# Patient Record
Sex: Female | Born: 1970 | Hispanic: Yes | Marital: Married | Smoking: Never smoker
Health system: Southern US, Community
[De-identification: ages and names within clinical notes are randomized; demographics above are authoritative.]

## PROBLEM LIST (undated history)

## (undated) DIAGNOSIS — G932 Benign intracranial hypertension: Secondary | ICD-10-CM

## (undated) DIAGNOSIS — R7611 Nonspecific reaction to tuberculin skin test without active tuberculosis: Secondary | ICD-10-CM

## (undated) DIAGNOSIS — H538 Other visual disturbances: Secondary | ICD-10-CM

## (undated) DIAGNOSIS — E079 Disorder of thyroid, unspecified: Secondary | ICD-10-CM

## (undated) DIAGNOSIS — B977 Papillomavirus as the cause of diseases classified elsewhere: Secondary | ICD-10-CM

## (undated) DIAGNOSIS — A64 Unspecified sexually transmitted disease: Secondary | ICD-10-CM

## (undated) DIAGNOSIS — C539 Malignant neoplasm of cervix uteri, unspecified: Secondary | ICD-10-CM

## (undated) DIAGNOSIS — K5792 Diverticulitis of intestine, part unspecified, without perforation or abscess without bleeding: Secondary | ICD-10-CM

## (undated) DIAGNOSIS — R896 Abnormal cytological findings in specimens from other organs, systems and tissues: Secondary | ICD-10-CM

## (undated) HISTORY — DX: Abnormal cytological findings in specimens from other organs, systems and tissues: R89.6

## (undated) HISTORY — DX: Diverticulitis of intestine, part unspecified, without perforation or abscess without bleeding: K57.92

## (undated) HISTORY — DX: Unspecified sexually transmitted disease: A64

## (undated) HISTORY — DX: Benign intracranial hypertension: G93.2

## (undated) HISTORY — DX: Other visual disturbances: H53.8

## (undated) HISTORY — DX: Malignant neoplasm of cervix uteri, unspecified: C53.9

## (undated) HISTORY — DX: Disorder of thyroid, unspecified: E07.9

## (undated) HISTORY — DX: Morbid (severe) obesity due to excess calories: E66.01

## (undated) HISTORY — DX: Nonspecific reaction to tuberculin skin test without active tuberculosis: R76.11

## (undated) HISTORY — DX: Papillomavirus as the cause of diseases classified elsewhere: B97.7

---

## 1999-12-16 ENCOUNTER — Emergency Department (HOSPITAL_COMMUNITY): Admission: EM | Admit: 1999-12-16 | Discharge: 1999-12-17 | Payer: Self-pay

## 1999-12-28 ENCOUNTER — Encounter: Admission: RE | Admit: 1999-12-28 | Discharge: 1999-12-28 | Payer: Self-pay | Admitting: Family Medicine

## 1999-12-28 ENCOUNTER — Encounter: Payer: Self-pay | Admitting: Family Medicine

## 2000-04-26 ENCOUNTER — Encounter: Admission: RE | Admit: 2000-04-26 | Discharge: 2000-04-26 | Payer: Self-pay | Admitting: Family Medicine

## 2000-04-26 ENCOUNTER — Encounter: Payer: Self-pay | Admitting: Family Medicine

## 2000-05-23 ENCOUNTER — Encounter: Payer: Self-pay | Admitting: Family Medicine

## 2000-05-23 ENCOUNTER — Ambulatory Visit (HOSPITAL_COMMUNITY): Admission: RE | Admit: 2000-05-23 | Discharge: 2000-05-23 | Payer: Self-pay | Admitting: Family Medicine

## 2000-06-22 HISTORY — PX: CHOLECYSTECTOMY: SHX55

## 2000-06-27 ENCOUNTER — Emergency Department (HOSPITAL_COMMUNITY): Admission: EM | Admit: 2000-06-27 | Discharge: 2000-06-27 | Payer: Self-pay | Admitting: Emergency Medicine

## 2000-06-27 ENCOUNTER — Encounter: Payer: Self-pay | Admitting: Emergency Medicine

## 2000-07-10 ENCOUNTER — Encounter: Payer: Self-pay | Admitting: Family Medicine

## 2000-07-10 ENCOUNTER — Encounter: Admission: RE | Admit: 2000-07-10 | Discharge: 2000-07-10 | Payer: Self-pay | Admitting: Family Medicine

## 2000-07-11 ENCOUNTER — Encounter: Payer: Self-pay | Admitting: General Surgery

## 2000-07-12 ENCOUNTER — Encounter: Payer: Self-pay | Admitting: General Surgery

## 2000-07-12 ENCOUNTER — Ambulatory Visit (HOSPITAL_COMMUNITY): Admission: RE | Admit: 2000-07-12 | Discharge: 2000-07-13 | Payer: Self-pay | Admitting: General Surgery

## 2000-07-12 ENCOUNTER — Encounter (INDEPENDENT_AMBULATORY_CARE_PROVIDER_SITE_OTHER): Payer: Self-pay | Admitting: *Deleted

## 2000-07-26 ENCOUNTER — Encounter: Admission: RE | Admit: 2000-07-26 | Discharge: 2000-07-26 | Payer: Self-pay | Admitting: Sports Medicine

## 2000-08-13 ENCOUNTER — Encounter: Admission: RE | Admit: 2000-08-13 | Discharge: 2000-08-13 | Payer: Self-pay | Admitting: Family Medicine

## 2000-09-30 ENCOUNTER — Ambulatory Visit (HOSPITAL_COMMUNITY): Admission: RE | Admit: 2000-09-30 | Discharge: 2000-09-30 | Payer: Self-pay | Admitting: Gastroenterology

## 2000-10-20 ENCOUNTER — Emergency Department (HOSPITAL_COMMUNITY): Admission: EM | Admit: 2000-10-20 | Discharge: 2000-10-20 | Payer: Self-pay | Admitting: Emergency Medicine

## 2000-10-20 ENCOUNTER — Encounter: Payer: Self-pay | Admitting: Emergency Medicine

## 2000-11-01 ENCOUNTER — Emergency Department (HOSPITAL_COMMUNITY): Admission: EM | Admit: 2000-11-01 | Discharge: 2000-11-02 | Payer: Self-pay | Admitting: Emergency Medicine

## 2000-11-01 ENCOUNTER — Encounter: Payer: Self-pay | Admitting: Emergency Medicine

## 2000-11-21 ENCOUNTER — Other Ambulatory Visit: Admission: RE | Admit: 2000-11-21 | Discharge: 2000-11-21 | Payer: Self-pay | Admitting: *Deleted

## 2001-01-18 ENCOUNTER — Inpatient Hospital Stay (HOSPITAL_COMMUNITY): Admission: AD | Admit: 2001-01-18 | Discharge: 2001-01-18 | Payer: Self-pay | Admitting: Gynecology

## 2001-01-20 ENCOUNTER — Encounter (INDEPENDENT_AMBULATORY_CARE_PROVIDER_SITE_OTHER): Payer: Self-pay

## 2001-01-20 ENCOUNTER — Other Ambulatory Visit: Admission: RE | Admit: 2001-01-20 | Discharge: 2001-01-20 | Payer: Self-pay | Admitting: Gynecology

## 2001-06-18 ENCOUNTER — Emergency Department (HOSPITAL_COMMUNITY): Admission: EM | Admit: 2001-06-18 | Discharge: 2001-06-18 | Payer: Self-pay

## 2001-12-29 ENCOUNTER — Other Ambulatory Visit: Admission: RE | Admit: 2001-12-29 | Discharge: 2001-12-29 | Payer: Self-pay | Admitting: Gynecology

## 2002-04-18 ENCOUNTER — Encounter: Payer: Self-pay | Admitting: Sports Medicine

## 2002-04-18 ENCOUNTER — Encounter: Admission: RE | Admit: 2002-04-18 | Discharge: 2002-04-18 | Payer: Self-pay | Admitting: Sports Medicine

## 2002-06-11 ENCOUNTER — Encounter: Admission: RE | Admit: 2002-06-11 | Discharge: 2002-06-11 | Payer: Self-pay | Admitting: Family Medicine

## 2002-06-11 ENCOUNTER — Encounter: Payer: Self-pay | Admitting: Family Medicine

## 2002-12-29 ENCOUNTER — Other Ambulatory Visit: Admission: RE | Admit: 2002-12-29 | Discharge: 2002-12-29 | Payer: Self-pay | Admitting: Gynecology

## 2003-01-20 ENCOUNTER — Ambulatory Visit (HOSPITAL_COMMUNITY): Admission: RE | Admit: 2003-01-20 | Discharge: 2003-01-20 | Payer: Self-pay | Admitting: Gynecology

## 2003-01-20 ENCOUNTER — Encounter: Payer: Self-pay | Admitting: Gynecology

## 2003-07-24 HISTORY — PX: TUBAL LIGATION: SHX77

## 2003-12-14 ENCOUNTER — Ambulatory Visit (HOSPITAL_BASED_OUTPATIENT_CLINIC_OR_DEPARTMENT_OTHER): Admission: RE | Admit: 2003-12-14 | Discharge: 2003-12-14 | Payer: Self-pay | Admitting: Surgery

## 2003-12-14 ENCOUNTER — Ambulatory Visit (HOSPITAL_COMMUNITY): Admission: RE | Admit: 2003-12-14 | Discharge: 2003-12-14 | Payer: Self-pay | Admitting: Surgery

## 2003-12-14 ENCOUNTER — Encounter (INDEPENDENT_AMBULATORY_CARE_PROVIDER_SITE_OTHER): Payer: Self-pay | Admitting: Specialist

## 2004-01-25 ENCOUNTER — Other Ambulatory Visit: Admission: RE | Admit: 2004-01-25 | Discharge: 2004-01-25 | Payer: Self-pay | Admitting: Gynecology

## 2004-01-27 ENCOUNTER — Ambulatory Visit (HOSPITAL_BASED_OUTPATIENT_CLINIC_OR_DEPARTMENT_OTHER): Admission: RE | Admit: 2004-01-27 | Discharge: 2004-01-27 | Payer: Self-pay | Admitting: Gynecology

## 2004-01-27 ENCOUNTER — Ambulatory Visit (HOSPITAL_COMMUNITY): Admission: RE | Admit: 2004-01-27 | Discharge: 2004-01-27 | Payer: Self-pay | Admitting: Gynecology

## 2004-01-27 ENCOUNTER — Encounter (INDEPENDENT_AMBULATORY_CARE_PROVIDER_SITE_OTHER): Payer: Self-pay | Admitting: *Deleted

## 2004-10-30 ENCOUNTER — Encounter: Admission: RE | Admit: 2004-10-30 | Discharge: 2005-01-28 | Payer: Self-pay | Admitting: Family Medicine

## 2005-02-21 ENCOUNTER — Other Ambulatory Visit: Admission: RE | Admit: 2005-02-21 | Discharge: 2005-02-21 | Payer: Self-pay | Admitting: Gynecology

## 2005-07-04 ENCOUNTER — Encounter: Admission: RE | Admit: 2005-07-04 | Discharge: 2005-07-04 | Payer: Self-pay | Admitting: Family Medicine

## 2005-10-30 ENCOUNTER — Emergency Department (HOSPITAL_COMMUNITY): Admission: EM | Admit: 2005-10-30 | Discharge: 2005-10-30 | Payer: Self-pay | Admitting: Emergency Medicine

## 2005-12-12 ENCOUNTER — Emergency Department (HOSPITAL_COMMUNITY): Admission: EM | Admit: 2005-12-12 | Discharge: 2005-12-13 | Payer: Self-pay | Admitting: Emergency Medicine

## 2006-05-03 ENCOUNTER — Other Ambulatory Visit: Admission: RE | Admit: 2006-05-03 | Discharge: 2006-05-03 | Payer: Self-pay | Admitting: Gynecology

## 2006-05-16 ENCOUNTER — Ambulatory Visit: Payer: Self-pay | Admitting: Family Medicine

## 2006-05-16 LAB — CONVERTED CEMR LAB
ALT: 23 units/L (ref 0–40)
AST: 19 units/L (ref 0–37)
CO2: 27 meq/L (ref 19–32)
Chol/HDL Ratio, serum: 4.7
Cholesterol: 191 mg/dL (ref 0–200)
Creatinine, Ser: 0.6 mg/dL (ref 0.4–1.2)
LDL Cholesterol: 128 mg/dL — ABNORMAL HIGH (ref 0–99)
Total Bilirubin: 0.7 mg/dL (ref 0.3–1.2)
VLDL: 23 mg/dL (ref 0–40)

## 2006-06-05 ENCOUNTER — Ambulatory Visit: Payer: Self-pay | Admitting: Internal Medicine

## 2006-07-23 HISTORY — PX: LAPAROSCOPIC GASTRIC BANDING: SHX1100

## 2007-01-13 ENCOUNTER — Ambulatory Visit: Payer: Self-pay | Admitting: Internal Medicine

## 2007-01-14 ENCOUNTER — Encounter: Payer: Self-pay | Admitting: Internal Medicine

## 2007-02-03 ENCOUNTER — Ambulatory Visit: Payer: Self-pay | Admitting: Family Medicine

## 2007-02-03 ENCOUNTER — Telehealth (INDEPENDENT_AMBULATORY_CARE_PROVIDER_SITE_OTHER): Payer: Self-pay | Admitting: *Deleted

## 2007-02-03 DIAGNOSIS — Z9884 Bariatric surgery status: Secondary | ICD-10-CM | POA: Insufficient documentation

## 2007-02-03 DIAGNOSIS — E559 Vitamin D deficiency, unspecified: Secondary | ICD-10-CM | POA: Insufficient documentation

## 2007-02-03 LAB — CONVERTED CEMR LAB
Basophils Absolute: 0.1 10*3/uL (ref 0.0–0.1)
Basophils Relative: 1.2 % — ABNORMAL HIGH (ref 0.0–1.0)
Eosinophils Absolute: 0.1 10*3/uL (ref 0.0–0.6)
Folate: 12.3 ng/mL
Hemoglobin: 13.5 g/dL (ref 12.0–15.0)
Lymphocytes Relative: 29 % (ref 12.0–46.0)
MCHC: 33.9 g/dL (ref 30.0–36.0)
Magnesium: 2.1 mg/dL (ref 1.5–2.5)
Monocytes Relative: 4.5 % (ref 3.0–11.0)
Neutro Abs: 5.1 10*3/uL (ref 1.4–7.7)
Neutrophils Relative %: 64.3 % (ref 43.0–77.0)
Platelets: 270 10*3/uL (ref 150–400)
TSH: 3.26 microintl units/mL (ref 0.35–5.50)

## 2007-02-07 ENCOUNTER — Telehealth (INDEPENDENT_AMBULATORY_CARE_PROVIDER_SITE_OTHER): Payer: Self-pay | Admitting: *Deleted

## 2007-03-12 ENCOUNTER — Telehealth (INDEPENDENT_AMBULATORY_CARE_PROVIDER_SITE_OTHER): Payer: Self-pay | Admitting: *Deleted

## 2007-03-13 ENCOUNTER — Ambulatory Visit: Payer: Self-pay | Admitting: Family Medicine

## 2007-03-19 ENCOUNTER — Telehealth (INDEPENDENT_AMBULATORY_CARE_PROVIDER_SITE_OTHER): Payer: Self-pay | Admitting: Family Medicine

## 2007-04-07 ENCOUNTER — Other Ambulatory Visit: Admission: RE | Admit: 2007-04-07 | Discharge: 2007-04-07 | Payer: Self-pay | Admitting: Gynecology

## 2007-06-09 ENCOUNTER — Ambulatory Visit: Payer: Self-pay | Admitting: Family Medicine

## 2008-01-26 ENCOUNTER — Ambulatory Visit: Payer: Self-pay | Admitting: Internal Medicine

## 2008-01-26 DIAGNOSIS — S99929A Unspecified injury of unspecified foot, initial encounter: Secondary | ICD-10-CM

## 2008-01-26 DIAGNOSIS — S99919A Unspecified injury of unspecified ankle, initial encounter: Secondary | ICD-10-CM

## 2008-01-26 DIAGNOSIS — S8990XA Unspecified injury of unspecified lower leg, initial encounter: Secondary | ICD-10-CM | POA: Insufficient documentation

## 2008-04-16 ENCOUNTER — Other Ambulatory Visit: Admission: RE | Admit: 2008-04-16 | Discharge: 2008-04-16 | Payer: Self-pay | Admitting: Gynecology

## 2008-04-16 ENCOUNTER — Ambulatory Visit: Payer: Self-pay | Admitting: Gynecology

## 2008-04-16 ENCOUNTER — Encounter: Payer: Self-pay | Admitting: Gynecology

## 2008-05-24 ENCOUNTER — Ambulatory Visit: Payer: Self-pay | Admitting: Gynecology

## 2008-05-27 ENCOUNTER — Encounter: Admission: RE | Admit: 2008-05-27 | Discharge: 2008-05-27 | Payer: Self-pay | Admitting: Gynecology

## 2008-06-08 ENCOUNTER — Encounter: Admission: RE | Admit: 2008-06-08 | Discharge: 2008-06-08 | Payer: Self-pay | Admitting: Gynecology

## 2008-06-09 ENCOUNTER — Ambulatory Visit: Payer: Self-pay | Admitting: Internal Medicine

## 2008-06-09 LAB — CONVERTED CEMR LAB: Vit D, 1,25-Dihydroxy: 10 — ABNORMAL LOW (ref 30–89)

## 2008-06-15 LAB — CONVERTED CEMR LAB
Basophils Relative: 0 % (ref 0.0–3.0)
Chloride: 104 meq/L (ref 96–112)
Cholesterol: 199 mg/dL (ref 0–200)
Creatinine, Ser: 0.6 mg/dL (ref 0.4–1.2)
GFR calc non Af Amer: 120 mL/min
Glucose, Bld: 69 mg/dL — ABNORMAL LOW (ref 70–99)
HCT: 40.7 % (ref 36.0–46.0)
Hemoglobin: 13.8 g/dL (ref 12.0–15.0)
LDL Cholesterol: 123 mg/dL — ABNORMAL HIGH (ref 0–99)
Monocytes Absolute: 0.1 10*3/uL (ref 0.1–1.0)
Monocytes Relative: 1.6 % — ABNORMAL LOW (ref 3.0–12.0)
Neutrophils Relative %: 71 % (ref 43.0–77.0)
Platelets: 244 10*3/uL (ref 150–400)
TSH: 3.45 microintl units/mL (ref 0.35–5.50)
Triglycerides: 138 mg/dL (ref 0–149)
Vitamin B-12: 516 pg/mL (ref 211–911)
WBC: 8.8 10*3/uL (ref 4.5–10.5)

## 2008-07-12 ENCOUNTER — Ambulatory Visit: Payer: Self-pay | Admitting: Family Medicine

## 2008-07-12 DIAGNOSIS — H6690 Otitis media, unspecified, unspecified ear: Secondary | ICD-10-CM | POA: Insufficient documentation

## 2008-10-06 ENCOUNTER — Other Ambulatory Visit: Admission: RE | Admit: 2008-10-06 | Discharge: 2008-10-06 | Payer: Self-pay | Admitting: Gynecology

## 2008-10-06 ENCOUNTER — Encounter: Payer: Self-pay | Admitting: Gynecology

## 2008-10-06 ENCOUNTER — Ambulatory Visit: Payer: Self-pay | Admitting: Gynecology

## 2009-05-27 ENCOUNTER — Ambulatory Visit: Payer: Self-pay | Admitting: Internal Medicine

## 2009-07-23 HISTORY — PX: SALPINGECTOMY: SHX328

## 2009-10-24 ENCOUNTER — Ambulatory Visit: Payer: Self-pay | Admitting: Gynecology

## 2010-05-29 ENCOUNTER — Ambulatory Visit: Payer: Self-pay | Admitting: Women's Health

## 2010-06-05 ENCOUNTER — Ambulatory Visit: Payer: Self-pay | Admitting: Gynecology

## 2010-06-06 ENCOUNTER — Other Ambulatory Visit: Payer: Self-pay | Admitting: Gynecology

## 2010-06-06 ENCOUNTER — Ambulatory Visit (HOSPITAL_COMMUNITY): Admission: RE | Admit: 2010-06-06 | Discharge: 2010-06-06 | Payer: Self-pay | Admitting: Gynecology

## 2010-06-06 ENCOUNTER — Ambulatory Visit: Payer: Self-pay | Admitting: Gynecology

## 2010-06-20 ENCOUNTER — Ambulatory Visit: Payer: Self-pay | Admitting: Gynecology

## 2010-08-01 ENCOUNTER — Ambulatory Visit
Admission: RE | Admit: 2010-08-01 | Discharge: 2010-08-01 | Payer: Self-pay | Source: Home / Self Care | Attending: Gynecology | Admitting: Gynecology

## 2010-08-13 ENCOUNTER — Encounter: Payer: Self-pay | Admitting: Family Medicine

## 2010-08-13 ENCOUNTER — Encounter: Payer: Self-pay | Admitting: Gynecology

## 2010-10-03 LAB — CBC
MCV: 95.9 fL (ref 78.0–100.0)
Platelets: 250 10*3/uL (ref 150–400)
RDW: 13.8 % (ref 11.5–15.5)

## 2010-10-03 LAB — SURGICAL PCR SCREEN
MRSA, PCR: NEGATIVE
Staphylococcus aureus: NEGATIVE

## 2010-10-03 LAB — ABO/RH: ABO/RH(D): O POS

## 2010-12-08 NOTE — Op Note (Signed)
NAME:  Samantha Smith, Samantha Smith                        ACCOUNT NO.:  [id number]   MEDICAL RECORD NO.:  [id number]                   PATIENT TYPE:  AMB   LOCATION:  [facility]                                 FACILITY:  [facility]   PHYSICIAN:  Timothy P. Fontaine, M.D.           DATE OF BIRTH:  August 14, 1970   DATE OF PROCEDURE:  01/27/2004  DATE OF DISCHARGE:                                 OPERATIVE REPORT   PREOPERATIVE DIAGNOSES:  1. Pelvic adhesive disease.  2. Right hydrosalpinx.   POSTOPERATIVE DIAGNOSES:  1. Chronic pelvic inflammatory disease.  2. Bilateral adnexal adhesive disease.  3. Right hydrosalpinx.   PROCEDURES:  1. Laparoscopic lysis of adhesions.  2. Excision, left adnexal inflammatory cyst.  3. Bipolar cautery, proximal right fallopian tube.  4. Lysis of adhesions.   SURGEON:  Timothy P. Fontaine, M.D.   ASSISTANT:  Rande Brunt. Gottsegen, M.D.   ESTIMATED BLOOD LOSS:  Minimal.   COMPLICATIONS:  None.   SPECIMENS:  Inflammatory cysts.   FINDINGS:  Anterior cul-de-sac normal.  Posterior cul-de-sac normal.  Uterus  grossly normal size, shape, contour.  Right fallopian tube distended,  consistent with hydrosalpinx, distal one-half encased in thick adnexal  adhesions to the sidewall.  Ovary contained within this adhesive mass.  No  ovary visualized.  Left fallopian tube normal length and caliber, adherent  to the left pelvic sidewall.  Prohibited distal third inspection.  Manipulation did reveal tuft of normal-appearing fimbriae from the distal  portion.  Ovary obscured by the fallopian tube not visualized.  Two areas of  inflammatory cysts noted, which were excised.  No evidence of endometriosis.  Upper abdominal exam is normal.  Appendix attempted visualization  unsuccessful.  Liver smooth, no abnormalities, no perihepatic adhesions.  Gallbladder not visualized.   PROCEDURE:  The patient was taken to the operating room, underwent general  endotracheal anesthesia, was placed  in the low dorsal lithotomy position and  received an abdominal, perineal, and vaginal preparation with Betadine  solution, the bladder emptied with in-and-out Foley catheterization, EUA  performed, and a Hulka tenaculum was placed on the cervix.  The patient was  draped in the usual fashion.  The vertical infraumbilical incision was made  and several attempts to place the Veress needle were made with high pressure  readings returned.  It was felt that we were extraperitoneal.  The  subcutaneous tissues were dissected with a hemostat to the level of the  fascia and using the 10 mm laparoscopic trocar, a direct entry was made into  the peritoneal cavity, which was verified visually, and subsequently the  peritoneal cavity was insufflated.  Right and left suprapubic 5 mm ports  were then placed under direct visualization without difficulty.  Transillumination of the vessels pre-placement was not possible due to the  subcutaneous adipose tissue.  Examination of the pelvic organs and upper  abdominal exam were carried out with findings noted above.  Initial  inspection of  the left adnexa showed firm adherence to the left pelvic  sidewall.  There were two obvious inflammatory cysts emanating from the  sidewall, and both of these were excised and sent to pathology.  Further  attempts to free the adnexa were difficult due to the thick adhesions as  well as the case was hampered from a technical nature due to the  preperitoneal adipose tissue, epiploica, and omental abundance, and the  patient was not able to be placed in significant Trendelenburg due to  difficulty with ventilation from anesthesia when she was in initial attempts  at Trendelenburg.  Further attempts to free the left adnexa were abandoned.  Although we were unable to visualize the ovary, we were able to visualize  normal-appearing areas and tufts of fimbriate, and it was felt that further  dissection not only would put underlying  structures such as vessels and  ureter at risk but would potentially lead to further adhesion and scarring  on this side.  Inspection of the right adnexa showed an obvious right  hydrosalpinx, the distal portion of which was involved in an inflammatory  mass along the right pelvic sidewall.  The ovary was again not visualized,  and there was epiploica adherent to the inflammatory mass.  Initial attempts  were made to initiate lysis of adhesions to free up the right adnexa to  allow ovarian inspection as well as possible salpingectomy, but it was  quickly evident that the adhesive disease was significant and the risk of  sidewall structure injury to include vessel or ureter would be substantial  if dissection was pursued.  At this point it was decided to proceed with  proximal tubal obstruction with bipolar and abandon attempts at  salpingectomy or salpingo-oophorectomy.  This possibility had been  previously discussed with the patient and her husband, and both were in  agreement that if significant dissection would be required that they would  agree with a proximal tubal cauterization.  The proximal right fallopian  tube was then bipolar cauterized in several passes to a flow of 0.  The  pelvis was then copiously irrigated, adequate hemostasis was visualized, the  suprapubic ports were removed, the gas allowed to escape, all port sites  inspected under low-pressure situation showing adequate hemostasis.  The  infraumbilical port was then backed out under direct visualization, showing  adequate hemostasis and no evidence of hernia formation.  A 0 Vicryl  subcutaneous stitch was placed for closure of the dead space and  subsequently all skin incisions were infiltrated using 0.25% Marcaine and  closed with Dermabond skin adhesive.  The Hulka tenaculum was removed, patient placed in supine position, awakened without difficulty, and taken to  the recovery room in good condition, having tolerated  the procedure well.                                               Timothy P. Audie Box, M.D.    TPF/MEDQ  D:  01/27/2004  T:  01/27/2004  Job:  [id number]

## 2010-12-08 NOTE — H&P (Signed)
NAME:  Samantha Smith, Samantha Smith                        ACCOUNT NO.:  [id number]   MEDICAL RECORD NO.:  [id number]                   PATIENT TYPE:  AMB   LOCATION:  [facility]                                 FACILITY:  [facility]   PHYSICIAN:  Timothy P. Fontaine, M.D.           DATE OF BIRTH:  23-Jan-1971   DATE OF ADMISSION:  DATE OF DISCHARGE:                                HISTORY & PHYSICAL   DATE OF SURGERY:  Patient is scheduled for surgery Thursday, January 27, 2004 at  1:00 P.M. at [facility].   CHIEF COMPLAINT:  Hydrosalpinx.   HISTORY OF PRESENT ILLNESS:  The patient is a 40 year old G2, P0, AB2 female  with history of hydrosalpinx, secondary infertility for laparoscopy.  The  patient has a history of two spontaneous abortions in the past, has a  history of Chlamydia and secondary infertility.  Patient ultimately  underwent an HSG which showed a normal endometrial cavity, left fallopian  tube is patent, occluded right fallopian tube with hydrosalpinx.  The  patient has had two separate consultations both with [facility] as  well as [facility], both of whom  suggest removing her hydrosalpinx or at least occluding it to prevent  retrograde spillage into the endometrium of fluid which may interfere with  pregnancy as well as for pelvic assessment to optimize pregnancy trial.  The  patient is admitted at this time for laparoscopy and salpingectomy.   PAST MEDICAL HISTORY:  Significant for thyroid dysfunction.   PAST SURGICAL HISTORY:  Includes cholecystectomy.   MEDICATIONS:  Synthroid 100 mcg daily.   ALLERGIES:  No known drug allergies.   REVIEW OF SYMPTOMS:  Noncontributory.   FAMILY HISTORY:  Noncontributory.   SOCIAL HISTORY:  Noncontributory.   PHYSICAL EXAMINATION:  VITAL SIGNS:  Afebrile.  Vital signs are stable.  HEENT:  Normal.  LUNGS:  Clear.  CARDIAC:  Regular rate and rhythm without murmurs, rubs or gallops.  ABDOMEN:  Examination is benign.  PELVIC:  External BUS, vagina normal.  Cervix normal.  Uterus grossly normal  in size, nontender.  Adnexa without masses or tenderness.   ASSESSMENT/PLAN:  The patient is a 40 year old G2, P0, AB2 female secondary  infertility, Hysterosonogram suggestive of hydrosalpinx on the right for  laparoscopic evaluation and removal of hydrosalpinx and/or occlusion of the  hydrosalpinx.  The risks, benefits, indications and alternatives for the  surgery were discussed with her and her husband to include the expected  intraoperative and postoperative courses.  Insufflation, trocar placement,  use of sharp, blunt dissection, electrocautery and laser were all reviewed  with them.  The patient understands there are no guarantees as far as  pregnancy following the procedure and that she may not achieve pregnancy  despite what we do during the procedure.  She also understands that if  significant disease is encountered and it is felt unsafe to proceed with  surgery either removing the  tube or occluding the tube for fear of vascular  or ureteral damage, we may abandon the procedure at that point and she may  be left with significant disease but that we will not proceed with an  exploratory laparotomy unless complications necessitate this.  The risks of  the procedure were reviewed the include the risk of infection both internal  requiring prolonged antibiotics as well as incisional requiring opening and  draining of incisions.  The risk of vascular injury, bleeding to hemorrhage  necessitating transfusions and the risk of transfusions including  transfusion reaction, hepatitis, HIV, mad cow disease and other unknown  entities was all discussed, understood and accepted.  The risks of internal  organ damage including bowel, bladder, ureters, vessels and nerves was all  reviewed with her and the potential for major reparative surgeries including  exploratory laparotomy,  ostomy formation and future surgeries to complete  repairs was all discussed, understood and accepted.  The patient's questions  were answered to her satisfaction and she is ready to proceed with surgery.                                               Timothy P. Audie Box, M.D.    TPF/MEDQ  D:  01/26/2004  T:  01/26/2004  Job:  [id number]

## 2010-12-08 NOTE — Op Note (Signed)
[place]  Patient:    Samantha Smith, Samantha Smith                     MRN: [id number] Proc. Date: 07/12/00 Adm. Date:  [id number] Disc. Date: [id number] Attending:  Henrene Dodge                           Operative Report  PREOPERATIVE DIAGNOSIS:   Cholecystitis.  POSTOPERATIVE DIAGNOSIS:  Chronic cholecystitis, await final pathology. Could not see stones.  OPERATION:  Laparoscopic cholecystectomy and cholangiogram.  SURGEON:  Anselm Pancoast. Zachery Dakins, M.D.  ASSISTANT:  Chevis Pretty, M.D.  ANESTHESIA:  General.  INDICATIONS:  Samantha Smith is a 40 year old Mexican-American who presented to our office yesterday after being referred to the emergency room where she has been evaluated with severe upper abdominal pain and after extensive work-up was noted to have stones on a laparoscopic ultrasound.  The patient desired to proceed promptly with surgery even though on physical findings she was not acutely tender in the right upper quadrant or febrile but states that she had these recurrent episodes and wanted to proceed promptly with surgery before she had another episode.  I added her on to the OR schedule today.  Her liver function studies in the ER have been normal.  White count has been slightly elevated at 11,800.  DESCRIPTION OF PROCEDURE:  The patient was taken to the operative suite.  She has PAS stockings.  She had been given 3 g of Unasyn and induction of general anesthesia.  She was quite short, very heavy.  The abdomen was prepped with Betadine scrub and solution and draped in a sterile manner.  A small vertical incision was made below the umbilicus.  Sharp dissection down through the fascia which was then picked up between Kochers and a small opening made.  The underlying peritoneum was identified and a hemostat carefully placed through it.  Traction suture was placed in the upper abdomen and then the Hasson cannula introduced.  The  gallbladder was tense and swollen but not acutely erythematous.  The upper 10 mm trocar was placed under direct vision and two lateral 5 mm trocars were placed by Dr. Carolynne Edouard who was the surgical assistant. The gallbladder was retracted upward and outward.  The exposure was difficult because of her short stature and heavy nature but we could visualize the proximal portion of the gallbladder and this was kind of teased out.  It encompassed the cystic duct which was clipped distally and the cystic artery was likewise visualized, and this was doubly clipped proximally, singly distally.  Next the taut catheter with the tip cut off was placed in the proximal cystic duct and held in place for a clip and then a cholangiogram was obtained.  There was good prompt filling of the common bile duct, good flow into the duodenum and the intrahepatic ________.  No evidence of any common duct stones.  The catheter was removed.  The cystic duct proximal was triply clipped and then divided as was the cystic artery divided distal to the two proximal clips.  The gallbladder was then freed from its bed.  The posterior branch of the artery was identified and this was clipped and on traction of the gallbladder we did spill the bile but it was tented up sort of an intrahepatic portion that we dissected out with cautery.  I placed the gallbladder Endo-catch bag and when the  bile was coming out there was a lot of thick crystals in the center but I could not see any definite stones.  The gallbladder bed was inspected and there was good hemostasis, and then the camera was placed in the upper 10 mm port and the bag containing the gallbladder was drawn through the abdominal wall.  As far as filling any definite stones in the bag, I could not feel any definite and all of the contents were sent to pathology.  The gallbladder fossa was reinspected.  Good hemostasis.  Two lateral 5 mm ports were then withdrawn.  I  inspected carefully down to right lower quadrant and could see the appendix, and the appendix was definitely not acutely inflamed.  There were no other abnormalities noted within the pelvis and the umbilical defect was closed with two figure-of-eights of 0 Vicryl, and the the 5 mm ports were withdrawn and and carbon dioxide removed.  CO2 fluid had been aspirated and then the gallbladder upper 10 mm trocar was withdrawn.  The subcutaneous wounds were closed with 2-0 Vicryl and then Steri-Strips were used to close the skin . The patient tolerated the procedure nicely and was sent to the recovery room, extubated in satisfactory postoperative condition.  I had placed Marcaine in all of the port sites. DD:  07/12/00 TD:  07/14/00 Job: [id number]

## 2010-12-08 NOTE — Op Note (Signed)
NAME:  Samantha Smith, Samantha Smith                        ACCOUNT NO.:  [id number]   MEDICAL RECORD NO.:  [id number]                   PATIENT TYPE:  AMB   LOCATION:  [facility]                                  FACILITY:  [facility]   PHYSICIAN:  Sandria Bales. Ezzard Standing, M.D.               DATE OF BIRTH:  1971-05-25   DATE OF PROCEDURE:  12/14/2003  DATE OF DISCHARGE:                                 OPERATIVE REPORT   PREOPERATIVE DIAGNOSES:  2 cm cyst left axilla.   POSTOPERATIVE DIAGNOSES:  2 cm cyst left axilla.   PROCEDURE:  Excision of left axillary cyst.   SURGEON:  Sandria Bales. Ezzard Standing, M.D.   ANESTHESIA:  8 mL of 1% Xylocaine with epinephrine.   INDICATIONS FOR PROCEDURE:  [name] is a 40 year old Hispanic female  whose had an abscess of her left axilla secondary to an infected sebaceous  cyst. She now comes for excision of this residual cyst which measures about  1 1/2 to 2 cm.   The patient in the supine position with her arm out lateral. The left axilla  was prepped with Betadine solution and infiltrated with 8 mL of 1% Xylocaine  and then an elliptical incision was made excising the cyst from the left  axilla.  The skin was then closed with interrupted 3-0 nylon sutures and  then serially dressed. The patient will be discharged home today, return to  see me in eight days for suture removal, call for any interval problem.                                               Sandria Bales. Ezzard Standing, M.D.    DHN/MEDQ  D:  12/14/2003  T:  12/14/2003  Job:  [id number]

## 2010-12-08 NOTE — Procedures (Signed)
[facility]  Patient:    Samantha Smith, Samantha Smith                     MRN: [id number] Proc. Date: 09/30/00 Adm. Date:  [id number] Attending:  Orland Mustard CC:         Neta Mends. Panosh, M.D. [facility]   Procedure Report  PROCEDURE:  Esophagogastroduodenoscopy.  MEDICATIONS:  Fentanyl 62.5 mcg, Versed 7 mg, cetacaine spray.  INDICATIONS FOR PROCEDURE:  Upper abdominal pain that seems to resolve somewhat with Protonix but still somewhat symptomatic. Ultrasound and CT have been okay. The patient is post cholecystectomy.  DESCRIPTION OF PROCEDURE:  The procedure had been explained to the patient and consent obtained. With the patient in the left lateral decubitus position, the Olympus video endoscope was inserted blindly in the esophagus and advanced under direct visualization. The stomach was entered and pylorus identified and passed. The duodenum including the bulb and second portion were seen well. The scope was withdrawn back into the stomach. The pyloric channel was normal. The antrum and body were seen well and were normal. The fundus and cardia were seen in the retroflexed view and were normal. There was a 2-3 cm hiatal hernia with widely patent gastroesophageal junction. No gross esophagitis or ulceration. The scope was withdrawn. No other lesions were seen in the esophagus. The patient tolerated the procedure well.  ASSESSMENT:  Hiatal hernia with no abnormalities in the esophagus. I suspect she has gastroesophageal reflux disease.  PLAN:  Will continue to treat for reflux and give reflux sheet. Continue Protonix and see back in my office in six weeks. DD:  09/30/00 TD:  10/01/00 Job: [id number]

## 2011-02-05 ENCOUNTER — Telehealth: Payer: Self-pay | Admitting: *Deleted

## 2011-02-05 NOTE — Telephone Encounter (Signed)
Pt spouse called noting that the pt is having dizziness x 1 week (no hx of vertigo) and head pain x2 weeks. He denies chest pain, N&V, fever, etc. I advised him that the first available appt would be for tomorrow or pt could go to ER/UC. Spouse preferred appt for tomorrow.

## 2011-02-05 NOTE — Telephone Encounter (Signed)
Pts spouse is aware, states he would advise his wife. Could not say if she would go or not.

## 2011-02-05 NOTE — Telephone Encounter (Signed)
I think she better go today to be checked, ER is  appropriate, she likely needs a CT of the head

## 2011-02-06 ENCOUNTER — Encounter: Payer: Self-pay | Admitting: Internal Medicine

## 2011-02-06 ENCOUNTER — Ambulatory Visit (INDEPENDENT_AMBULATORY_CARE_PROVIDER_SITE_OTHER): Payer: PRIVATE HEALTH INSURANCE | Admitting: Internal Medicine

## 2011-02-06 VITALS — BP 132/76 | HR 70 | Temp 98.3°F | Wt 248.6 lb

## 2011-02-06 DIAGNOSIS — R519 Headache, unspecified: Secondary | ICD-10-CM | POA: Insufficient documentation

## 2011-02-06 DIAGNOSIS — R51 Headache: Secondary | ICD-10-CM

## 2011-02-06 DIAGNOSIS — R202 Paresthesia of skin: Secondary | ICD-10-CM | POA: Insufficient documentation

## 2011-02-06 DIAGNOSIS — R209 Unspecified disturbances of skin sensation: Secondary | ICD-10-CM

## 2011-02-06 DIAGNOSIS — R55 Syncope and collapse: Secondary | ICD-10-CM

## 2011-02-06 LAB — COMPREHENSIVE METABOLIC PANEL
Albumin: 4 g/dL (ref 3.5–5.2)
Alkaline Phosphatase: 77 U/L (ref 39–117)
CO2: 29 mEq/L (ref 19–32)
Calcium: 8.9 mg/dL (ref 8.4–10.5)
GFR: 130.59 mL/min (ref >60.00)
Glucose, Bld: 73 mg/dL (ref 70–99)
Total Protein: 7 g/dL (ref 6.0–8.3)

## 2011-02-06 LAB — CBC WITH DIFFERENTIAL/PLATELET
Basophils Relative: 0.5 % (ref 0.0–3.0)
Eosinophils Relative: 1.4 % (ref 0.0–5.0)
HCT: 39.5 % (ref 36.0–46.0)
MCHC: 33.9 g/dL (ref 30.0–36.0)
MCV: 95.1 fl (ref 78.0–100.0)
Monocytes Relative: 3.9 % (ref 3.0–12.0)
Platelets: 259 10*3/uL (ref 150.0–400.0)

## 2011-02-06 LAB — VITAMIN B12: Vitamin B-12: 421 pg/mL (ref 211–911)

## 2011-02-06 LAB — TSH: TSH: 1.33 u[IU]/mL (ref 0.35–5.50)

## 2011-02-06 NOTE — Progress Notes (Signed)
Subjective:    Patient ID: Samantha Smith, female    DOB: 1970-09-02, 40 y.o.   MRN: [id number]  HPI Here with her husband, symptoms started 3 weeks ago which moderate to severe sharp pain located at the right side of the head, right sinus area and right neck. Since then, the area is tingly/uncomfortable/irritated. Also still some nights the pain comes back although not as severe as the initial episode. Additionally, 5 times in the last 3 weeks she has felt near fainty: Bilateral visual disturbance (everything seems black), weak, dizzy. No actual LOC.Some  Palpitations, no anxiety but felt afraid d/t sx. Symptoms were more noticeable during her period on July 7. Additionally, she saw a diet doctor May 2012, labs were drawn, she got to 4 injections of hCG as well as thyroid supplementation which she took for about 6 weeks. No longer taking any Synthroid.  Past Medical History  Diagnosis Date  . Positive PPD     s/p abx per patient   Past Surgical History  Procedure Date  . Cholecystectomy 12/01  . Right fallopian tube tied     RIGHT  . Laparoscopic gastric banding 2008    see OV 06/09/08- ?Malfunction  . Ectopic pregnancy surgery     LEFT    Review of Systems Denies neck pain, fevers or any rash in the neck or head. Admits to a lot of stress (emotional) Denies runny nose, sore throat, nasal discharge or cough. Denies chest pain,   shortness of breath at the time of near-syncope. There is no associated slurred speech, motor or face deficits at the time of the near-syncope.     Objective:   Physical Exam  Constitutional: She is oriented to person, place, and time. She appears well-developed. No distress.       Overweight appearing  HENT:  Head: Normocephalic and atraumatic.  Right Ear: External ear normal.  Left Ear: External ear normal.  Mouth/Throat: No oropharyngeal exudate.       Not tender to palpation in the maxillary areas  Eyes: Conjunctivae and EOM are normal.  Pupils are equal, round, and reactive to light.  Neck: No thyromegaly present.       Normal carotid pulses  Cardiovascular: Normal rate, regular rhythm and normal heart sounds.   No murmur heard. Pulmonary/Chest: Effort normal and breath sounds normal. No respiratory distress. She has no wheezes. She has no rales.  Abdominal: Soft. Bowel sounds are normal. She exhibits no distension. There is no tenderness. There is no rebound.  Musculoskeletal: She exhibits no edema.  Neurological: She is alert and oriented to person, place, and time. No cranial nerve deficit.       Speech, gait, motor and DTRs symmetric and normal  Skin: Skin is warm and dry. She is not diaphoretic.  Psychiatric: She has a normal mood and affect. Her behavior is normal. Judgment and thought content normal.          Assessment & Plan:  Patient presents with the following symptoms 1. Right-sided headache, paresthesias right face 2. Near-syncope 3. Increase emotional distress 4. Recent use of  hCG and thyroid medication.  Symptoms at this point are of unclear etiology. There is no sinusitis on clinical grounds, neurological exam is normal, EKG today showed normal sinus with PACs. No acute changes. Differential diagnoses is large but includes include migraines, cluster headaches, trigeminal neuralgia, temporal artery arteritis, stroke, anxiety, side effects from hCG (according to "up to date", HCG may cause  headaches) Plan: Labs  MRI (needs open one) Neurology referral---------------------addendum, to see the HA specialist  In few days (August 1st) ER if symptoms severe or getting worse . Avoid hCG or thyroid supplements. If TSH is out of range, will a few weeks and recheck things she just finished a course of a thyroid supplement

## 2011-02-06 NOTE — Patient Instructions (Signed)
ER if symptoms severe 

## 2011-02-08 ENCOUNTER — Telehealth: Payer: Self-pay | Admitting: *Deleted

## 2011-02-08 NOTE — Telephone Encounter (Signed)
Message copied by Leanne Lovely on Thu Feb 08, 2011  2:01 PM ------      Message from: Willow Ora E      Created: Thu Feb 08, 2011 12:57 PM       Advise patient:      All labs within normal except for low vitamin D.      Please fax results to neurology, she will be seen at the [facility] soon      Also rec ergocalciferol 50,000 u weekly x 3 months, call Rx #12 tablets, no rf

## 2011-02-08 NOTE — Telephone Encounter (Signed)
Message left for patient to return my call.  

## 2011-02-09 ENCOUNTER — Telehealth: Payer: Self-pay | Admitting: Internal Medicine

## 2011-02-09 NOTE — Telephone Encounter (Signed)
Xanax 0.5 mg one or two 30 minutes before the MRI. It will  make her sleepy, needs a  driver. Call #[phone], no refills

## 2011-02-09 NOTE — Telephone Encounter (Signed)
Left message for pt to call first thing Monday am.

## 2011-02-09 NOTE — Telephone Encounter (Signed)
Patient is scheduled in the Open MRI for 02-12-11, however when I informed patient of her appointment, she states she will still need medication for claustrophobia.  Patient also requesting phone call from CMA to tell her what to expect from medication.

## 2011-02-09 NOTE — Telephone Encounter (Signed)
Message left for patient to return my call.  

## 2011-02-12 ENCOUNTER — Other Ambulatory Visit: Payer: PRIVATE HEALTH INSURANCE

## 2011-02-12 NOTE — Telephone Encounter (Signed)
Message left for patient to return my call.  

## 2011-02-13 NOTE — Telephone Encounter (Signed)
Message left for patient to return my call.  

## 2011-02-14 ENCOUNTER — Encounter: Payer: Self-pay | Admitting: *Deleted

## 2011-02-14 MED ORDER — ERGOCALCIFEROL 1.25 MG (50000 UT) PO CAPS: 50000.0000 [IU] | ORAL_CAPSULE | ORAL | Status: AC

## 2011-02-14 NOTE — Telephone Encounter (Signed)
Will mail pt a letter and copy of labs and rx.

## 2011-07-31 ENCOUNTER — Ambulatory Visit (INDEPENDENT_AMBULATORY_CARE_PROVIDER_SITE_OTHER): Payer: PRIVATE HEALTH INSURANCE | Admitting: Internal Medicine

## 2011-07-31 VITALS — BP 100/82 | HR 76 | Temp 97.6°F | Ht 61.75 in | Wt 261.0 lb

## 2011-07-31 DIAGNOSIS — K529 Noninfective gastroenteritis and colitis, unspecified: Secondary | ICD-10-CM

## 2011-07-31 DIAGNOSIS — K5289 Other specified noninfective gastroenteritis and colitis: Secondary | ICD-10-CM

## 2011-07-31 MED ORDER — PROMETHAZINE HCL 12.5 MG PO TABS: 12.5000 mg | ORAL_TABLET | Freq: Four times a day (QID) | ORAL | Status: AC | PRN

## 2011-07-31 MED ORDER — HYOSCYAMINE SULFATE CR 0.375 MG PO CP12: 0.3750 mg | ORAL_CAPSULE | Freq: Two times a day (BID) | ORAL | Status: DC | PRN

## 2011-07-31 NOTE — Patient Instructions (Signed)
Do a bland diet Take Phenergan for nausea, Pepto-Bismol for diarrhea and levsin for cramps. Some of the medicine may cause sedation, be careful. Call or go to the ER if you have severe symptoms, blood in the stools, unable to keep anything down, increased abdominal pain or fever.

## 2011-07-31 NOTE — Progress Notes (Signed)
  Subjective:    Patient ID: Samantha Smith, female    DOB: 02-05-1971, 41 y.o.   MRN: [id number]  HPI Acute visit. Sx started  last night with nausea, vomiting and diarrhea. Denies any hematemesis or blood in the stools, this morning she had vomiting immediate postprandial. She also has mid abdominal stomach cramps. The only thing she's doing different is thatshe started a new diet rich in vegetables and ate some beans   Past Medical History  Diagnosis Date  . Positive PPD     s/p abx per patient   Past Surgical History  Procedure Date  . Cholecystectomy 12/01  . Right fallopian tube tied     RIGHT  . Laparoscopic gastric banding 2008    see OV 06/09/08- ?Malfunction  . Ectopic pregnancy surgery     LEFT     Review of Systems No fever, mild chills. No respiratory symptoms like runny nose sore throat or cough. No heartburn per se but the fluid she vomited is acid. Does not feel distended in the abdomen    Objective:   Physical Exam  Constitutional: She is oriented to person, place, and time. She appears well-developed.       Overweight appearing, no apparent distress.  Eyes:       Not pale or jaundice  Cardiovascular: Normal rate, regular rhythm and normal heart sounds.   No murmur heard. Pulmonary/Chest: Effort normal and breath sounds normal. No respiratory distress. She has no wheezes. She has no rales.  Abdominal:       Not distended, slightly increased bowel sounds in all quadrants, mild midabdominal tenderness without mass or rebound.  Musculoskeletal: She exhibits no edema.  Neurological: She is alert and oriented to person, place, and time.  Psychiatric: She has a normal mood and affect. Her behavior is normal. Thought content normal.      Assessment & Plan:  Acute gastroenteritis: 41 year old lady with a history of bariatric surgery presents w/ nausea, vomiting and diarrhea. Does not have a GI obstruction on physical exam, symptoms are likely  gastroenteritis although other etiologies are possible. We'll try conservative treatment, see instructions, knows to call if symptoms increase, if that is the case she will need x-rays and further workup

## 2011-08-01 ENCOUNTER — Encounter: Payer: Self-pay | Admitting: Internal Medicine

## 2011-08-01 ENCOUNTER — Ambulatory Visit (INDEPENDENT_AMBULATORY_CARE_PROVIDER_SITE_OTHER): Payer: PRIVATE HEALTH INSURANCE

## 2011-08-01 DIAGNOSIS — E669 Obesity, unspecified: Secondary | ICD-10-CM

## 2011-08-01 DIAGNOSIS — R059 Cough, unspecified: Secondary | ICD-10-CM

## 2011-08-01 DIAGNOSIS — R05 Cough: Secondary | ICD-10-CM

## 2011-08-01 DIAGNOSIS — J029 Acute pharyngitis, unspecified: Secondary | ICD-10-CM

## 2011-08-01 DIAGNOSIS — J309 Allergic rhinitis, unspecified: Secondary | ICD-10-CM

## 2011-08-03 ENCOUNTER — Telehealth: Payer: Self-pay | Admitting: *Deleted

## 2011-08-03 NOTE — Telephone Encounter (Signed)
yes

## 2011-08-03 NOTE — Telephone Encounter (Signed)
Pharmacy faxed back advising ok to fill for tablets

## 2011-08-03 NOTE — Telephone Encounter (Signed)
Note from pharmacy indicated that levsinex 0.375 capsules has been discontinued is tablet ok.Please advise

## 2011-08-12 ENCOUNTER — Telehealth: Payer: Self-pay | Admitting: Internal Medicine

## 2011-08-12 NOTE — Telephone Encounter (Signed)
Please check on the patient, was recently seen with gastroenteritis. Improving?

## 2011-08-15 NOTE — Telephone Encounter (Signed)
Patient has improved & is doing well.

## 2012-07-23 HISTORY — PX: ABDOMINAL HYSTERECTOMY: SHX81

## 2012-07-27 ENCOUNTER — Emergency Department (HOSPITAL_COMMUNITY)
Admission: EM | Admit: 2012-07-27 | Discharge: 2012-07-27 | Disposition: A | Discharge status: Home / Self Care | Payer: Self-pay | Attending: Emergency Medicine | Admitting: Emergency Medicine

## 2012-07-27 ENCOUNTER — Encounter (HOSPITAL_COMMUNITY): Payer: Self-pay | Admitting: Emergency Medicine

## 2012-07-27 DIAGNOSIS — R112 Nausea with vomiting, unspecified: Secondary | ICD-10-CM | POA: Insufficient documentation

## 2012-07-27 DIAGNOSIS — R197 Diarrhea, unspecified: Secondary | ICD-10-CM | POA: Insufficient documentation

## 2012-07-27 DIAGNOSIS — L02419 Cutaneous abscess of limb, unspecified: Secondary | ICD-10-CM | POA: Insufficient documentation

## 2012-07-27 DIAGNOSIS — L03115 Cellulitis of right lower limb: Secondary | ICD-10-CM

## 2012-07-27 DIAGNOSIS — L03119 Cellulitis of unspecified part of limb: Secondary | ICD-10-CM | POA: Insufficient documentation

## 2012-07-27 DIAGNOSIS — R42 Dizziness and giddiness: Secondary | ICD-10-CM | POA: Insufficient documentation

## 2012-07-27 MED ORDER — CLINDAMYCIN HCL 300 MG PO CAPS
300.0000 mg | ORAL_CAPSULE | Freq: Once | ORAL | Status: AC
  Administered 2012-07-27: 300 mg via ORAL
  Filled 2012-07-27: qty 1

## 2012-07-27 MED ORDER — CLINDAMYCIN HCL 300 MG PO CAPS: 300.0000 mg | ORAL_CAPSULE | Freq: Four times a day (QID) | ORAL | Status: DC

## 2012-07-27 MED ORDER — HYDROCODONE-ACETAMINOPHEN 5-325 MG PO TABS: 1.0000 | ORAL_TABLET | ORAL | Status: DC | PRN

## 2012-07-27 MED ORDER — HYDROCODONE-ACETAMINOPHEN 5-325 MG PO TABS
1.0000 | ORAL_TABLET | Freq: Once | ORAL | Status: AC
  Administered 2012-07-27: 1 via ORAL
  Filled 2012-07-27: qty 1

## 2012-07-27 NOTE — ED Notes (Signed)
Pt states she is having a tattoo removed with a laser and states it is red and painful  Pt states today she has been feeling dizzy, having nausea, vomiting, and diarrhea today  Pt states the area is infected causing her to be sick

## 2012-07-27 NOTE — ED Provider Notes (Signed)
History     CSN: [id number]  Arrival date & time 07/27/12  4098   First MD Initiated Contact with Patient 07/27/12 2015      Chief Complaint  Patient presents with  . Cellulitis   HPI  History provided by the patient. Patient is a 42 year old female with no significant PMH who presents with complaints of right lower leg swelling, redness and pain. Patient states that she has a tattoo on her ankle that she has been trying to remove with laser treatments. She last had a laser treatment months ago. Recently she was using [facility] appointments and tape over the area which she thinks caused irritation with increased itching. Patient does admit to significant scratching over the area. Over the past 2 weeks she began having some increased pain and irritation with increasing redness of the skin. Now over the past few days she has diffuse redness and warmth of her right lower leg. She also reports a deep throbbing pain to the leg. Patient has also had some increased fatigue, lightheadedness and generally not feeling well. She did have some episodes of nausea vomiting and diarrhea today. She denies any known sick contacts. Denies any recent travel. Denies any chest pain or shortness of breath. She has not used any treatments for her symptoms. Denies any other aggravating or alleviating factors. Denies any other associated symptoms.    Past Medical History  Diagnosis Date  . Positive PPD     s/p abx per patient    Past Surgical History  Procedure Date  . Cholecystectomy 12/01  . Right fallopian tube tied     RIGHT  . Laparoscopic gastric banding 2008    see OV 06/09/08- ?Malfunction  . Ectopic pregnancy surgery     LEFT  . Left fllopian tube removed     Family History  Problem Relation Age of Onset  . Diabetes      grandmother  . Hypertension Father   . Coronary artery disease Neg Hx   . Stroke Neg Hx   . Colon cancer Neg Hx   . Breast cancer Neg Hx     History  Substance Use Topics   . Smoking status: Never Smoker   . Smokeless tobacco: Not on file  . Alcohol Use: No    OB History    Grav Para Term Preterm Abortions TAB SAB Ect Mult Living                  Review of Systems  Constitutional: Positive for chills. Negative for fever.  Respiratory: Negative for shortness of breath.   Cardiovascular: Negative for chest pain.  Gastrointestinal: Positive for nausea, vomiting and diarrhea. Negative for abdominal pain.  Neurological: Positive for light-headedness. Negative for headaches.  All other systems reviewed and are negative.    Allergies  Adhesive  Home Medications   Current Outpatient Rx  Name  Route  Sig  Dispense  Refill  . ACETAMINOPHEN 325 MG PO TABS   Oral   Take 650 mg by mouth every 6 (six) hours as needed. Pain         . HYOSCYAMINE SULFATE ER 0.375 MG PO CP12   Oral   Take 1 capsule (0.375 mg total) by mouth 2 (two) times daily as needed for cramping.   20 capsule   0     BP 97/67  Pulse 101  Temp 98.2 F (36.8 C) (Oral)  Resp 20  SpO2 100%  LMP 07/11/2012  Physical Exam  Nursing  note and vitals reviewed. Constitutional: She is oriented to person, place, and time. She appears well-developed and well-nourished. No distress.  HENT:  Head: Normocephalic.  Cardiovascular: Normal rate and regular rhythm.   No murmur heard. Pulmonary/Chest: Effort normal and breath sounds normal. No respiratory distress. She has no wheezes. She has no rales.  Musculoskeletal: Normal range of motion. She exhibits edema and tenderness.       See skin exam.  Neurological: She is alert and oriented to person, place, and time.  Skin: Skin is warm and dry.       Tattoo to the right lateral ankle area with 2 areas of thickened and keratinized skin with erythema. There is a diffuse erythema to the lateral and anterior lower leg with slight streaking. Increased warmth of the skin over this area. Patient also with mild tenderness. Normal pulses and  sensations and foot.  Psychiatric: She has a normal mood and affect. Her behavior is normal.    ED Course  Procedures       1. Cellulitis of right lower leg       MDM  9:45 PM patient seen and evaluated. Patient currently appears well in no acute distress. Patient afebrile.        Angus Seller, [place] 07/28/12 [id number]

## 2012-07-28 NOTE — ED Provider Notes (Signed)
Medical screening examination/treatment/procedure(s) were performed by non-physician practitioner and as supervising physician I was immediately available for consultation/collaboration.  Rhona Fusilier T Kessie Croston, MD 07/28/12 2320 

## 2012-09-06 ENCOUNTER — Other Ambulatory Visit: Payer: Self-pay

## 2012-09-13 ENCOUNTER — Encounter: Payer: Self-pay | Admitting: Neurology

## 2012-09-15 ENCOUNTER — Other Ambulatory Visit: Payer: Self-pay | Admitting: Neurology

## 2012-09-15 DIAGNOSIS — G932 Benign intracranial hypertension: Secondary | ICD-10-CM

## 2012-09-15 DIAGNOSIS — H538 Other visual disturbances: Secondary | ICD-10-CM

## 2012-09-16 ENCOUNTER — Ambulatory Visit
Admission: RE | Admit: 2012-09-16 | Discharge: 2012-09-16 | Disposition: A | Discharge status: Home / Self Care | Payer: No Typology Code available for payment source | Source: Ambulatory Visit | Attending: Neurology | Admitting: Neurology

## 2012-09-16 VITALS — BP 118/80 | HR 64

## 2012-09-16 DIAGNOSIS — G932 Benign intracranial hypertension: Secondary | ICD-10-CM

## 2012-09-16 DIAGNOSIS — H538 Other visual disturbances: Secondary | ICD-10-CM

## 2012-09-16 LAB — CSF CELL COUNT WITH DIFFERENTIAL
RBC Count, CSF: 1 cu mm — ABNORMAL HIGH
Tube #: 4
WBC, CSF: 1 cu mm (ref 0–5)

## 2012-09-16 LAB — GRAM STAIN

## 2012-09-16 LAB — GLUCOSE, CSF: Glucose, CSF: 58 mg/dL (ref 43–76)

## 2012-09-17 ENCOUNTER — Encounter: Payer: Self-pay | Admitting: Gynecology

## 2012-09-17 LAB — VDRL, CSF

## 2012-09-18 ENCOUNTER — Ambulatory Visit (INDEPENDENT_AMBULATORY_CARE_PROVIDER_SITE_OTHER): Payer: Self-pay | Admitting: Gynecology

## 2012-09-18 ENCOUNTER — Encounter: Payer: Self-pay | Admitting: Gynecology

## 2012-09-18 VITALS — BP 124/84 | Ht 63.0 in | Wt 248.0 lb

## 2012-09-18 DIAGNOSIS — N926 Irregular menstruation, unspecified: Secondary | ICD-10-CM

## 2012-09-18 MED ORDER — MEGESTROL ACETATE 20 MG PO TABS: 20.0000 mg | ORAL_TABLET | Freq: Every day | ORAL | Status: DC

## 2012-09-18 NOTE — Patient Instructions (Signed)
Follow up for ultrasound as scheduled 

## 2012-09-18 NOTE — Progress Notes (Signed)
Patient presents having not been seen in several years. She has a complex history of regular menses through October. She skipped November that had regular menses in December skipped January and just started bleeding February 14 is put on and off since then. She is status post essential BTL with left salpingectomy for ectopic pregnancy and right tubal cautery for hydrosalpinx. Has been having headaches with most recent evaluation by neurology and followup spinal tap recently. Reportedly with pseudotumor cerebri. Had been having regular menses up until November skipped periods she is a history of irregular menses in the past consistent with PCO and has undergone hormonal studies as well as sonohysterogram of which were negative. She is overdue for her annual exam as she knows this and will schedule this as a separate appointment.  Exam with [name] assistant Abdomen obese soft nontender without masses guarding rebound organomegaly. Pelvic external BUS vagina with menses type flow. Cervix normal. Uterus difficult to palpate due to abdominal girth without gross masses or tenderness. Adnexa without gross masses or tenderness.  Assessment and plan: Irregular menses following regular menses. Headaches, blurred vision diagnosis by her history of pseudotumor cerebri. We'll check hCG TSH FSH prolactin and plan Megace 20 mg twice a day times several days then daily x1 week withdrawal. Ultrasound in several weeks to assess pelvic anatomy. Plan for either expectant management or intermittent progesterone withdrawal. Do not feel at this point and mutual sampling needed as she is never gone more than 50 days without menses. Patient will follow up for her ultrasound and blood work results and ultimately for her annual exam the she will schedule. I did ask her to call her neurologist now to be seen as she is still complaining of a headache and her husband agrees to do so.

## 2012-09-19 LAB — TSH: TSH: 3.392 u[IU]/mL (ref 0.350–4.500)

## 2012-09-19 LAB — FOLLICLE STIMULATING HORMONE: FSH: 7.7 m[IU]/mL

## 2012-09-19 LAB — PROLACTIN: Prolactin: 9.3 ng/mL

## 2012-10-03 ENCOUNTER — Ambulatory Visit: Payer: BC Managed Care – PPO | Admitting: Gynecology

## 2012-10-03 ENCOUNTER — Encounter: Payer: Self-pay | Admitting: Gynecology

## 2012-10-03 ENCOUNTER — Ambulatory Visit: Payer: BC Managed Care – PPO

## 2012-10-03 DIAGNOSIS — N926 Irregular menstruation, unspecified: Secondary | ICD-10-CM

## 2012-10-03 DIAGNOSIS — N938 Other specified abnormal uterine and vaginal bleeding: Secondary | ICD-10-CM

## 2012-10-03 DIAGNOSIS — N949 Unspecified condition associated with female genital organs and menstrual cycle: Secondary | ICD-10-CM

## 2012-10-03 NOTE — Patient Instructions (Signed)
Follow up in May for annual exam.  Sooner if irregulare bleeding continues.

## 2012-10-03 NOTE — Progress Notes (Signed)
Patient presents for followup ultrasound. History of menstrual irregularity most recently currently on Megace with no bleeding.  Ultrasound shows uterus overall normal in size. An initial echo 11.5 mm. Right ovary normal. Left ovary with small thin-walled avascular cyst 18 mm. Cul-de-sac without free fluid.  Assessment and plan: History of regular menses until most recently with irregularity. Started on Megace now without bleeding.  Recent lab showed a normal FSH, prolactin, TSH and a negative hCG. Recommend she stop her Megace now have a withdrawal keep a menstrual calendar and followup with me in May for an annual exam and we'll see how she does in the interim. Patient agrees with the plan. She is actively seeing a neurologist for her other issues and recently saw an ophthalmologist. She is getting better from her visual headache standpoint.

## 2012-10-13 LAB — FUNGUS CULTURE W SMEAR: Smear Result: NONE SEEN

## 2012-11-06 ENCOUNTER — Ambulatory Visit (INDEPENDENT_AMBULATORY_CARE_PROVIDER_SITE_OTHER): Payer: BC Managed Care – PPO | Admitting: Neurology

## 2012-11-06 ENCOUNTER — Encounter: Payer: Self-pay | Admitting: Neurology

## 2012-11-06 VITALS — BP 130/80 | Ht 62.0 in | Wt 244.0 lb

## 2012-11-06 DIAGNOSIS — A64 Unspecified sexually transmitted disease: Secondary | ICD-10-CM

## 2012-11-06 DIAGNOSIS — G932 Benign intracranial hypertension: Secondary | ICD-10-CM | POA: Insufficient documentation

## 2012-11-06 DIAGNOSIS — R7611 Nonspecific reaction to tuberculin skin test without active tuberculosis: Secondary | ICD-10-CM

## 2012-11-06 DIAGNOSIS — E079 Disorder of thyroid, unspecified: Secondary | ICD-10-CM

## 2012-11-06 DIAGNOSIS — H538 Other visual disturbances: Secondary | ICD-10-CM

## 2012-11-06 NOTE — Progress Notes (Signed)
HPI: [name] is a 42 years old right-handed Caucasian female, accompanied by her husband, referred by ophthalmologist Dr. Luciana Axe for evaluation of bilateral papillary edema  She has past medical history of obesity, 20 pound weight gain over past 6 months, did have a history of lap band in 2006, initially had losing weight, has steadily gain over the past few years  Since January 2014, she began to have holo-cranial pressure headaches, sometimes can up to 10 out of 10, with associated light movement sensitivity, she also noticed dark flashing spots in her bilateral peripheral visual field, when she closed her eyes, she see dark black electricity lines across her visual field, she described difficulty concentrating, she also complains blurry vision, difficulty refocusing when she bending down  Laboratory evaluation showed normal CMP, CBC, TSH was slightly elevated 4.52, normal free T4 and T3, vitamin D was low 15,  She had lumbar puncture in February 26 20 14, open pressure was 35.5, WBC 0, total protein 29 RBC 0, glucose 58, VDRL nonreactive, MRI of the brain was normal done at [facility],  She has mild positional headache after LP, has improved, she initially has paresthesia while taking topiramate, now has improved as well, she has started exercise, lost 5 pounds over past 1 month, complains of exertional and stress induced occipital area pressure headache, bilateral ear whooshing sounds,  She saw Dr. Luciana Axe in March 2014, "every thing is normal, no swelling" but I do not have the report.  UPDATE April 17th 2014:  She has episodes of neck and arm, neck went to stiff, her jaw went into stiffness, face turned to the right side, lasting for a few seconds, no loss of consciousness, followed by whole-body numbness tingling, she does not know what to do, was scared, tearful,  In addition, she complains of seeing movement, motion around her, numbness tingling, contributed to the side effect of Topamax, she  wants to get off the Topamax, she no longer has headaches,  Review of Systems  Out of a complete 14 system review, the patient complains of only the following symptoms, and all other reviewed systems are negative.  Constitutional: Weight loss , mild difficulty swallowing, numbness,     Physical Exam  Neck: supple no carotid bruits Respiratory: clear to auscultation bilaterally Cardiovascular: regular rate rhythm  Neurologic Exam  Mental Status: morbidly obese, awake, alert, cooperative to history, talking, and casual conversation. Cranial Nerves: CN II-XII pupils were equal round reactive to light.  I was able to appreciate bilateral temporal edge on fundoscopy exam.  Extraocular movements were full.  Visual fields were full on confrontational test.  Facial sensation and strength were normal.  Hearing was intact to finger rubbing bilaterally.  Uvula tongue were midline.  Head turning and shoulder shrugging were normal and symmetric.  Tongue protrusion into the cheeks strength were normal.  Motor: Normal tone, bulk, and strength. Sensory: Normal to light touch, pinprick, proprioception, and vibratory sensation. Coordination: Normal finger-to-nose, heel-to-shin.  There was no dysmetria noticed. Gait and Station: Narrow based and steady, was able to perform tiptoe, heel, and tandem walking without difficulty.  Romberg sign: Negative Reflexes: Deep tendon reflexes: Biceps: 2/2, Brachioradialis: 2/2, Triceps: 2/2, Pateller: 2/2, Achilles: 2/2.  Plantar responses are flexor.   Assessment and Plan: 42 years old female, with obesity, presenting with two-month history of intermittent headaches, blurry vision, flashing lights in her visual field, was found to have bilateral papillary edema, LP OP 35.5, her symptoms improved after LP.  1. Consistent with  pseudotumor cerebri 2.do not see papillary edema on today's examination, tapering off topiramate,  3.to clinic in 6 months, call our clinic for  recurrent issues,

## 2012-11-06 NOTE — Patient Instructions (Addendum)
Topamax 50mg  ii bid, 50mg  one twice a day x one week, then stop

## 2012-11-10 ENCOUNTER — Encounter: Payer: Self-pay | Admitting: Internal Medicine

## 2012-11-10 ENCOUNTER — Ambulatory Visit (INDEPENDENT_AMBULATORY_CARE_PROVIDER_SITE_OTHER): Payer: BC Managed Care – PPO | Admitting: Internal Medicine

## 2012-11-10 VITALS — BP 112/78 | HR 75 | Temp 97.8°F | Wt 245.0 lb

## 2012-11-10 DIAGNOSIS — G932 Benign intracranial hypertension: Secondary | ICD-10-CM

## 2012-11-10 DIAGNOSIS — J209 Acute bronchitis, unspecified: Secondary | ICD-10-CM

## 2012-11-10 DIAGNOSIS — L989 Disorder of the skin and subcutaneous tissue, unspecified: Secondary | ICD-10-CM

## 2012-11-10 NOTE — Progress Notes (Signed)
  Subjective:    Patient ID: Samantha Smith, female    DOB: Dec 20, 1970, 42 y.o.   MRN: [id number]  HPI Here to discuss the following issues:  Approximately 08-2012, She had visual disturbances, went to see Dr. Luciana Axe ophthalmology, Was diagnosed with bilateral papillary edema, was referred to neurology Dr. Debarah Crape , she underwent a workup and was diagnosed with pseudotumor cerebra. Since then  she has lost 20 pounds per our scales (35 pounds per her scales) an is taking Topamax. She has a number of questions. See assessment and plan.  Has a tattoo at the right leg for many years, on 11/2011 an attempt was made to remove the tattoo, since then has on and off inflammation and swelling, has seen at least two dermatologist, was prescribed clobetasol and clindamycin by Dr. Margo Aye  few weeks ago but it only helped temporarily.  Also complains of "bronchitis" x 10 days: chest congestion, cough, sputum production, fever With the onset of symptoms. Overall is much better and is only left with mild cough and chest congestion.  PMH-- reviewed  Past Surgical History  Procedure Laterality Date  . Cholecystectomy  12/01  . Right fallopian tube tied  2005    RIGHT  . Laparoscopic gastric banding  2008    see OV 06/09/08- ?Malfunction  . Ectopic pregnancy surgery      LEFT  . Left fallopian tube removed  2011       Review of Systems ++ Stress due to the diagnosis of pseudotumor and ongoing inflammation of their right leg.    Objective:   Physical Exam  Constitutional: She appears well-developed.  Mild emotional distress  HENT:  Head: Normocephalic and atraumatic.  Right Ear: External ear normal.  Left Ear: External ear normal.  Pulmonary/Chest: Effort normal. No respiratory distress. She has no wheezes. She has no rales. She exhibits no tenderness.  Few rhonchi with cough, other wise clear to auscultation. No respiratory distress  Skin:             Assessment & Plan:  Bronchitis, already  getting better, recommend conservative treatment. See instructions.  Today , I spent more than 25  min with the patient, >50% of the time counseling

## 2012-11-10 NOTE — Assessment & Plan Note (Signed)
Chronic inflammation at a site of an attempted tattoo removal from the right leg in May 2013. Has seen 2 dermatologists already. Plan: refer to plastic surgery

## 2012-11-10 NOTE — Patient Instructions (Addendum)
Rest, fluids , tylenol For cough, take Mucinex DM twice a day as needed  Call if no better in few days Call anytime if the symptoms are severe

## 2012-11-10 NOTE — Assessment & Plan Note (Addendum)
Diagnosed with pseudotumor cerebri 2 months ago, apparently the diagnosis was supported by a LP. The patient has a number of questions, she has already seen Dr. Debarah Crape and another neurologist in [facility]. Likes  to keep the neurologist in [facility] but would like another opinion. The patient is   emotional about this issue. I recommend her to continue her efforts to lose weight as that is probably the most important therapeutic step. I'll refer her to neurosurgery @ [facility]----> Prescott Parma, MD  Printed material about the dx provided

## 2012-12-19 ENCOUNTER — Telehealth: Payer: Self-pay | Admitting: *Deleted

## 2012-12-19 NOTE — Telephone Encounter (Signed)
Pt called to let Dr Audie Box know that she had not had a period in May. I advised that she is way overdue for her annual, she will schedule KW

## 2012-12-21 DIAGNOSIS — B977 Papillomavirus as the cause of diseases classified elsewhere: Secondary | ICD-10-CM

## 2012-12-21 HISTORY — DX: Papillomavirus as the cause of diseases classified elsewhere: B97.7

## 2012-12-31 ENCOUNTER — Ambulatory Visit (INDEPENDENT_AMBULATORY_CARE_PROVIDER_SITE_OTHER): Payer: BC Managed Care – PPO | Admitting: Internal Medicine

## 2012-12-31 ENCOUNTER — Encounter: Payer: Self-pay | Admitting: Internal Medicine

## 2012-12-31 VITALS — BP 112/76 | HR 65 | Temp 98.0°F | Wt 238.0 lb

## 2012-12-31 DIAGNOSIS — R197 Diarrhea, unspecified: Secondary | ICD-10-CM

## 2012-12-31 DIAGNOSIS — G932 Benign intracranial hypertension: Secondary | ICD-10-CM

## 2012-12-31 NOTE — Progress Notes (Signed)
  Subjective:    Patient ID: Samantha Smith, female    DOB: 04-24-1971, 42 y.o.   MRN: [id number]  HPI Acute visit 2 days history of diarrhea on and off, it is watery, nonbloody, approximately 6 episodes during the daytime. Had some fever on and off. Wonders if it is related to the Chronically infected tattoo at the right leg (I don't believe it is)    Past Medical History  Diagnosis Date  . Positive PPD     s/p abx per patient  . Thyroid disease   . STD (sexually transmitted disease)     Chlamydia history  . LGSIL (low grade squamous intraepithelial dysplasia) 2007  . Blurred vision   . Morbid obesity    Past Surgical History  Procedure Laterality Date  . Cholecystectomy  12/01  . Right fallopian tube tied  2005    RIGHT  . Laparoscopic gastric banding  2008    see OV 06/09/08- ?Malfunction  . Ectopic pregnancy surgery      LEFT  . Left fallopian tube removed  2011    Review of Systems No nausea or vomiting, some decreased appetite. No dysuria or gross hematuria. No recent antibiotics. Also reports her last menstrual period was 2 months ago . Has seen some bumps in the arm recently, concerned about it.    Objective:   Physical Exam  General -- alert, well-developed, NAD, VSS HEENT -- not pale  Abdomen--soft,  no distention, no masses, Mild tenderness without rebound at the left side of the abdomen.   Extremities--  3-4 skin colored, 1 mm papular skin lesions at the L forearm Neurologic-- alert & oriented X3 and strength normal in all extremities. Psych-- Cognition and judgment appear intact. Alert and cooperative with normal attention span and concentration.  not anxious appearing and not depressed appearing.       Assessment & Plan:   Diarrhea,  Acute diarrhea for 2 days, no red flag  symptoms. Slightly tender in the left abdomen but no mass or rebound (mild colitis?) Recommend conservative treatment, see  Instructions.  I also recommended, UPT because her  last period was 2 months ago, she declined, reports she had tubes either tied or removed.

## 2012-12-31 NOTE — Patient Instructions (Addendum)
Drink plenty of fluids ,follow a bland diet such as soup and rice. Pepto-Bismol OTC as needed Call if you're not better in 3-4 days. Call anytime if you have severe symptoms, blood in the stools, high fever, increased stomach pain.  Diarrhea Diarrhea is frequent loose and watery bowel movements. It can cause you to feel weak and dehydrated. Dehydration can cause you to become tired and thirsty, have a dry mouth, and have decreased urination that often is dark yellow. Diarrhea is a sign of another problem, most often an infection that will not last long. In most cases, diarrhea typically lasts 2 3 days. However, it can last longer if it is a sign of something more serious. It is important to treat your diarrhea as directed by your caregive to lessen or prevent future episodes of diarrhea. CAUSES  Some common causes include:  Gastrointestinal infections caused by viruses, bacteria, or parasites.  Food poisoning or food allergies.  Certain medicines, such as antibiotics, chemotherapy, and laxatives.  Artificial sweeteners and fructose.  Digestive disorders. HOME CARE INSTRUCTIONS  Ensure adequate fluid intake (hydration): have 1 cup (8 oz) of fluid for each diarrhea episode. Avoid fluids that contain simple sugars or sports drinks, fruit juices, whole milk products, and sodas. Your urine should be clear or pale yellow if you are drinking enough fluids. Hydrate with an oral rehydration solution that you can purchase at pharmacies, retail stores, and online. You can prepare an oral rehydration solution at home by mixing the following ingredients together:    tsp table salt.   tsp baking soda.   tsp salt substitute containing potassium chloride.  1  tablespoons sugar.  1 L (34 oz) of water.  Certain foods and beverages may increase the speed at which food moves through the gastrointestinal (GI) tract. These foods and beverages should be avoided and include:  Caffeinated and alcoholic  beverages.  High-fiber foods, such as raw fruits and vegetables, nuts, seeds, and whole grain breads and cereals.  Foods and beverages sweetened with sugar alcohols, such as xylitol, sorbitol, and mannitol.  Some foods may be well tolerated and may help thicken stool including:  Starchy foods, such as rice, toast, pasta, low-sugar cereal, oatmeal, grits, baked potatoes, crackers, and bagels.  Bananas.  Applesauce.  Add probiotic-rich foods to help increase healthy bacteria in the GI tract, such as yogurt and fermented milk products.  Wash your hands well after each diarrhea episode.  Only take over-the-counter or prescription medicines as directed by your caregiver.  Take a warm bath to relieve any burning or pain from frequent diarrhea episodes. SEEK IMMEDIATE MEDICAL CARE IF:   You are unable to keep fluids down.  You have persistent vomiting.  You have blood in your stool, or your stools are black and tarry.  You do not urinate in 6 8 hours, or there is only a small amount of very dark urine.  You have abdominal pain that increases or localizes.  You have weakness, dizziness, confusion, or lightheadedness.  You have a severe headache.  Your diarrhea gets worse or does not get better.  You have a fever or persistent symptoms for more than 2 3 days.  You have a fever and your symptoms suddenly get worse. MAKE SURE YOU:   Understand these instructions.  Will watch your condition.  Will get help right away if you are not doing well or get worse. Document Released: 06/29/2002 Document Revised: 06/25/2012 Document Reviewed: 03/16/2012 [facility] Patient Information 2014 [place], [place].

## 2012-12-31 NOTE — Assessment & Plan Note (Signed)
Saw a specialist at [facility], he had no further suggestions. The patient reports she was told that would be okay to stop Topamax if she liked to. She did gradually and feels about the same. Plan: Observation for now.

## 2013-01-15 ENCOUNTER — Ambulatory Visit (INDEPENDENT_AMBULATORY_CARE_PROVIDER_SITE_OTHER): Payer: BC Managed Care – PPO | Admitting: Gynecology

## 2013-01-15 ENCOUNTER — Other Ambulatory Visit (HOSPITAL_COMMUNITY)
Admission: RE | Admit: 2013-01-15 | Discharge: 2013-01-15 | Disposition: A | Discharge status: Home / Self Care | Payer: BC Managed Care – PPO | Source: Ambulatory Visit | Attending: Gynecology | Admitting: Gynecology

## 2013-01-15 ENCOUNTER — Encounter: Payer: Self-pay | Admitting: Gynecology

## 2013-01-15 VITALS — BP 112/74 | Ht 62.0 in | Wt 210.0 lb

## 2013-01-15 DIAGNOSIS — Z01419 Encounter for gynecological examination (general) (routine) without abnormal findings: Secondary | ICD-10-CM

## 2013-01-15 DIAGNOSIS — Z131 Encounter for screening for diabetes mellitus: Secondary | ICD-10-CM

## 2013-01-15 DIAGNOSIS — Z13220 Encounter for screening for lipoid disorders: Secondary | ICD-10-CM

## 2013-01-15 DIAGNOSIS — R87810 Cervical high risk human papillomavirus (HPV) DNA test positive: Secondary | ICD-10-CM | POA: Insufficient documentation

## 2013-01-15 DIAGNOSIS — Z1151 Encounter for screening for human papillomavirus (HPV): Secondary | ICD-10-CM | POA: Insufficient documentation

## 2013-01-15 LAB — HEMOGLOBIN A1C
Hgb A1c MFr Bld: 5 % (ref <5.7)
Mean Plasma Glucose: 97 mg/dL (ref <117)

## 2013-01-15 MED ORDER — MEDROXYPROGESTERONE ACETATE 10 MG PO TABS: 10.0000 mg | ORAL_TABLET | Freq: Every day | ORAL | Status: DC

## 2013-01-15 NOTE — Progress Notes (Addendum)
Samantha Smith 1971-02-19 [id number]        42 y.o.  G3P0030 for annual exam.  She was evaluated in March due to menstrual irregularity.  Notes that she had a regular period in April, skipped May and then a light period in June.  Past medical history,surgical history, medications, allergies, family history and social history were all reviewed and documented in the EPIC chart.  ROS:  Performed and pertinent positives and negatives are included in the history, assessment and plan .  Exam: [name] assistant Filed Vitals:   01/15/13 1515  BP: 112/74  Height: 5\' 2"  (1.575 m)  Weight: 210 lb (95.255 kg)   General appearance  Normal Skin grossly normal Head/Neck normal with no cervical or supraclavicular adenopathy thyroid normal Lungs  clear Cardiac RR, without RMG Abdominal  soft, nontender, without masses, organomegaly or hernia Breasts  examined lying and sitting without masses, retractions, discharge or axillary adenopathy. Pelvic  Ext/BUS/vagina  normal   Cervix  normal Pap/HPV  Uterus  anteverted grossly normal size,  nontender. Exam limited by abdominal girth  Adnexa  Without gross masses or tenderness    Anus and perineum  normal   Rectovaginal  normal sphincter tone without palpated masses or tenderness.    Assessment/Plan:  42 y.o. G66P0030 female for annual exam.   1. History of PCOS. Essential BTL with left salpingectomy due to ectopic pregnancy and right tubal cauterization for hydrosalpinx. Recent FSH TSH prolactin normal. History of pseudotumor cerebri which is resolving. Recommended Provera 10 mg daily x10 days now to bring on a regular menses as this month's menses was light and then monitor menstrual cycle. She will use Provera 10 mg x10 days every other month if she is without menses. She has a normal regular flow then she'll monitor that month. Issues of hyperplasia with prolonged amenorrhea discussed. 2. Pap smear 2010. Pap/HPV done today. History of LGSIL 2007.  Colposcopic biopsy consistent with LGSIL. Followup Pap smears 2008, 2009 and 2010 normal. 3. Mammography 2009. Patient knows she's overdue and agrees to schedule. SBE monthly reviewed. 4. Health maintenance. Comprehensive metabolic panel lipid profile TSH hemoglobin A1c urinalysis ordered. CBC recently at other physician's office was normal with hemoglobin 13 hematocrit 39 platelets 259,000. Follow menstrual cycle again have a Provera withdrawal if she goes more than 6-8 weeks without menses. Followup if any significant irregular bleeding.   Dara Lords MD, 4:35 PM 01/15/2013

## 2013-01-15 NOTE — Patient Instructions (Addendum)
Take the Provera for 10 days if you go more than 6-8 weeks without a period. Followup if you have significant prolonged or atypical bleeding.  Call to Schedule your mammogram  Facilities in [place]: 1)  [facility], [address] Phone: [phone] 2)  [facility], [phone] Phone: [phone] 3)  Dr. Yolanda Bonine at [facility] [address] Phone: [phone]     Mammogram A mammogram is an X-ray test to find changes in a woman's breast. You should get a mammogram if:  You are 33 years of age or older  You have risk factors.   Your doctor recommends that you have one.  BEFORE THE TEST  Do not schedule the test the week before your period, especially if your breasts are sore during this time.  On the day of your mammogram:  Wash your breasts and armpits well. After washing, do not put on any deodorant or talcum powder on until after your test.   Eat and drink as you usually do.   Take your medicines as usual.   If you are diabetic and take insulin, make sure you:   Eat before coming for your test.   Take your insulin as usual.   If you cannot keep your appointment, call before the appointment to cancel. Schedule another appointment.  TEST  You will need to undress from the waist up. You will put on a hospital gown.   Your breast will be put on the mammogram machine, and it will press firmly on your breast with a piece of plastic called a compression paddle. This will make your breast flatter so that the machine can X-ray all parts of your breast.   Both breasts will be X-rayed. Each breast will be X-rayed from above and from the side. An X-ray might need to be taken again if the picture is not good enough.   The mammogram will last about 15 to 30 minutes.  AFTER THE TEST Finding out the results of your test Ask when your test results will be ready. Make  sure you get your test results.  Document Released: 10/05/2008 Document Revised: 06/28/2011 Document Reviewed: 10/05/2008 [facility] Patient Information 2012 [place], [place].

## 2013-01-16 LAB — COMPREHENSIVE METABOLIC PANEL
AST: 19 U/L (ref 0–37)
Albumin: 3.9 g/dL (ref 3.5–5.2)
Alkaline Phosphatase: 89 U/L (ref 39–117)
BUN: 9 mg/dL (ref 6–23)
Potassium: 4.3 mEq/L (ref 3.5–5.3)
Total Bilirubin: 0.4 mg/dL (ref 0.3–1.2)

## 2013-01-16 LAB — URINALYSIS W MICROSCOPIC + REFLEX CULTURE
Bacteria, UA: NONE SEEN
Bilirubin Urine: NEGATIVE
Hgb urine dipstick: NEGATIVE
Ketones, ur: NEGATIVE mg/dL
Nitrite: NEGATIVE
Protein, ur: NEGATIVE mg/dL
Specific Gravity, Urine: 1.015 (ref 1.005–1.030)
Urobilinogen, UA: 0.2 mg/dL (ref 0.0–1.0)

## 2013-01-16 LAB — LIPID PANEL
HDL: 50 mg/dL (ref >39)
LDL Cholesterol: 111 mg/dL — ABNORMAL HIGH (ref 0–99)
Total CHOL/HDL Ratio: 3.9 Ratio
Triglycerides: 166 mg/dL — ABNORMAL HIGH (ref <150)
VLDL: 33 mg/dL (ref 0–40)

## 2013-01-16 LAB — TSH: TSH: 4.583 u[IU]/mL — ABNORMAL HIGH (ref 0.350–4.500)

## 2013-01-20 ENCOUNTER — Encounter: Payer: Self-pay | Admitting: Gynecology

## 2013-01-20 ENCOUNTER — Other Ambulatory Visit: Payer: Self-pay | Admitting: Gynecology

## 2013-01-20 DIAGNOSIS — E039 Hypothyroidism, unspecified: Secondary | ICD-10-CM

## 2013-02-03 ENCOUNTER — Ambulatory Visit (INDEPENDENT_AMBULATORY_CARE_PROVIDER_SITE_OTHER): Payer: BC Managed Care – PPO | Admitting: Gynecology

## 2013-02-03 ENCOUNTER — Encounter: Payer: Self-pay | Admitting: Gynecology

## 2013-02-03 DIAGNOSIS — R87810 Cervical high risk human papillomavirus (HPV) DNA test positive: Secondary | ICD-10-CM

## 2013-02-03 DIAGNOSIS — N9089 Other specified noninflammatory disorders of vulva and perineum: Secondary | ICD-10-CM

## 2013-02-03 DIAGNOSIS — IMO0001 Reserved for inherently not codable concepts without codable children: Secondary | ICD-10-CM

## 2013-02-03 DIAGNOSIS — E039 Hypothyroidism, unspecified: Secondary | ICD-10-CM

## 2013-02-03 DIAGNOSIS — R6889 Other general symptoms and signs: Secondary | ICD-10-CM

## 2013-02-03 LAB — TSH: TSH: 4.923 u[IU]/mL — ABNORMAL HIGH (ref 0.350–4.500)

## 2013-02-03 LAB — T4: T4, Total: 9.2 ug/dL (ref 5.0–12.5)

## 2013-02-03 NOTE — Patient Instructions (Signed)
Office will call you with lab results and biopsy results.

## 2013-02-03 NOTE — Addendum Note (Signed)
Addended by: Dayna Barker on: 02/03/2013 12:41 PM   Modules accepted: Orders

## 2013-02-03 NOTE — Progress Notes (Signed)
Patient ID: Samantha Smith, female   DOB: Feb 25, 1971, 42 y.o.   MRN: [id number] Patient presents for colposcopy with history of LGSIL 2007. Colposcopic biopsy consistent with LGSIL. Followup Pap smears 2008, 2009 and 2010 normal. Most recent 12/2012 Pap smear showed ASCUS with positive high-risk HPV.   Patient also notes recurrent right labia minora swelling lasts for several days and then resolves.  Patient also has had a marginally elevated TSH and is to repeat her TSH/T4 today.  Exam with Selena Batten assistant External BUS vagina with small nodule mid right labia minora with mucosal defect. Questionable ulcer. HSV PCR taken. Cervix high in the vault friable to swabbing.   Colposcopy after acetic acid cleanse shows inflammatory changes from 12 through 9 to 6:00. Adequate to ectropion transformation zone visualized. 3 biopsies taken. ECC performed. Monsel solution applied afterwards for hemostasis.  Physical Exam  Genitourinary:     Assessment and plan: 1. Ascus Pap smear with positive high-risk HPV. Inflammatory appearing ectropion. Rep. biopsies x3 taken. ECC performed. Patient will follow up her biopsy results and we'll go from there. 2. Small nodule right labia minora questionable herpetic lesion. HSV screen taken. 3. TSH mildly elevated at 4.58. Repeat TSH today with T4.

## 2013-02-05 ENCOUNTER — Telehealth: Payer: Self-pay | Admitting: *Deleted

## 2013-02-05 NOTE — Telephone Encounter (Signed)
Unable to leave message on cell because voicemail not set up. I tried the work # as well but unable to contact pt via work as well. Will try back later today.

## 2013-02-05 NOTE — Telephone Encounter (Signed)
Message copied by Aura Camps on Thu Feb 05, 2013 12:09 PM ------      Message from: Dara Lords      Created: Thu Feb 05, 2013 11:32 AM       Patient needs appointment to see me ASAP like tomorrow 12:30 or 4:30 prefer her husband to be available. We're going to discuss the biopsy results from her colposcopy ------

## 2013-02-06 ENCOUNTER — Telehealth: Payer: Self-pay

## 2013-02-06 ENCOUNTER — Encounter: Payer: Self-pay | Admitting: Gynecology

## 2013-02-06 ENCOUNTER — Ambulatory Visit (INDEPENDENT_AMBULATORY_CARE_PROVIDER_SITE_OTHER): Payer: BC Managed Care – PPO | Admitting: Gynecology

## 2013-02-06 DIAGNOSIS — C539 Malignant neoplasm of cervix uteri, unspecified: Secondary | ICD-10-CM

## 2013-02-06 DIAGNOSIS — E039 Hypothyroidism, unspecified: Secondary | ICD-10-CM

## 2013-02-06 NOTE — Patient Instructions (Signed)
Followup with gynecologic oncologist Dr. Ginnie Smart, 02/12/2013 at 8:30 AM [facility]

## 2013-02-06 NOTE — Telephone Encounter (Signed)
Patient was informed that Dr. Velvet Bathe has test results that he needs to discuss with her.  Appt scheduled for 12:30 pm today.I did tell patient that Dr. Velvet Bathe suggested she bring her husband with her if possible.

## 2013-02-06 NOTE — Telephone Encounter (Signed)
Pt coming today at 12:30 regarding the below note. appt at [facility] on 02/12/13 @ 8:30 am with Dr.Bae-jump notes faxed pt will be informed with this information today at visit.

## 2013-02-06 NOTE — Progress Notes (Signed)
Patient presents to discuss her colposcopic biopsy results which unfortunately show:  Diagnosis 1. 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 - BENIGN ENDOCERVICAL MUCOSA. 2. 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 Colposcopic biopsy consistent with LGSIL. Followup Pap smears 2008, 2009 and 2010 were adequate normal. Had not been seen until most recently where Pap smear showed ASCUS with positive high-risk HPV. Colposcopy showed an inflammatory cervix with biopsies showing the above.  I reviewed the diagnoses with the patient and her husband. We reviewed in general potential treatment scenarios to include surgery, radiation and chemotherapy. She understands that I am not a gynecologic oncologist. I did discuss the poorly differentiated nature of the carcinoma and the need to proceed with evaluation and treatment aggressively. I have made an appointment for her to be seen at the [facility] July 24 8:30 AM by Dr. Corrin Maille Jump. Information was given to the patient and her husband. She understands that they will be contacting her for more information and directions as far as her appointment.  I emphasized that if they have any questions or any confusion throughout her treatment process to call me and I will help clarify.  Lastly I discussed with them that her repeat TSH did come back mildly elevated which suggests hypothyroid. At this point I do not think we should initiate thyroid replacement and complicate her current situation with dosage adjustments that we can wait until the above is resolved and address it at that time.

## 2013-02-06 NOTE — Telephone Encounter (Signed)
Left message on pt cell to call.

## 2013-02-20 DIAGNOSIS — C539 Malignant neoplasm of cervix uteri, unspecified: Secondary | ICD-10-CM

## 2013-02-20 HISTORY — PX: OTHER SURGICAL HISTORY: SHX169

## 2013-02-20 HISTORY — DX: Malignant neoplasm of cervix uteri, unspecified: C53.9

## 2013-02-26 DIAGNOSIS — C539 Malignant neoplasm of cervix uteri, unspecified: Secondary | ICD-10-CM | POA: Insufficient documentation

## 2013-03-16 ENCOUNTER — Encounter: Payer: Self-pay | Admitting: Gynecology

## 2013-04-27 ENCOUNTER — Ambulatory Visit (INDEPENDENT_AMBULATORY_CARE_PROVIDER_SITE_OTHER): Payer: BC Managed Care – PPO | Admitting: Gynecology

## 2013-04-27 ENCOUNTER — Encounter: Payer: Self-pay | Admitting: Gynecology

## 2013-04-27 DIAGNOSIS — N949 Unspecified condition associated with female genital organs and menstrual cycle: Secondary | ICD-10-CM

## 2013-04-27 DIAGNOSIS — R102 Pelvic and perineal pain: Secondary | ICD-10-CM

## 2013-04-27 NOTE — Patient Instructions (Signed)
Forward me a copy of your thyroid panel done through your other physicians office. I will obtain a copy of the operative report from [place] and followup with you.

## 2013-04-27 NOTE — Progress Notes (Addendum)
Patient presents complaining of pelvic pain on and off. Notes actually predates her radical hysterectomy but seems to also have occurred consistently since then.  Describes it as a pulling sensation. She had a radical robotic hysterectomy with pelvic and periaortic lymph node dissection. Apparently had at least her fallopian tube removed and repositioning of her ovaries in the event radiation was needed. I do not have a copy of the operative report but a discharge summary under media tab. No urinary symptoms or bowel symptoms. Overall is been doing well since surgery. Does feel tired after walking long distances.  Exam with [name] assistant Abdomen obese, soft without tenderness, rebound or gross masses. Laparoscopic incisions well-healed. Pelvic external BUS vagina with cuff intact. Suture line present with stiff clear sutures. Bimanual without gross masses. Diffuse tenderness to palpation.  Assessment and plan: Pelvic pain, long-standing which predated her surgery. Palpable sutures in the upper vagina. Unsure what they used to close her cuff. Will obtain detailed operative note from [facility]. Recommend continued abstinence at this point and to slowly resume normal activities with increasing walking. We'll hold on ultrasound until operative report reviewed and that if her pain continues will consider pelvic ultrasound for ovarian surveillance noting that they were just surveyed intraoperatively in August. Check UA today. Patient does have an appointment to followup with their clinic first week of December and will plan to do so.  She also has a mildly elevated TSH through my office in July. She reports a followup thyroid panel through her neurologist office in August and is going to get me a copy of this report so we can review and see if thyroid replacement necessary. She knows to call after 40 me the report to make sure that I have seen it and that we discuss it.

## 2013-04-28 LAB — URINALYSIS W MICROSCOPIC + REFLEX CULTURE
Bilirubin Urine: NEGATIVE
Crystals: NONE SEEN
Hgb urine dipstick: NEGATIVE
Ketones, ur: NEGATIVE mg/dL
Nitrite: NEGATIVE
Specific Gravity, Urine: 1.02 (ref 1.005–1.030)
Urobilinogen, UA: 0.2 mg/dL (ref 0.0–1.0)

## 2013-04-29 ENCOUNTER — Other Ambulatory Visit: Payer: Self-pay | Admitting: Gynecology

## 2013-04-29 MED ORDER — CIPROFLOXACIN HCL 250 MG PO TABS: 250.0000 mg | ORAL_TABLET | Freq: Two times a day (BID) | ORAL | Status: DC

## 2013-04-30 LAB — URINE CULTURE: Colony Count: >=100000

## 2013-05-04 ENCOUNTER — Encounter: Payer: Self-pay | Admitting: Gynecology

## 2013-05-08 ENCOUNTER — Ambulatory Visit: Payer: BC Managed Care – PPO | Admitting: Neurology

## 2013-05-18 ENCOUNTER — Emergency Department (HOSPITAL_COMMUNITY)
Admission: EM | Admit: 2013-05-18 | Discharge: 2013-05-18 | Disposition: A | Discharge status: Other Acute Inpatient Hospital | Payer: BC Managed Care – PPO | Attending: Emergency Medicine | Admitting: Emergency Medicine

## 2013-05-18 ENCOUNTER — Emergency Department (HOSPITAL_COMMUNITY): Payer: BC Managed Care – PPO

## 2013-05-18 ENCOUNTER — Encounter (HOSPITAL_COMMUNITY): Payer: Self-pay | Admitting: Emergency Medicine

## 2013-05-18 DIAGNOSIS — Z8619 Personal history of other infectious and parasitic diseases: Secondary | ICD-10-CM | POA: Insufficient documentation

## 2013-05-18 DIAGNOSIS — Z9089 Acquired absence of other organs: Secondary | ICD-10-CM | POA: Insufficient documentation

## 2013-05-18 DIAGNOSIS — Z862 Personal history of diseases of the blood and blood-forming organs and certain disorders involving the immune mechanism: Secondary | ICD-10-CM | POA: Insufficient documentation

## 2013-05-18 DIAGNOSIS — Z8639 Personal history of other endocrine, nutritional and metabolic disease: Secondary | ICD-10-CM | POA: Insufficient documentation

## 2013-05-18 DIAGNOSIS — N731 Chronic parametritis and pelvic cellulitis: Secondary | ICD-10-CM | POA: Insufficient documentation

## 2013-05-18 DIAGNOSIS — N739 Female pelvic inflammatory disease, unspecified: Secondary | ICD-10-CM

## 2013-05-18 DIAGNOSIS — R197 Diarrhea, unspecified: Secondary | ICD-10-CM | POA: Insufficient documentation

## 2013-05-18 DIAGNOSIS — Z3202 Encounter for pregnancy test, result negative: Secondary | ICD-10-CM | POA: Insufficient documentation

## 2013-05-18 DIAGNOSIS — Z8669 Personal history of other diseases of the nervous system and sense organs: Secondary | ICD-10-CM | POA: Insufficient documentation

## 2013-05-18 DIAGNOSIS — Z9884 Bariatric surgery status: Secondary | ICD-10-CM | POA: Insufficient documentation

## 2013-05-18 DIAGNOSIS — R509 Fever, unspecified: Secondary | ICD-10-CM | POA: Insufficient documentation

## 2013-05-18 DIAGNOSIS — R112 Nausea with vomiting, unspecified: Secondary | ICD-10-CM | POA: Insufficient documentation

## 2013-05-18 DIAGNOSIS — Z792 Long term (current) use of antibiotics: Secondary | ICD-10-CM | POA: Insufficient documentation

## 2013-05-18 DIAGNOSIS — Z8541 Personal history of malignant neoplasm of cervix uteri: Secondary | ICD-10-CM | POA: Insufficient documentation

## 2013-05-18 LAB — COMPREHENSIVE METABOLIC PANEL
ALT: 41 U/L — ABNORMAL HIGH (ref 0–35)
AST: 23 U/L (ref 0–37)
Albumin: 3 g/dL — ABNORMAL LOW (ref 3.5–5.2)
Alkaline Phosphatase: 186 U/L — ABNORMAL HIGH (ref 39–117)
Calcium: 9.4 mg/dL (ref 8.4–10.5)
Potassium: 3.5 mEq/L (ref 3.5–5.1)
Sodium: 132 mEq/L — ABNORMAL LOW (ref 135–145)
Total Protein: 7.6 g/dL (ref 6.0–8.3)

## 2013-05-18 LAB — URINALYSIS W MICROSCOPIC + REFLEX CULTURE
Bilirubin Urine: NEGATIVE
Glucose, UA: NEGATIVE mg/dL
Hgb urine dipstick: NEGATIVE
Specific Gravity, Urine: 1.005 (ref 1.005–1.030)
Urobilinogen, UA: 0.2 mg/dL (ref 0.0–1.0)
pH: 6.5 (ref 5.0–8.0)

## 2013-05-18 LAB — POCT PREGNANCY, URINE: Preg Test, Ur: NEGATIVE

## 2013-05-18 LAB — CBC WITH DIFFERENTIAL/PLATELET
Basophils Absolute: 0 10*3/uL (ref 0.0–0.1)
Eosinophils Absolute: 0.1 10*3/uL (ref 0.0–0.7)
Eosinophils Relative: 0 % (ref 0–5)
Lymphocytes Relative: 12 % (ref 12–46)
MCH: 31.6 pg (ref 26.0–34.0)
MCV: 94.1 fL (ref 78.0–100.0)
Neutrophils Relative %: 81 % — ABNORMAL HIGH (ref 43–77)
Platelets: 279 10*3/uL (ref 150–400)
RBC: 3.89 MIL/uL (ref 3.87–5.11)
RDW: 13.5 % (ref 11.5–15.5)
WBC: 20.4 10*3/uL — ABNORMAL HIGH (ref 4.0–10.5)

## 2013-05-18 MED ORDER — IOHEXOL 300 MG/ML  SOLN
50.0000 mL | Freq: Once | INTRAMUSCULAR | Status: AC | PRN
  Administered 2013-05-18: 50 mL via ORAL

## 2013-05-18 MED ORDER — IOHEXOL 300 MG/ML  SOLN
100.0000 mL | Freq: Once | INTRAMUSCULAR | Status: AC | PRN
  Administered 2013-05-18: 100 mL via INTRAVENOUS

## 2013-05-18 MED ORDER — PIPERACILLIN-TAZOBACTAM 3.375 G IVPB
3.3750 g | Freq: Once | INTRAVENOUS | Status: AC
  Administered 2013-05-18: 3.375 g via INTRAVENOUS
  Filled 2013-05-18 (×2): qty 50

## 2013-05-18 MED ORDER — DIPHENHYDRAMINE HCL 50 MG/ML IJ SOLN
25.0000 mg | Freq: Once | INTRAMUSCULAR | Status: AC
  Administered 2013-05-18: 25 mg via INTRAVENOUS
  Filled 2013-05-18: qty 1

## 2013-05-18 MED ORDER — SODIUM CHLORIDE 0.9 % IV BOLUS (SEPSIS)
1000.0000 mL | INTRAVENOUS | Status: AC
  Administered 2013-05-18: 1000 mL via INTRAVENOUS

## 2013-05-18 MED ORDER — KETOROLAC TROMETHAMINE 30 MG/ML IJ SOLN
30.0000 mg | Freq: Once | INTRAMUSCULAR | Status: AC
  Administered 2013-05-18: 30 mg via INTRAVENOUS
  Filled 2013-05-18: qty 1

## 2013-05-18 MED ORDER — ONDANSETRON HCL 4 MG/2ML IJ SOLN
4.0000 mg | Freq: Once | INTRAMUSCULAR | Status: AC
  Administered 2013-05-18: 4 mg via INTRAVENOUS
  Filled 2013-05-18: qty 2

## 2013-05-18 MED ORDER — METOCLOPRAMIDE HCL 5 MG/ML IJ SOLN
10.0000 mg | Freq: Once | INTRAMUSCULAR | Status: AC
  Administered 2013-05-18: 10 mg via INTRAVENOUS
  Filled 2013-05-18: qty 2

## 2013-05-18 NOTE — ED Provider Notes (Signed)
CSN: [id number]     Arrival date & time 05/18/13  1150 History   First MD Initiated Contact with Patient 05/18/13 1207     No chief complaint on file.  (Consider location/radiation/quality/duration/timing/severity/associated sxs/prior Treatment) Patient is a 42 y.o. female presenting with abdominal pain. The history is provided by the patient.  Abdominal Pain Pain location:  LLQ Pain quality: cramping   Pain radiates to:  Does not radiate Pain severity:  Mild Onset quality:  Gradual Duration:  6 days Timing:  Intermittent Progression:  Waxing and waning Chronicity:  New Context comment:  Worse with eating Relieved by:  Nothing Worsened by:  Eating Ineffective treatments: abx. Associated symptoms: diarrhea (mild loose stools, no blood seen), fever, nausea and vomiting (initially had vomiting, but not for last few days)   Associated symptoms: no chest pain, no cough, no dysuria, no fatigue, no hematuria and no shortness of breath     Past Medical History  Diagnosis Date  . Positive PPD     s/p abx per patient  . Thyroid disease   . STD (sexually transmitted disease)     Chlamydia history  . Blurred vision   . Morbid obesity   . ASCUS (atypical squamous cells of undetermined significance) on Pap smear 12/2012  . 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 Cervical cancer 02/2013    Stage IB1 poorly differentiated squamous cell   Past Surgical History  Procedure Laterality Date  . Cholecystectomy  12/01  . Right fallopian tube tied  2005    RIGHT  . Laparoscopic gastric banding  2008    see OV 06/09/08- ?Malfunction  . Ectopic pregnancy surgery      LEFT  . Left fallopian tube removed  2011  . Brain tumor excision    . Laparoscopic radical total hysterectomy w/ node biopsy, right salpingectomy  02/2013    [facility]   Family History  Problem Relation Age of Onset  . Hypertension Father   . Coronary artery disease Neg Hx   . Stroke Neg Hx   . Colon cancer Neg Hx   . Breast cancer  Neg Hx   . Heart disease Sister   . Diabetes Maternal Grandmother    History  Substance Use Topics  . Smoking status: Never Smoker   . Smokeless tobacco: Not on file  . Alcohol Use: No   OB History   Grav Para Term Preterm Abortions TAB SAB Ect Mult Living   3    3   1   0     Review of Systems  Constitutional: Positive for fever. Negative for fatigue.  HENT: Negative for congestion and drooling.   Eyes: Negative for pain.  Respiratory: Negative for cough and shortness of breath.   Cardiovascular: Negative for chest pain.  Gastrointestinal: Positive for nausea, vomiting (initially had vomiting, but not for last few days), abdominal pain and diarrhea (mild loose stools, no blood seen).  Genitourinary: Negative for dysuria and hematuria.  Musculoskeletal: Negative for back pain, gait problem and neck pain.  Skin: Negative for color change.  Neurological: Negative for dizziness and headaches.  Hematological: Negative for adenopathy.  Psychiatric/Behavioral: Negative for behavioral problems.  All other systems reviewed and are negative.    Allergies  Tetracyclines & related and Adhesive  Home Medications   Current Outpatient Rx  Name  Route  Sig  Dispense  Refill  . ciprofloxacin (CIPRO) 250 MG tablet   Oral   Take 1 tablet (250 mg total) by mouth 2 (  two) times daily.   14 tablet   0   . Multiple Vitamin (MULTIVITAMIN) tablet   Oral   Take 1 tablet by mouth daily.          BP 106/64  Pulse 99  Temp(Src) 98.5 F (36.9 C) (Oral)  Resp 18  SpO2 100%  LMP 01/02/2013 Physical Exam  Nursing note and vitals reviewed. Constitutional: She is oriented to person, place, and time. She appears well-developed and well-nourished.  HENT:  Head: Normocephalic.  Mouth/Throat: No oropharyngeal exudate.  Eyes: Conjunctivae and EOM are normal. Pupils are equal, round, and reactive to light.  Neck: Normal range of motion. Neck supple.  Cardiovascular: Normal rate, regular  rhythm, normal heart sounds and intact distal pulses.  Exam reveals no gallop and no friction rub.   No murmur heard. Pulmonary/Chest: Effort normal and breath sounds normal. No respiratory distress. She has no wheezes.  Abdominal: Soft. Bowel sounds are normal. There is tenderness (mild ttp of LLQ). There is no rebound and no guarding.  Musculoskeletal: Normal range of motion. She exhibits no edema and no tenderness.  Neurological: She is alert and oriented to person, place, and time.  Skin: Skin is warm and dry.  Psychiatric: She has a normal mood and affect. Her behavior is normal.    ED Course  Procedures (including critical care time) Labs Review Labs Reviewed  CBC WITH DIFFERENTIAL - Abnormal; Notable for the following:    WBC 20.4 (*)    Neutrophils Relative % 81 (*)    Neutro Abs 16.5 (*)    Monocytes Absolute 1.3 (*)    All other components within normal limits  COMPREHENSIVE METABOLIC PANEL - Abnormal; Notable for the following:    Sodium 132 (*)    BUN 5 (*)    Albumin 3.0 (*)    ALT 41 (*)    Alkaline Phosphatase 186 (*)    All other components within normal limits  LIPASE, BLOOD  URINALYSIS W MICROSCOPIC + REFLEX CULTURE  POCT PREGNANCY, URINE   Imaging Review Ct Abdomen Pelvis W Contrast  05/18/2013   CLINICAL DATA:  Left lower quadrant abdominal pain, fever and chills. Elevated white blood cell count.  EXAM: CT ABDOMEN AND PELVIS WITH CONTRAST  TECHNIQUE: Multidetector CT imaging of the abdomen and pelvis was performed using the standard protocol following bolus administration of intravenous contrast.  CONTRAST:  OMNIPAQUE IOHEXOL 300 MG/ML  SOLN  COMPARISON:  12/12/2005  FINDINGS: The lung bases are clear.  The liver is unremarkable. No focal hepatic lesions or intrahepatic biliary dilatation. The gallbladder is surgically absent. No common bile duct dilatation. The pancreas is normal. The spleen is normal. The adrenal glands and kidneys are normal.  There are  surgical changes related to a gastric banding. This appears to be in good position without complicating features. The stomach, duodenum, small bowel and colon are unremarkable. No inflammatory changes or mass lesions. No obstructive findings. There are scattered colonic diverticuli.  The aorta is normal in caliber. The major branch vessels are normal. The portal, splenic and renal veins are patent.  There is a complex abscess in the left pelvis adjacent to the left ovary. It measures approximately 6.8 x 3.3 cm. The uterus is surgically absent. I believe the right ovary has been surgically removed but there is a small residual tear ovarian cyst noted on image number 65.  The bony structures are unremarkable. Disc spaces are maintained. The hip joints are maintained.  IMPRESSION: 6.8 x 3.3  cm left pelvic abscess of uncertain origin. It is possible this is a infected postoperative hematoma. I do not think this is a diverticular abscess or a tubo-ovarian abscess.   Electronically Signed   By: Loralie Champagne M.D.   On: 05/18/2013 15:37    EKG Interpretation   None       MDM   1. Pelvic abscess in female    12:28 PM 42 y.o. female who presents with intermittent left lower quadrant pain for approximately 6 days. The patient has also had waxing waning fever with a temperature this morning of 100.8. The patient states that she saw her physician 5 days ago and was diagnosed with UTI. At that time she was having vomiting and was going into the office to get daily Rocephin shots. She was here today for evaluation for possible diverticulitis due to ongoing pain particularly with eating. She is afebrile and vital signs are unremarkable here. Will get lab work and CT scan.  Pt found to have a left pelvic abscess. Discussed the case w/ Dr. Robina Ade at [facility]. I will give zosyn IV x 1 here and transfer the pt to [facility].     Junius Argyle, MD 05/18/13 [phone]

## 2013-05-18 NOTE — ED Notes (Signed)
Pt states her headache is returning. Requesting more pain meds. MD aware.

## 2013-05-18 NOTE — ED Notes (Signed)
Bed Placement from [facility] called to assign pt a room. She will be going to [facility] Room 9.

## 2013-05-18 NOTE — ED Notes (Signed)
[facility] here to transport pt 

## 2013-05-18 NOTE — ED Notes (Signed)
Report given to Vonna Kotyk, RN from [place].

## 2013-05-18 NOTE — ED Notes (Signed)
Patient transported to CT 

## 2013-05-18 NOTE — ED Notes (Signed)
Pt reports fever, chills, body aches since Thursday; went to PCP and was diagnosed with UTI, was treated with Cipro, flagyl and rocephin, was sent from PCP to r/o diverticulitis.

## 2013-05-18 NOTE — ED Notes (Signed)
Attempted to call [facility] for report. RN unable to take report at this time. They will call back.

## 2013-05-18 NOTE — ED Notes (Signed)
Arrangements being made for pt to be transferred to [facility] Kitchen

## 2013-05-19 ENCOUNTER — Telehealth: Payer: Self-pay | Admitting: *Deleted

## 2013-05-19 NOTE — Telephone Encounter (Signed)
Pt called stating she was admitted to hospital last night due to pain in abdomen, all labs and imaging in system.

## 2013-05-28 ENCOUNTER — Other Ambulatory Visit: Payer: Self-pay

## 2013-06-12 ENCOUNTER — Telehealth: Payer: Self-pay | Admitting: *Deleted

## 2013-06-12 NOTE — Telephone Encounter (Signed)
Pt calling c/o brownish discharge and some left side discomfort that started early this am, but now has stopped. Pt will watch for now and make OV if this should return.

## 2013-06-23 ENCOUNTER — Other Ambulatory Visit: Payer: Self-pay

## 2013-06-23 DIAGNOSIS — Z803 Family history of malignant neoplasm of breast: Secondary | ICD-10-CM

## 2013-06-23 DIAGNOSIS — Z1231 Encounter for screening mammogram for malignant neoplasm of breast: Secondary | ICD-10-CM

## 2013-06-24 ENCOUNTER — Ambulatory Visit
Admission: RE | Admit: 2013-06-24 | Discharge: 2013-06-24 | Disposition: A | Discharge status: Home / Self Care | Payer: BC Managed Care – PPO | Source: Ambulatory Visit

## 2013-06-24 DIAGNOSIS — Z1231 Encounter for screening mammogram for malignant neoplasm of breast: Secondary | ICD-10-CM

## 2013-06-24 DIAGNOSIS — Z803 Family history of malignant neoplasm of breast: Secondary | ICD-10-CM

## 2013-09-29 ENCOUNTER — Other Ambulatory Visit (HOSPITAL_COMMUNITY)
Admission: RE | Admit: 2013-09-29 | Discharge: 2013-09-29 | Disposition: A | Discharge status: Home / Self Care | Payer: BC Managed Care – PPO | Source: Ambulatory Visit | Attending: Gynecology | Admitting: Gynecology

## 2013-09-29 ENCOUNTER — Ambulatory Visit (INDEPENDENT_AMBULATORY_CARE_PROVIDER_SITE_OTHER): Payer: BC Managed Care – PPO | Admitting: Gynecology

## 2013-09-29 ENCOUNTER — Encounter: Payer: Self-pay | Admitting: Gynecology

## 2013-09-29 DIAGNOSIS — Z01419 Encounter for gynecological examination (general) (routine) without abnormal findings: Secondary | ICD-10-CM | POA: Insufficient documentation

## 2013-09-29 DIAGNOSIS — C539 Malignant neoplasm of cervix uteri, unspecified: Secondary | ICD-10-CM

## 2013-09-29 DIAGNOSIS — Z1151 Encounter for screening for human papillomavirus (HPV): Secondary | ICD-10-CM | POA: Insufficient documentation

## 2013-09-29 MED ORDER — FLUCONAZOLE 200 MG PO TABS: 200.0000 mg | ORAL_TABLET | Freq: Every day | ORAL | Status: DC

## 2013-09-29 NOTE — Progress Notes (Signed)
Patient presents for followup. 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 Had a radical hysterectomy at [facility] 02/2013. Readmitted 2 months later with left pelvic abscess which was drained and treated with antibiotics. Doing well now. Alternating seen her [facility] physicians and myself every 3 months. Without complaints today.  Exam with [name] assistant Abdomen obese, soft nontender without masses guarding rebound organomegaly Pelvic external BUS vagina normal. Of cuff done. Bimanual without palpable abnormalities or tenderness. Rectovaginal exam is normal.  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 Exam NED. We'll continue to followup with her [place] in 3 months and their recommendations if any further testing needs to be done. Otherwise will see me in 6 months

## 2013-09-29 NOTE — Addendum Note (Signed)
Addended by: Nelva Nay on: 09/29/2013 11:41 AM   Modules accepted: Orders

## 2013-09-29 NOTE — Patient Instructions (Signed)
Office will contact you about support group information.

## 2013-09-30 ENCOUNTER — Encounter: Payer: Self-pay | Admitting: Gynecology

## 2013-09-30 ENCOUNTER — Telehealth: Payer: Self-pay | Admitting: *Deleted

## 2013-09-30 NOTE — Telephone Encounter (Signed)
Pt informed every 3 rd Monday of every month at [facility]. Gave her # to Ottis Stain [phone] if any further questions.

## 2013-09-30 NOTE — Telephone Encounter (Signed)
Left message on pt voicemail to call regarding this.

## 2013-09-30 NOTE — Telephone Encounter (Signed)
Message copied by Thamas Jaegers on Wed Sep 30, 2013  9:23 AM ------      Message from: Anastasio Auerbach      Created: Tue Sep 29, 2013 11:20 AM       Patient interested in a support group for cancer survivors at [place]. If they have a female group that would be better. Her diagnosis is cervical cancer. ------

## 2013-10-01 ENCOUNTER — Ambulatory Visit: Payer: BC Managed Care – PPO | Admitting: Gynecology

## 2014-05-17 ENCOUNTER — Ambulatory Visit: Payer: BC Managed Care – PPO | Admitting: Gynecology

## 2014-05-24 ENCOUNTER — Encounter: Payer: Self-pay | Admitting: Gynecology

## 2014-05-25 ENCOUNTER — Ambulatory Visit (INDEPENDENT_AMBULATORY_CARE_PROVIDER_SITE_OTHER): Payer: BC Managed Care – PPO | Admitting: Gynecology

## 2014-05-25 ENCOUNTER — Other Ambulatory Visit (HOSPITAL_COMMUNITY)
Admission: RE | Admit: 2014-05-25 | Discharge: 2014-05-25 | Disposition: A | Discharge status: Home / Self Care | Payer: BC Managed Care – PPO | Source: Ambulatory Visit | Attending: Gynecology | Admitting: Gynecology

## 2014-05-25 ENCOUNTER — Encounter: Payer: Self-pay | Admitting: Gynecology

## 2014-05-25 DIAGNOSIS — R87810 Cervical high risk human papillomavirus (HPV) DNA test positive: Secondary | ICD-10-CM | POA: Insufficient documentation

## 2014-05-25 DIAGNOSIS — Z01411 Encounter for gynecological examination (general) (routine) with abnormal findings: Secondary | ICD-10-CM | POA: Diagnosis not present

## 2014-05-25 DIAGNOSIS — Z1151 Encounter for screening for human papillomavirus (HPV): Secondary | ICD-10-CM | POA: Diagnosis present

## 2014-05-25 DIAGNOSIS — C539 Malignant neoplasm of cervix uteri, unspecified: Secondary | ICD-10-CM

## 2014-05-25 DIAGNOSIS — N644 Mastodynia: Secondary | ICD-10-CM

## 2014-05-25 DIAGNOSIS — Z23 Encounter for immunization: Secondary | ICD-10-CM

## 2014-05-25 LAB — TSH: TSH: 4.685 u[IU]/mL — ABNORMAL HIGH (ref 0.350–4.500)

## 2014-05-25 NOTE — Addendum Note (Signed)
Addended by: Nelva Nay on: 05/25/2014 05:00 PM   Modules accepted: Orders

## 2014-05-25 NOTE — Progress Notes (Signed)
Samantha Smith 12/02/1970 [id number]        43 y.o.  G3P0030 Presents for six-month follow up for her stage IB1 cervical cancer.  She is alternating between [place] and myself every 3 months. They told her at her last visit that the can space out to 6 months alternating between them and myself. Patient is having some bilateral breast tenderness that comes and goes but notes it seems worse in her right breast at the 12:00 position. No discharge or other symptoms. No palpable abnormalities on self breast exam. Most recent mammogram 06/2013 which was normal.  Not having hot flashes night sweats or other symptoms.  Past medical history,surgical history, problem list, medications, allergies, family history and social history were all reviewed and documented in the EPIC chart.  Directed ROS with pertinent positives and negatives documented in the history of present illness/assessment and plan.  Exam: [name] assistant General appearance:  Normal HEENT normal without evidence of thyroid abnormalities or cervical/supraclavicular adenopathy Both breasts examined lying and sitting without masses retractions discharge adenopathy. The area she is pointing to is at the junction of the chest wall and superior aspect of her right breast is being the most tender intermittently. Pelvic external BUS vagina normal. Pap/HPV of the vaginal cuff. Bimanual without masses or tenderness. Rectovaginal exam normal  Assessment/Plan:  43 y.o. G3P0030 with: 1. Stage IB1 cervical cancer. Exam NED. Pap/HPV of the vaginal cuff done. Will follow up in 6 months for examination with Dr. Maudie Mercury at [facility]. Bilateral mastalgia right greater than left intermittently consistent with hormonal changes. Exam without abnormalities. Will check baseline labs to include prolactin TSH FSH at her request. Will plan diagnostic mammogram and ultrasound over the right upper breast for completeness. Assuming negative studies and plan  expected management with SBE monthly.  I suspect the discomfort she is feeling his hormonal.     Anastasio Auerbach MD, 4:37 PM 05/25/2014

## 2014-05-25 NOTE — Patient Instructions (Signed)
Office will contact you to arrange mammogram and ultrasound. 

## 2014-05-26 LAB — FOLLICLE STIMULATING HORMONE: FSH: 10.7 m[IU]/mL

## 2014-05-26 LAB — PROLACTIN: PROLACTIN: 18.4 ng/mL

## 2014-05-27 ENCOUNTER — Other Ambulatory Visit: Payer: Self-pay | Admitting: Gynecology

## 2014-05-27 ENCOUNTER — Telehealth: Payer: Self-pay

## 2014-05-27 DIAGNOSIS — R7989 Other specified abnormal findings of blood chemistry: Secondary | ICD-10-CM

## 2014-05-27 DIAGNOSIS — N644 Mastodynia: Secondary | ICD-10-CM

## 2014-05-27 LAB — CYTOLOGY - PAP

## 2014-05-27 MED ORDER — LEVOTHYROXINE SODIUM 50 MCG PO TABS: 50.0000 ug | ORAL_TABLET | Freq: Every day | ORAL | Status: DC

## 2014-05-27 NOTE — Telephone Encounter (Signed)
Per Dr. Dorette Grate staff message "Schedule diagnostic mammography right breast ultrasound reference bilateral mastalgia right greater than left in the upper mid right breast region. No palpable abnormalities on physician exam. " I scheduled patient at [facility] for 06/08/14 at 10:30am. Patient was provided address and phone number in addition to time/date of appt.  Dr. Loetta Rough thought this appointment was acceptable as did the patient.

## 2014-06-01 ENCOUNTER — Encounter: Payer: Self-pay | Admitting: Gynecology

## 2014-06-01 ENCOUNTER — Ambulatory Visit (INDEPENDENT_AMBULATORY_CARE_PROVIDER_SITE_OTHER): Payer: BC Managed Care – PPO | Admitting: Gynecology

## 2014-06-01 DIAGNOSIS — N764 Abscess of vulva: Secondary | ICD-10-CM

## 2014-06-01 NOTE — Progress Notes (Signed)
Samantha Smith 1971/02/04 [id number]        43 y.o.  G3P0030 Presents with several days of vulvar soreness with lump noted. It started draining this morning.  Past medical history,surgical history, problem list, medications, allergies, family history and social history were all reviewed and documented in the EPIC chart.  Directed ROS with pertinent positives and negatives documented in the history of present illness/assessment and plan.  Exam: [name] assistant General appearance:  Normal External BUS vagina with small boil-like area outer mid right labia minora, draining. Vagina normal. Bimanual without masses or tenderness.  Assessment/Plan:  43 y.o. G3P0030 small draining boil right labia minora. Recommend sitz baths with hairdryer drying afterwards.  Follow up at the area persists or recurs. She did start on her Synthroid 50 g since that she feels better. She'll return in one month to have her TSH rechecked. Also reviewed her normal Pap smear from a cytology standpoint. It did show positive high-risk HPV and I have asked to subtype it.     Anastasio Auerbach MD, 8:49 AM 06/01/2014

## 2014-06-01 NOTE — Patient Instructions (Signed)
Use warm soaks to the sore area with hair dryer drying afterwards. Follow up if this area does not resolve. Follow up in one month or so for recheck of your thyroid.

## 2014-06-03 ENCOUNTER — Other Ambulatory Visit: Payer: Self-pay

## 2014-06-03 ENCOUNTER — Other Ambulatory Visit: Payer: Self-pay | Admitting: Gynecology

## 2014-06-03 DIAGNOSIS — N644 Mastodynia: Secondary | ICD-10-CM

## 2014-06-08 ENCOUNTER — Ambulatory Visit
Admission: RE | Admit: 2014-06-08 | Discharge: 2014-06-08 | Disposition: A | Discharge status: Home / Self Care | Payer: BC Managed Care – PPO | Source: Ambulatory Visit | Attending: Gynecology | Admitting: Gynecology

## 2014-06-08 DIAGNOSIS — N644 Mastodynia: Secondary | ICD-10-CM

## 2014-06-12 ENCOUNTER — Encounter (HOSPITAL_COMMUNITY): Payer: Self-pay | Admitting: *Deleted

## 2014-06-12 ENCOUNTER — Emergency Department (HOSPITAL_COMMUNITY): Payer: BC Managed Care – PPO

## 2014-06-12 ENCOUNTER — Inpatient Hospital Stay (HOSPITAL_COMMUNITY)
Admission: EM | Admit: 2014-06-12 | Discharge: 2014-06-18 | DRG: 493 | Disposition: A | Discharge status: Home Health | Payer: BC Managed Care – PPO | Attending: Orthopaedic Surgery | Admitting: Orthopaedic Surgery

## 2014-06-12 DIAGNOSIS — S82891A Other fracture of right lower leg, initial encounter for closed fracture: Secondary | ICD-10-CM | POA: Diagnosis present

## 2014-06-12 DIAGNOSIS — Z8541 Personal history of malignant neoplasm of cervix uteri: Secondary | ICD-10-CM

## 2014-06-12 DIAGNOSIS — S82401A Unspecified fracture of shaft of right fibula, initial encounter for closed fracture: Secondary | ICD-10-CM

## 2014-06-12 DIAGNOSIS — S82892A Other fracture of left lower leg, initial encounter for closed fracture: Secondary | ICD-10-CM

## 2014-06-12 DIAGNOSIS — S92312A Displaced fracture of first metatarsal bone, left foot, initial encounter for closed fracture: Secondary | ICD-10-CM | POA: Diagnosis present

## 2014-06-12 DIAGNOSIS — Z6841 Body Mass Index (BMI) 40.0 and over, adult: Secondary | ICD-10-CM

## 2014-06-12 DIAGNOSIS — S93325A Dislocation of tarsometatarsal joint of left foot, initial encounter: Secondary | ICD-10-CM

## 2014-06-12 DIAGNOSIS — S8262XA Displaced fracture of lateral malleolus of left fibula, initial encounter for closed fracture: Secondary | ICD-10-CM | POA: Diagnosis not present

## 2014-06-12 DIAGNOSIS — I959 Hypotension, unspecified: Secondary | ICD-10-CM

## 2014-06-12 DIAGNOSIS — S82402A Unspecified fracture of shaft of left fibula, initial encounter for closed fracture: Secondary | ICD-10-CM

## 2014-06-12 DIAGNOSIS — E079 Disorder of thyroid, unspecified: Secondary | ICD-10-CM | POA: Diagnosis present

## 2014-06-12 DIAGNOSIS — T1490 Injury, unspecified: Secondary | ICD-10-CM | POA: Diagnosis not present

## 2014-06-12 DIAGNOSIS — S92322A Displaced fracture of second metatarsal bone, left foot, initial encounter for closed fracture: Secondary | ICD-10-CM | POA: Diagnosis present

## 2014-06-12 DIAGNOSIS — Z419 Encounter for procedure for purposes other than remedying health state, unspecified: Secondary | ICD-10-CM

## 2014-06-12 DIAGNOSIS — T1490XA Injury, unspecified, initial encounter: Secondary | ICD-10-CM

## 2014-06-12 DIAGNOSIS — W1830XA Fall on same level, unspecified, initial encounter: Secondary | ICD-10-CM | POA: Diagnosis present

## 2014-06-12 MED ORDER — HYDROMORPHONE HCL 1 MG/ML IJ SOLN
1.0000 mg | Freq: Once | INTRAMUSCULAR | Status: AC
  Administered 2014-06-12: 1 mg via INTRAVENOUS
  Filled 2014-06-12: qty 1

## 2014-06-12 MED ORDER — ONDANSETRON HCL 4 MG/2ML IJ SOLN
4.0000 mg | Freq: Once | INTRAMUSCULAR | Status: AC
  Administered 2014-06-12: 4 mg via INTRAVENOUS
  Filled 2014-06-12: qty 2

## 2014-06-12 MED ORDER — MORPHINE SULFATE 4 MG/ML IJ SOLN
4.0000 mg | Freq: Once | INTRAMUSCULAR | Status: AC
  Administered 2014-06-12: 4 mg via INTRAVENOUS
  Filled 2014-06-12: qty 1

## 2014-06-12 MED ORDER — ONDANSETRON 4 MG PO TBDP: 4.0000 mg | ORAL_TABLET | Freq: Three times a day (TID) | ORAL | Status: DC | PRN

## 2014-06-12 MED ORDER — KETOROLAC TROMETHAMINE 30 MG/ML IJ SOLN
30.0000 mg | Freq: Once | INTRAMUSCULAR | Status: AC
  Administered 2014-06-12: 30 mg via INTRAVENOUS
  Filled 2014-06-12: qty 1

## 2014-06-12 MED ORDER — OXYCODONE-ACETAMINOPHEN 5-325 MG PO TABS: 2.0000 | ORAL_TABLET | ORAL | Status: DC | PRN

## 2014-06-12 NOTE — ED Notes (Signed)
EMS reports pt stepped off curb injury to both ankles, small amt of swelling, pt yelling with pain, Fentynal 250 mcg given by EMS. + pulse, wiggles toes, skin warm and dry, +cap refills

## 2014-06-12 NOTE — ED Notes (Signed)
Rodena Piety tech at bedside.

## 2014-06-12 NOTE — ED Notes (Signed)
AVS explained in detail. Advised not to drink/drive with medication. Advised to follow up with orthopedics ASAP. Given specific splint care. No other questions/concerns.

## 2014-06-12 NOTE — ED Notes (Signed)
Bed: FP69 Expected date:  Expected time:  Means of arrival:  Comments: Misstep bil ankle pain

## 2014-06-12 NOTE — ED Provider Notes (Signed)
CSN: [id number]     Arrival date & time 06/12/14  1647 History   First MD Initiated Contact with Patient 06/12/14 1716     Chief Complaint  Patient presents with  . Ankle Pain    bil     (Consider location/radiation/quality/duration/timing/severity/associated sxs/prior Treatment) HPI Comments: Patient is a 43 year old female who presents to the ED via EMS with bilateral ankle pain that started prior to arrival when she stepped off a curb and twisted both ankles. The pain started suddenly and remained constant since the onset. The pain is throbbing and severe and located in both ankles without radiation. Movement makes the pain worse. Patient is unable to bear weight due to severe pain. She denies any other injury or head trauma. No alleviating factors.   Patient is a 43 y.o. female presenting with ankle pain.  Ankle Pain   Past Medical History  Diagnosis Date  . Positive PPD     s/p abx per patient  . Thyroid disease   . STD (sexually transmitted disease)     Chlamydia history  . Blurred vision   . Morbid obesity   . ASCUS (atypical squamous cells of undetermined significance) on Pap smear 12/2012  . 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 Cervical cancer 02/2013    Stage IB1 poorly differentiated squamous cell   Past Surgical History  Procedure Laterality Date  . Cholecystectomy  12/01  . Right fallopian tube tied  2005    RIGHT  . Laparoscopic gastric banding  2008    see OV 06/09/08- ?Malfunction  . Ectopic pregnancy surgery      LEFT  . Left fallopian tube removed  2011  . Brain tumor excision    . Laparoscopic radical total hysterectomy w/ node biopsy, right salpingectomy  02/2013    [facility]   Family History  Problem Relation Age of Onset  . Hypertension Father   . Coronary artery disease Neg Hx   . Stroke Neg Hx   . Colon cancer Neg Hx   . Breast cancer Neg Hx   . Heart disease Sister   . Diabetes Maternal Grandmother    History  Substance Use Topics  . Smoking  status: Never Smoker   . Smokeless tobacco: Not on file  . Alcohol Use: No   OB History    Gravida Para Term Preterm AB TAB SAB Ectopic Multiple Living   3    3   1   0     Review of Systems  Musculoskeletal: Positive for joint swelling and arthralgias.  All other systems reviewed and are negative.     Allergies  Tetracyclines & related and Adhesive  Home Medications   Prior to Admission medications   Medication Sig Start Date End Date Taking? Authorizing Provider  levothyroxine (SYNTHROID, LEVOTHROID) 50 MCG tablet Take 1 tablet (50 mcg total) by mouth daily. 05/27/14  Yes Anastasio Auerbach, MD  Multiple Vitamin (MULTIVITAMIN) capsule Take 1 capsule by mouth daily.   Yes Historical Provider, MD   BP 102/60 mmHg  Pulse 87  Temp(Src) 97.9 F (36.6 C) (Oral)  Resp 17  SpO2 100%  LMP 01/02/2013 Physical Exam  Constitutional: She is oriented to person, place, and time. She appears well-developed and well-nourished. No distress.  HENT:  Head: Normocephalic and atraumatic.  Eyes: Conjunctivae and EOM are normal.  Neck: Normal range of motion.  Cardiovascular: Normal rate, regular rhythm and intact distal pulses.  Exam reveals no gallop and no friction rub.  No murmur heard. Pulmonary/Chest: Effort normal and breath sounds normal. She has no wheezes. She has no rales. She exhibits no tenderness.  Abdominal: Soft. There is no tenderness.  Musculoskeletal: Normal range of motion.  Right lateral ankle tenderness to palpation with associated edema. No obvious deformity. Slightly limited ROM of right ankle due to pain.   Left lateral ankle tenderness to light palpation with prominent swelling. No obvious deformity. Extreme tenderness to light palpation of entire left ankle and proximal foot. Severely limited ROM due to pain.   Neurological: She is alert and oriented to person, place, and time. Coordination normal.  Speech is goal-oriented. Moves limbs without ataxia.   Skin: Skin  is warm and dry.  Psychiatric: She has a normal mood and affect. Her behavior is normal.  Nursing note and vitals reviewed.   ED Course  Procedures (including critical care time) Labs Review Labs Reviewed - No data to display  Imaging Review Dg Ankle Complete Left  06/12/2014   CLINICAL DATA:  Fall with left ankle injury, pain and swelling.  EXAM: LEFT ANKLE COMPLETE - 3+ VIEW  COMPARISON:  None.  FINDINGS: A nondisplaced oblique fracture of the distal fibula is noted. Overlying soft tissue swelling is noted.  There is no evidence of subluxation or dislocation.  The talus is unremarkable.  Chronic changes along the medial ankle are present.  IMPRESSION: Oblique distal fibular fracture.   Electronically Signed   By: Hassan Rowan M.D.   On: 06/12/2014 18:17   Dg Ankle Complete Right  06/12/2014   CLINICAL DATA:  Fall with right ankle injury and pain.  EXAM: RIGHT ANKLE - COMPLETE 3+ VIEW  COMPARISON:  None.  FINDINGS: An avulsion fracture off of the tip of the fibula is noted with overlying soft tissue swelling.  There is no evidence of subluxation or dislocation.  The talus is unremarkable.  No other bony abnormalities are noted.  IMPRESSION: Fibular tip avulsion fracture.   Electronically Signed   By: Hassan Rowan M.D.   On: 06/12/2014 18:18   Dg Foot Complete Left  06/12/2014   CLINICAL DATA:  she was just walking down a curve and her ankle gave up on her; most pain at left ankle  EXAM: LEFT FOOT - COMPLETE 3+ VIEW  COMPARISON:  Ankle film 06/12/2014  FINDINGS: There is irregularity along the lateral border of the medial cuneiform. There is poor alignment of the second metatarsal and the cuneiforms. The cuboid bone is irregular.  There is a distal fibular fracture noted.  Calcaneus normal.  IMPRESSION: Concern for Lisfranc injury of the midfoot with fracture of the medial cuneiform and subluxation of the tarsal metatarsal joint. Recommend CT of foot.  Findings conveyed toJOHN BEDNAR on 06/12/2014   at18:28.   Electronically Signed   By: Suzy Bouchard M.D.   On: 06/12/2014 94:85    SPLINT APPLICATION Date/Time: 4:62 PM Authorized by: Alvina Chou Consent: Verbal consent obtained. Risks and benefits: risks, benefits and alternatives were discussed Consent given by: patient Splint applied by: orthopedic technician Location details: right ankle Splint type: CAM walker Supplies used: CAM walker Post-procedure: The splinted body part was neurovascularly unchanged following the procedure. Patient tolerance: Patient tolerated the procedure well with no immediate complications.   SPLINT APPLICATION Date/Time: 7:03 PM Authorized by: Alvina Chou Consent: Verbal consent obtained. Risks and benefits: risks, benefits and alternatives were discussed Consent given by: patient Splint applied by: orthopedic technician Location details: left ankle Splint type: bulky jones dressing Post-procedure:  The splinted body part was neurovascularly unchanged following the procedure. Patient tolerance: Patient tolerated the procedure well with no immediate complications.      EKG Interpretation None      MDM   Final diagnoses:  Injury  Lisfranc's dislocation, left, initial encounter  Fibula fracture, right, closed, initial encounter  Fibula fracture, left, closed, initial encounter    6:58 PM Xrays show fibular tip avulsion fracture on the right and oblique distal fibular fracture on the left with concern for lisfranc injury of the midfoot. Patient will have CT of left foot for further evaluation. No neurovascular compromise.   9:55 PM I spoke with Dr. Erlinda Hong who looked at the patient's xrays who recommends CAM walker on the right and bulky jones dressing on the left and non weight bearing. Patient will be discharged with Percocet for pain. Patient instructed to follow up in the office in 2-3 days.    .12:20 AM Patient unable to walk with splint and crutches. Patient will be  admitted for pain control and Dr. Erlinda Hong will see her in the morning.   Alvina Chou, PA-C 06/12/14 [place], PA-C 06/13/14 [facility], MD 06/17/14 [phone]

## 2014-06-12 NOTE — ED Notes (Signed)
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 Notified PA. PA to call orthopedic surgeon and re-evaluate.

## 2014-06-13 ENCOUNTER — Encounter (HOSPITAL_COMMUNITY): Payer: Self-pay | Admitting: Internal Medicine

## 2014-06-13 DIAGNOSIS — I959 Hypotension, unspecified: Secondary | ICD-10-CM

## 2014-06-13 DIAGNOSIS — W1830XA Fall on same level, unspecified, initial encounter: Secondary | ICD-10-CM | POA: Diagnosis present

## 2014-06-13 DIAGNOSIS — S92322A Displaced fracture of second metatarsal bone, left foot, initial encounter for closed fracture: Secondary | ICD-10-CM | POA: Diagnosis present

## 2014-06-13 DIAGNOSIS — S92312A Displaced fracture of first metatarsal bone, left foot, initial encounter for closed fracture: Secondary | ICD-10-CM | POA: Diagnosis present

## 2014-06-13 DIAGNOSIS — Z6841 Body Mass Index (BMI) 40.0 and over, adult: Secondary | ICD-10-CM | POA: Diagnosis not present

## 2014-06-13 DIAGNOSIS — S82892A Other fracture of left lower leg, initial encounter for closed fracture: Secondary | ICD-10-CM

## 2014-06-13 DIAGNOSIS — S82891A Other fracture of right lower leg, initial encounter for closed fracture: Secondary | ICD-10-CM | POA: Diagnosis present

## 2014-06-13 DIAGNOSIS — S8262XA Displaced fracture of lateral malleolus of left fibula, initial encounter for closed fracture: Secondary | ICD-10-CM | POA: Diagnosis present

## 2014-06-13 DIAGNOSIS — Z8541 Personal history of malignant neoplasm of cervix uteri: Secondary | ICD-10-CM | POA: Diagnosis not present

## 2014-06-13 DIAGNOSIS — T1490 Injury, unspecified: Secondary | ICD-10-CM | POA: Diagnosis present

## 2014-06-13 LAB — CBC
HCT: 42.6 % (ref 36.0–46.0)
HEMATOCRIT: 37.4 % (ref 36.0–46.0)
HEMOGLOBIN: 12 g/dL (ref 12.0–15.0)
HEMOGLOBIN: 13.9 g/dL (ref 12.0–15.0)
MCH: 31.4 pg (ref 26.0–34.0)
MCH: 31.5 pg (ref 26.0–34.0)
MCHC: 32.1 g/dL (ref 30.0–36.0)
MCHC: 32.6 g/dL (ref 30.0–36.0)
MCV: 96.6 fL (ref 78.0–100.0)
MCV: 97.9 fL (ref 78.0–100.0)
Platelets: 238 10*3/uL (ref 150–400)
Platelets: 277 10*3/uL (ref 150–400)
RBC: 3.82 MIL/uL — ABNORMAL LOW (ref 3.87–5.11)
RBC: 4.41 MIL/uL (ref 3.87–5.11)
RDW: 13.1 % (ref 11.5–15.5)
RDW: 13.2 % (ref 11.5–15.5)
WBC: 7.8 10*3/uL (ref 4.0–10.5)
WBC: 9.8 10*3/uL (ref 4.0–10.5)

## 2014-06-13 LAB — BASIC METABOLIC PANEL
Anion gap: 13 (ref 5–15)
BUN: 11 mg/dL (ref 6–23)
CHLORIDE: 101 meq/L (ref 96–112)
CO2: 22 meq/L (ref 19–32)
CREATININE: 0.68 mg/dL (ref 0.50–1.10)
Calcium: 8.8 mg/dL (ref 8.4–10.5)
GFR calc Af Amer: >90 mL/min (ref >90)
GFR calc non Af Amer: >90 mL/min (ref >90)
Glucose, Bld: 103 mg/dL — ABNORMAL HIGH (ref 70–99)
Potassium: 4.1 mEq/L (ref 3.7–5.3)
Sodium: 136 mEq/L — ABNORMAL LOW (ref 137–147)

## 2014-06-13 LAB — TSH: TSH: 5.68 u[IU]/mL — ABNORMAL HIGH (ref 0.350–4.500)

## 2014-06-13 MED ORDER — HEPARIN SODIUM (PORCINE) 5000 UNIT/ML IJ SOLN
5000.0000 [IU] | Freq: Three times a day (TID) | INTRAMUSCULAR | Status: AC
  Administered 2014-06-13 – 2014-06-15 (×9): 5000 [IU] via SUBCUTANEOUS
  Filled 2014-06-13 (×11): qty 1

## 2014-06-13 MED ORDER — DOCUSATE SODIUM 100 MG PO CAPS
100.0000 mg | ORAL_CAPSULE | Freq: Two times a day (BID) | ORAL | Status: DC
  Administered 2014-06-13 – 2014-06-18 (×9): 100 mg via ORAL
  Filled 2014-06-13 (×9): qty 1

## 2014-06-13 MED ORDER — ACETAMINOPHEN 325 MG PO TABS: 650.0000 mg | ORAL_TABLET | Freq: Four times a day (QID) | ORAL | Status: DC | PRN

## 2014-06-13 MED ORDER — SODIUM CHLORIDE 0.9 % IV SOLN: 250.0000 mL | INTRAVENOUS | Status: DC | PRN

## 2014-06-13 MED ORDER — NALOXONE HCL 0.4 MG/ML IJ SOLN
0.4000 mg | Freq: Once | INTRAMUSCULAR | Status: AC
  Administered 2014-06-13: 0.4 mg via INTRAVENOUS
  Filled 2014-06-13: qty 1

## 2014-06-13 MED ORDER — SODIUM CHLORIDE 0.9 % IJ SOLN
3.0000 mL | Freq: Two times a day (BID) | INTRAMUSCULAR | Status: DC
  Administered 2014-06-13 – 2014-06-15 (×7): 3 mL via INTRAVENOUS

## 2014-06-13 MED ORDER — ONDANSETRON HCL 4 MG/2ML IJ SOLN: 4.0000 mg | Freq: Four times a day (QID) | INTRAMUSCULAR | Status: DC | PRN

## 2014-06-13 MED ORDER — SODIUM CHLORIDE 0.9 % IV BOLUS (SEPSIS)
1000.0000 mL | Freq: Once | INTRAVENOUS | Status: AC
  Administered 2014-06-13: 1000 mL via INTRAVENOUS

## 2014-06-13 MED ORDER — ONDANSETRON HCL 4 MG PO TABS: 4.0000 mg | ORAL_TABLET | Freq: Four times a day (QID) | ORAL | Status: DC | PRN

## 2014-06-13 MED ORDER — MORPHINE SULFATE 2 MG/ML IJ SOLN
2.0000 mg | INTRAMUSCULAR | Status: DC | PRN
  Administered 2014-06-13 (×4): 2 mg via INTRAVENOUS
  Filled 2014-06-13 (×4): qty 1

## 2014-06-13 MED ORDER — HYDROCODONE-ACETAMINOPHEN 5-325 MG PO TABS
1.0000 | ORAL_TABLET | ORAL | Status: DC | PRN
  Administered 2014-06-13: 1 via ORAL
  Administered 2014-06-13: 2 via ORAL
  Administered 2014-06-13 (×3): 1 via ORAL
  Administered 2014-06-13 – 2014-06-14 (×4): 2 via ORAL
  Administered 2014-06-14: 1 via ORAL
  Administered 2014-06-15 – 2014-06-16 (×3): 2 via ORAL
  Filled 2014-06-13: qty 2
  Filled 2014-06-13: qty 1
  Filled 2014-06-13 (×3): qty 2
  Filled 2014-06-13: qty 1
  Filled 2014-06-13 (×2): qty 2
  Filled 2014-06-13: qty 1
  Filled 2014-06-13 (×3): qty 2
  Filled 2014-06-13: qty 1

## 2014-06-13 MED ORDER — MORPHINE SULFATE 2 MG/ML IJ SOLN
2.0000 mg | INTRAMUSCULAR | Status: DC | PRN
  Administered 2014-06-13 – 2014-06-16 (×10): 2 mg via INTRAVENOUS
  Filled 2014-06-13 (×11): qty 1

## 2014-06-13 MED ORDER — SODIUM CHLORIDE 0.9 % IJ SOLN: 3.0000 mL | INTRAMUSCULAR | Status: DC | PRN

## 2014-06-13 MED ORDER — HYDROMORPHONE HCL 1 MG/ML IJ SOLN
1.0000 mg | INTRAMUSCULAR | Status: DC | PRN
Start: 2014-06-13 — End: 2014-06-13

## 2014-06-13 MED ORDER — ACETAMINOPHEN 650 MG RE SUPP
650.0000 mg | Freq: Four times a day (QID) | RECTAL | Status: DC | PRN
Start: 2014-06-13 — End: 2014-06-18

## 2014-06-13 MED ORDER — ONDANSETRON HCL 4 MG/2ML IJ SOLN: 4.0000 mg | Freq: Three times a day (TID) | INTRAMUSCULAR | Status: DC | PRN

## 2014-06-13 MED ORDER — LEVOTHYROXINE SODIUM 50 MCG PO TABS
50.0000 ug | ORAL_TABLET | Freq: Every day | ORAL | Status: DC
Start: 2014-06-13 — End: 2014-06-18
  Administered 2014-06-13 – 2014-06-18 (×5): 50 ug via ORAL
  Filled 2014-06-13 (×7): qty 1

## 2014-06-13 NOTE — Progress Notes (Signed)
Patient seen and examined this morning, appreciate Dr. Phoebe Sharps input. It appears that she may need surgery on the left, probably on Wed per Dr. Erlinda Hong, will ask PT to see and focus on pain control now.   Kalany Diekmann M. Cruzita Lederer, MD [facility] [phone]

## 2014-06-13 NOTE — Progress Notes (Signed)
CT scan reviewed.  Recommend surgical treatment of left foot and ankle. Nonop for right ankle. NWB LLE WBAT RLE in camwalker Surgery will be scheduled for wed.  Patient may go home.  Ortho will contact patient about surgery. This was discussed with patient over phone.  Azucena Cecil, MD [place] 12:42 PM

## 2014-06-13 NOTE — Plan of Care (Signed)
Problem: Phase I Progression Outcomes Goal: Pain controlled with appropriate interventions Outcome: Completed/Met Date Met:  06/13/14     

## 2014-06-13 NOTE — H&P (Signed)
PCP:  Kathlene November, MD    Chief Complaint:  fall  HPI: Samantha Smith is a 43 y.o. 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   Presented with  Patient was walking outside and mis steped resulting in a fall she was having trouble ambulating and was seen at [facility] she was noted to have right Fibular tip avulsion fracture. And Left Comminuted intra-articular fracture of the distal fibula as described. Patient was unable to tolerate crutches due to pain. Patient is unable to ambulate.at home.  Hospitalist was called for admission for pain control. Note in emergency patient received 4 mg of morphine 3 as well as 1 mg of Dilaudid.  Patient was noted to be somewhat lethargic and decreased blood pressure down to 80s. This has improved and patient was woken up. Her blood pressure has improved to 120s once patient was given small dose of Narcan. Patient states her blood pressure usually runs a dose low to 90s. Currently the time of admission vital signs stable. ER M.D. he has spoken to Dr. Erlinda Hong was aware we'll see patient in the morning  Review of Systems:    Pertinent positives include:  Bilateral ankle pain  Constitutional:  No weight loss, night sweats, Fevers, chills, fatigue, weight loss  HEENT:  No headaches, Difficulty swallowing,Tooth/dental problems,Sore throat,  No sneezing, itching, ear ache, nasal congestion, post nasal drip,  Cardio-vascular:  No chest pain, Orthopnea, PND, anasarca, dizziness, palpitations.no Bilateral lower extremity swelling  GI:  No heartburn, indigestion, abdominal pain, nausea, vomiting, diarrhea, change in bowel habits, loss of appetite, melena, blood in stool, hematemesis Resp:  no shortness of breath at rest. No dyspnea on  exertion, No excess mucus, no productive cough, No non-productive cough, No coughing up of blood.No change in color of mucus.No wheezing. Skin:  no rash or lesions. No jaundice GU:  no dysuria, change in color of urine, no urgency or frequency. No straining to urinate.  No flank pain.  Musculoskeletal:  No joint pain or no joint swelling. No decreased range of motion. No back pain.  Psych:  No change in mood or affect. No depression or anxiety. No memory loss.  Neuro: no localizing neurological complaints, no tingling, no weakness, no double vision, no gait abnormality, no slurred speech, no confusion  Otherwise ROS are negative except for above, 10 systems were reviewed  Past Medical History: Past Medical History  Diagnosis Date  . Positive PPD     s/p abx per patient  . Thyroid disease   . STD (sexually transmitted disease)     Chlamydia history  . Blurred vision   . Morbid obesity   . ASCUS (atypical squamous cells of undetermined significance) on Pap smear 12/2012  . 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 Cervical cancer 02/2013    Stage IB1 poorly differentiated squamous cell   Past Surgical History  Procedure Laterality Date  . Cholecystectomy  12/01  . Right fallopian tube tied  2005    RIGHT  . Laparoscopic gastric banding  2008    see OV 06/09/08- ?Malfunction  . Ectopic pregnancy surgery      LEFT  . Left fallopian tube removed  2011  . Brain tumor excision    . Laparoscopic radical total hysterectomy w/ node biopsy, right salpingectomy  02/2013    [facility]     Medications: Prior to Admission medications   Medication Sig Start Date End Date Taking? Authorizing Provider  levothyroxine (SYNTHROID, LEVOTHROID) 50 MCG tablet Take 1 tablet (50 mcg total) by mouth daily. 05/27/14  Yes Anastasio Auerbach, MD  Multiple Vitamin (MULTIVITAMIN) capsule Take 1 capsule by mouth daily.   Yes Historical Provider, MD  ondansetron (ZOFRAN ODT) 4 MG disintegrating tablet Take 1 tablet (4  mg total) by mouth every 8 (eight) hours as needed for nausea or vomiting. 06/12/14   Alvina Chou, PA-C  oxyCODONE-acetaminophen (PERCOCET/ROXICET) 5-325 MG per tablet Take 2 tablets by mouth every 4 (four) hours as needed for moderate pain or severe pain. 06/12/14   Alvina Chou, PA-C    Allergies:   Allergies  Allergen Reactions  . Tetracyclines & Related Other (See Comments)    Pseudo tumor  . Adhesive [Tape] Hives    Social History:  Ambulatory   independently   Lives at home   With family     reports that she has never smoked. She does not have any smokeless tobacco history on file. She reports that she does not drink alcohol or use illicit drugs.    Family History: family history includes Diabetes in her maternal grandmother; Heart disease in her sister; Hypertension in her father. There is no history of Coronary artery disease, Stroke, Colon cancer, or Breast cancer.    Physical Exam: Patient Vitals for the past 24 hrs:  BP Temp Temp src Pulse Resp SpO2  06/12/14 2211 113/57 mmHg 98.4 F (36.9 C) Oral 69 18 96 %  06/12/14 1940 104/56 mmHg 98.9 F (37.2 C) Oral 88 24 95 %  06/12/14 1726 102/60 mmHg - - 87 17 100 %  06/12/14 1657 (!) 98/53 mmHg 97.9 F (36.6 C) Oral 67 17 100 %    1. General:  in No Acute distress 2. Psychological: Alert and   Oriented 3. Head/ENT:   Moist   Mucous Membranes                          Head Non traumatic, neck supple                          Normal Dentition 4. SKIN: normal   Skin turgor,  Skin clean Dry and intact no rash 5. Heart: Regular rate and rhythm no Murmur, Rub or gallop 6. Lungs: Clear to auscultation bilaterally, no wheezes or crackles   7. Abdomen: Soft, non-tender, Non distended obese 8. Lower extremities: no clubbing, cyanosis, left lower extremity in splint right lower extremity and fracture boot  9. Neurologically Grossly intact, moving all 4 extremities equally 10. MSK: Normal range of motion  body  mass index is unknown because there is no weight on file.   Labs on Admission:   No results found for this or any previous visit (from the past 24 hour(s)).  UA not obtained  Lab Results  Component Value Date   HGBA1C 5.0 01/15/2013    CrCl cannot be calculated (Unknown ideal weight.).  BNP (last 3 results) No results for input(s): PROBNP in the last 8760 hours.  There were no vitals filed for this visit.   Cultures: No results found for: [place], [place], CULT, REPTSTATUS   Radiological Exams on Admission: Dg Ankle Complete Left  06/12/2014   CLINICAL DATA:  Fall with left ankle injury, pain and swelling.  EXAM: LEFT ANKLE COMPLETE - 3+ VIEW  COMPARISON:  None.  FINDINGS: A nondisplaced oblique fracture of the distal fibula is noted. Overlying soft tissue swelling is noted.  There is no evidence of subluxation or dislocation.  The talus is unremarkable.  Chronic changes along the medial ankle are present.  IMPRESSION: Oblique distal fibular fracture.   Electronically Signed   By: Hassan Rowan M.D.   On: 06/12/2014 18:17   Dg Ankle Complete Right  06/12/2014   CLINICAL DATA:  Fall with right ankle injury and pain.  EXAM: RIGHT ANKLE - COMPLETE 3+ VIEW  COMPARISON:  None.  FINDINGS: An avulsion fracture off of the tip of the fibula is noted with overlying soft tissue swelling.  There is no evidence of subluxation or dislocation.  The talus is unremarkable.  No other bony abnormalities are noted.  IMPRESSION: Fibular tip avulsion fracture.   Electronically Signed   By: Hassan Rowan M.D.   On: 06/12/2014 18:18   Ct Foot Left Wo Contrast  06/12/2014   CLINICAL DATA:  Left foot and ankle pain following twisting injury. Evaluate for Lisfranc injury. Initial encounter.  EXAM: CT OF THE LEFT FOOT WITHOUT CONTRAST  TECHNIQUE: Multidetector CT imaging of the left foot was performed according to the standard protocol. Multiplanar CT image reconstructions were also generated.  COMPARISON:   Radiographs same date.  FINDINGS: There is a comminuted and mildly displaced intra-articular fracture of the distal fibula extending proximal to the ankle mortise. This fracture extends into the distal tibia fibular joint which is not significantly widened. The distal tibia is intact. The talar dome, subtalar joint and calcaneus are intact.  There is a Lisfranc joint injury with lateral subluxation of the first and second metatarsal bases. There are mildly comminuted intra-articular fractures involving the bases of the first and second metatarsals, all of the cuneiform bones and the cuboid. The navicular bone is intact.  Beyond the bases, the metatarsals are intact. The digits appear intact.  There is lateral soft tissue swelling at the ankle extending into the dorsum of the midfoot. The ankle tendons appear intact.  IMPRESSION: 1. Comminuted intra-articular fracture of the distal fibula as described. There is no widening of the ankle mortise, distal tibial or talar dome injury. 2. Lisfranc fracture subluxation injury as described with fractures of the first and second metatarsal bases, the cuneiform bones and the cuboid.   Electronically Signed   By: Camie Patience M.D.   On: 06/12/2014 19:49   Dg Foot Complete Left  06/12/2014   CLINICAL DATA:  she was just walking down a curve and her ankle gave up on her; most pain at left ankle  EXAM: LEFT FOOT - COMPLETE 3+ VIEW  COMPARISON:  Ankle film 06/12/2014  FINDINGS: There is irregularity along the lateral border of the medial cuneiform. There is poor alignment of the second metatarsal and the cuneiforms. The cuboid bone is irregular.  There is a distal fibular fracture noted.  Calcaneus normal.  IMPRESSION: Concern for Lisfranc injury of the midfoot with fracture of the medial cuneiform and subluxation of the tarsal metatarsal joint. Recommend CT of foot.  Findings conveyed toJOHN BEDNAR on 06/12/2014  at18:28.   Electronically Signed   By: Suzy Bouchard M.D.    On: 06/12/2014 18:29    Chart has been reviewed  Assessment/Plan  43 year old female history of hypothyroidism here with bilateral ankle fracture inability to ambulate requiring pain management  Present on Admission:  . Thyroid disease -  continue Synthroid check TSH  . Bilateral ankle fractures - orthopedics is aware we'll seen the morning. Continue pain management. Continue with ankle splint   transient hypotension as a result of opioids given for pain control. We'll monitor overnight for observation, will give lower doses of opioids. Point is no evidence for infection  Prophylaxis:  Heparin subcutaneous  CODE STATUS:  FULL CODE    Other plan as per orders.  I have spent a total of 55 min on this admission  Karsen Nakanishi 06/13/2014, 12:03 AM  [facility]  Pager [phone]   after 2 AM please page floor coverage PA If 7AM-7PM, please contact the day team taking care of the patient  [email or web address]  Password [email or web address]

## 2014-06-13 NOTE — Evaluation (Signed)
Physical Therapy Evaluation Patient Details Name: Samantha Smith MRN: [id number] DOB: 05/18/1971 Today's Date: 06/13/2014   History of Present Illness  43 yo female adm after mechanical fall resulting in bil ankle injuries;  xray R ankle + fibula avulsion fx, cam boot, no sugery recommended ; L ankle lisfranc fx -- surgery pending  (possible surgery Wednesday)  PMHx: lap band in 2008, brain tumor excision  Clinical Impression  Pt will benefit from PT to address deficits below; Pt for possible ankle surgery on Wednesday; Pt will need HHPT, has 24 hr assist and a plethora of DME available if needed. Husband is very supportive; Pt is very painful a time of eval but motivated and cooperative;    Follow Up Recommendations Home health PT;Supervision for mobility/OOB    Equipment Recommendations  None recommended by PT    Recommendations for Other Services       Precautions / Restrictions Precautions Precautions: Fall Required Braces or Orthoses: Other Brace/Splint Other Brace/Splint: [place] on RLE Restrictions Weight Bearing Restrictions: Yes RLE Weight Bearing: Weight bearing as tolerated LLE Weight Bearing: Non weight bearing      Mobility  Bed Mobility Overal bed mobility: Needs Assistance Bed Mobility: Supine to Sit     Supine to sit: Min assist     General bed mobility comments: min with LLE mostly for pain control, incr time  Transfers Overall transfer level: Needs assistance Equipment used: Rolling walker (2 wheeled) Transfers: Sit to/from [facility] Sit to Stand: Mod assist;+2 safety/equipment Stand pivot transfers: Mod assist;Min assist;+2 safety/equipment       General transfer comment: assist with anterior superior wt shift, cues for hand placement, RW position and safety; pt dizzy upon inital standing but resovled after a few seconds;  Ambulation/Gait                Stairs            Wheelchair Mobility    Modified  [place] (Stroke Patients Only)       Balance Overall balance assessment: Needs assistance;History of Falls Sitting-balance support: No upper extremity supported Sitting balance-Leahy Scale: Good       Standing balance-Leahy Scale: Poor Standing balance comment: requires UE support to maintain balance                             Pertinent Vitals/Pain Pain Assessment: 0-10 Pain Score: 7  Pain Location: L greater than R Pain Intervention(s): Limited activity within patient's tolerance;Monitored during session;RN gave pain meds during session    [place] expects to be discharged to:: Private residence Living Arrangements: Spouse/significant other Available Help at Discharge: Family;Available 24 hours/day Type of Home: House Home Access: Stairs to enter Entrance Stairs-Rails: Psychiatric nurse of Steps: 3 Home Layout: Two level Home Equipment: Walker - 2 wheels;Crutches;Bedside commode;Wheelchair - manual Additional Comments: has access to knee scooter that was her mother's    Prior Function Level of Independence: Independent               Hand Dominance        Extremity/Trunk Assessment   Upper Extremity Assessment: RUE deficits/detail RUE Deficits / Details: bil UEs grossly 3+ to 4/5         Lower Extremity Assessment: RLE deficits/detail;LLE deficits/detail RLE Deficits / Details: at least 3/5 bil LEs       Communication   Communication: No difficulties  Cognition Arousal/Alertness: Awake/alert Behavior During Therapy: [facility]  for tasks assessed/performed Overall Cognitive Status: Within Functional Limits for tasks assessed                      General Comments General comments (skin integrity, edema, etc.): family encouraging pt to use knee scooter; discussed pros and cons with pt and do not feel she is ready for it from a pain control standpoint at this time, will continue to assess    Exercises         Assessment/Plan    PT Assessment Patient needs continued PT services  PT Diagnosis Difficulty walking;Acute pain   PT Problem List Decreased strength;Decreased activity tolerance;Decreased balance;Decreased mobility;Decreased knowledge of precautions;Decreased knowledge of use of DME  PT Treatment Interventions DME instruction;Gait training;Functional mobility training;Stair training;Therapeutic activities;Therapeutic exercise   PT Goals (Current goals can be found in the Care Plan section) Acute Rehab PT Goals Patient Stated Goal: return to I  PT Goal Formulation: With patient Time For Goal Achievement: 06/20/14 Potential to Achieve Goals: Good    Frequency 7X/week   Barriers to discharge        Co-evaluation               End of Session Equipment Utilized During Treatment: Gait belt Activity Tolerance: Patient limited by fatigue;Patient limited by pain Patient left: in chair;with call bell/phone within reach;with family/visitor present Nurse Communication: Mobility status    Functional Assessment Tool Used: clinical judgement Functional Limitation: Mobility: Walking and moving around Mobility: Walking and Moving Around Current Status [phone]): At least 20 percent but less than 40 percent impaired, limited or restricted Mobility: Walking and Moving Around Goal Status [phone]): At least 1 percent but less than 20 percent impaired, limited or restricted    Time: 1332-1410 PT Time Calculation (min) (ACUTE ONLY): 38 min   Charges:     PT Treatments $Gait Training: 8-22 mins $Therapeutic Activity: 23-37 mins   PT G Codes:   Functional Assessment Tool Used: clinical judgement Functional Limitation: Mobility: Walking and moving around    [facility] 06/13/2014, 2:25 PM

## 2014-06-13 NOTE — Consult Note (Signed)
ORTHOPAEDIC CONSULTATION  REQUESTING PHYSICIAN: Caren Griffins, MD  Chief Complaint: bilateral foot and ankle pain  HPI: Samantha Smith is a 43 y.o. female who complains of bilateral ankle and foot injuries s/p mechanical fall earlier yesterday.  Was evaluated at [facility].  Denies LOC, neck pain, abd pain.  Ortho consulted for injuries.  Past Medical History  Diagnosis Date  . Positive PPD     s/p abx per patient  . Thyroid disease   . STD (sexually transmitted disease)     Chlamydia history  . Blurred vision   . Morbid obesity   . ASCUS (atypical squamous cells of undetermined significance) on Pap smear 12/2012  . 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 Cervical cancer 02/2013    Stage IB1 poorly differentiated squamous cell   Past Surgical History  Procedure Laterality Date  . Cholecystectomy  12/01  . Right fallopian tube tied  2005    RIGHT  . Laparoscopic gastric banding  2008    see OV 06/09/08- ?Malfunction  . Ectopic pregnancy surgery      LEFT  . Left fallopian tube removed  2011  . Brain tumor excision    . Laparoscopic radical total hysterectomy w/ node biopsy, right salpingectomy  02/2013    [facility]   History   Social History  . Marital Status: Married    Spouse Name: N/A    Number of Children: N/A  . Years of Education: N/A   Occupational History  . Accountant and translator     Social History Main Topics  . Smoking status: Never Smoker   . Smokeless tobacco: None  . Alcohol Use: No  . Drug Use: No  . Sexual Activity: Yes    Birth Control/ Protection: Surgical     Comment: Left fall. tube removed-Right tube tied   Other Topics Concern  . None   Social History Narrative   Family History  Problem Relation Age of Onset  . Hypertension Father   . Coronary artery disease Neg Hx   . Stroke Neg Hx   . Colon cancer Neg Hx   . Breast cancer Neg Hx   . Heart disease Sister   . Diabetes Maternal Grandmother    Allergies  Allergen Reactions  .  Tetracyclines & Related Other (See Comments)    Pseudo tumor  . Adhesive [Tape] Hives   Prior to Admission medications   Medication Sig Start Date End Date Taking? Authorizing Provider  levothyroxine (SYNTHROID, LEVOTHROID) 50 MCG tablet Take 1 tablet (50 mcg total) by mouth daily. 05/27/14  Yes Anastasio Auerbach, MD  Multiple Vitamin (MULTIVITAMIN) capsule Take 1 capsule by mouth daily.   Yes Historical Provider, MD  ondansetron (ZOFRAN ODT) 4 MG disintegrating tablet Take 1 tablet (4 mg total) by mouth every 8 (eight) hours as needed for nausea or vomiting. 06/12/14   Alvina Chou, PA-C  oxyCODONE-acetaminophen (PERCOCET/ROXICET) 5-325 MG per tablet Take 2 tablets by mouth every 4 (four) hours as needed for moderate pain or severe pain. 06/12/14   Alvina Chou, PA-C   Dg Ankle Complete Left  06/12/2014   CLINICAL DATA:  Fall with left ankle injury, pain and swelling.  EXAM: LEFT ANKLE COMPLETE - 3+ VIEW  COMPARISON:  None.  FINDINGS: A nondisplaced oblique fracture of the distal fibula is noted. Overlying soft tissue swelling is noted.  There is no evidence of subluxation or dislocation.  The talus is unremarkable.  Chronic changes along the medial ankle are  present.  IMPRESSION: Oblique distal fibular fracture.   Electronically Signed   By: Hassan Rowan M.D.   On: 06/12/2014 18:17   Dg Ankle Complete Right  06/12/2014   CLINICAL DATA:  Fall with right ankle injury and pain.  EXAM: RIGHT ANKLE - COMPLETE 3+ VIEW  COMPARISON:  None.  FINDINGS: An avulsion fracture off of the tip of the fibula is noted with overlying soft tissue swelling.  There is no evidence of subluxation or dislocation.  The talus is unremarkable.  No other bony abnormalities are noted.  IMPRESSION: Fibular tip avulsion fracture.   Electronically Signed   By: Hassan Rowan M.D.   On: 06/12/2014 18:18   Ct Foot Left Wo Contrast  06/12/2014   CLINICAL DATA:  Left foot and ankle pain following twisting injury. Evaluate for  Lisfranc injury. Initial encounter.  EXAM: CT OF THE LEFT FOOT WITHOUT CONTRAST  TECHNIQUE: Multidetector CT imaging of the left foot was performed according to the standard protocol. Multiplanar CT image reconstructions were also generated.  COMPARISON:  Radiographs same date.  FINDINGS: There is a comminuted and mildly displaced intra-articular fracture of the distal fibula extending proximal to the ankle mortise. This fracture extends into the distal tibia fibular joint which is not significantly widened. The distal tibia is intact. The talar dome, subtalar joint and calcaneus are intact.  There is a Lisfranc joint injury with lateral subluxation of the first and second metatarsal bases. There are mildly comminuted intra-articular fractures involving the bases of the first and second metatarsals, all of the cuneiform bones and the cuboid. The navicular bone is intact.  Beyond the bases, the metatarsals are intact. The digits appear intact.  There is lateral soft tissue swelling at the ankle extending into the dorsum of the midfoot. The ankle tendons appear intact.  IMPRESSION: 1. Comminuted intra-articular fracture of the distal fibula as described. There is no widening of the ankle mortise, distal tibial or talar dome injury. 2. Lisfranc fracture subluxation injury as described with fractures of the first and second metatarsal bases, the cuneiform bones and the cuboid.   Electronically Signed   By: Camie Patience M.D.   On: 06/12/2014 19:49   Dg Foot Complete Left  06/12/2014   CLINICAL DATA:  she was just walking down a curve and her ankle gave up on her; most pain at left ankle  EXAM: LEFT FOOT - COMPLETE 3+ VIEW  COMPARISON:  Ankle film 06/12/2014  FINDINGS: There is irregularity along the lateral border of the medial cuneiform. There is poor alignment of the second metatarsal and the cuneiforms. The cuboid bone is irregular.  There is a distal fibular fracture noted.  Calcaneus normal.  IMPRESSION: Concern  for Lisfranc injury of the midfoot with fracture of the medial cuneiform and subluxation of the tarsal metatarsal joint. Recommend CT of foot.  Findings conveyed toJOHN BEDNAR on 06/12/2014  at18:28.   Electronically Signed   By: Suzy Bouchard M.D.   On: 06/12/2014 18:29    Positive ROS: All other systems have been reviewed and were otherwise negative with the exception of those mentioned in the HPI and as above.  Physical Exam: General: Alert, no acute distress Cardiovascular: No pedal edema Respiratory: No cyanosis, no use of accessory musculature GI: No organomegaly, abdomen is soft and non-tender Skin: No lesions in the area of chief complaint Neurologic: Sensation intact distally Psychiatric: Patient is competent for consent with normal mood and affect Lymphatic: No axillary or cervical lymphadenopathy  MUSCULOSKELETAL:  - skin intact - toes wwp - TTP over bilateral lateral malleolus and left midfoot  Assessment: 1. Right distal fibula avulsion fx 2. Left lateral malleolus fracture 3. Left bony lisfranc fx  Plan: - NWB LLE, bulky jones dressing ordered - WBAT RLE in cam walker - elevate BLE - will likely need surgical treatment of left lisfranc and ankle fx - up with PT - pain control - will plan for surgery Wednesday - will discuss findings with patient  Thank you for the consult and the opportunity to see [name]. Eduard Roux, MD [place] 10:49 AM

## 2014-06-13 NOTE — Progress Notes (Signed)
UR completed 

## 2014-06-14 DIAGNOSIS — S82891A Other fracture of right lower leg, initial encounter for closed fracture: Secondary | ICD-10-CM

## 2014-06-14 DIAGNOSIS — S82892A Other fracture of left lower leg, initial encounter for closed fracture: Secondary | ICD-10-CM

## 2014-06-14 DIAGNOSIS — E079 Disorder of thyroid, unspecified: Secondary | ICD-10-CM

## 2014-06-14 MED ORDER — OXYCODONE HCL 5 MG PO TABS
5.0000 mg | ORAL_TABLET | ORAL | Status: DC | PRN
  Administered 2014-06-14 – 2014-06-16 (×7): 10 mg via ORAL
  Filled 2014-06-14 (×9): qty 2

## 2014-06-14 MED ORDER — KETOROLAC TROMETHAMINE 30 MG/ML IJ SOLN
30.0000 mg | Freq: Four times a day (QID) | INTRAMUSCULAR | Status: DC | PRN
  Administered 2014-06-14 – 2014-06-15 (×3): 30 mg via INTRAVENOUS
  Filled 2014-06-14 (×4): qty 1

## 2014-06-14 MED ORDER — WHITE PETROLATUM GEL
Status: AC
  Administered 2014-06-14: 1
  Filled 2014-06-14: qty 5

## 2014-06-14 NOTE — Progress Notes (Signed)
Pt arrived to room 5N06 at this time from [facility]. Pt transferred over to the bed and vitals were obtained. Pts pain is at a tolerable level at this but i will continue to monitor Pt as well as her pain level.

## 2014-06-14 NOTE — Progress Notes (Signed)
   PROGRESS NOTE  SCARLETH BRAME KZS:[id number] DOB: 08-17-1970 DOA: 06/12/2014 PCP: Kathlene November, MD  HPI: 43 yo F with a fall and right Fibular tip avulsion fracture. And Left Comminuted intra-articular fracture of the distal fibula.  Subjective/ 24 H Interval events Unable to control her pain with oral agents and requiring IV  Assessment/Plan: Active Problems:   Thyroid disease   Bilateral ankle fractures   Lisfranc's sprain   Transient hypotension  Right fibular tip avulsion fracture and left comminuted intra-articular fracture of the distal fibula - Dr. Erlinda Hong plans operative repair on Wed on left, right is WBAT in camwalker - unable to control pain, patient will remain hospitalized, plan is for surgery at [facility] hospital, transfer today, patient will be transferred on orthopedic surgery service, appreciate assistance. Her medical problems are currently stable.   Elevated TSH - borderline, no need to adjust her synthroid now, to be repeated in 4-6 weeks as an outpatient.    Diet: regular Fluids: none  DVT Prophylaxis: heparin  Code Status: Full Family Communication: d/w husband  Disposition Plan: transfer to [facility]  Consultants:  Orthopedic surgery   Procedures:  None    Antibiotics None   Objective  Filed Vitals:   06/13/14 1436 06/13/14 2158 06/14/14 0548 06/14/14 0944  BP: 106/68 92/59 112/68 107/64  Pulse: 66 69 78 67  Temp: 97.6 F (36.4 C) 98.5 F (36.9 C) 98.7 F (37.1 C) 98.6 F (37 C)  TempSrc: Oral Oral Oral Oral  Resp: 16 18 18 18   Height:      Weight:      SpO2: 100% 99% 98% 96%    Intake/Output Summary (Last 24 hours) at 06/14/14 1012 Last data filed at 06/14/14 0900  Gross per 24 hour  Intake   1560 ml  Output      0 ml  Net   1560 ml   Filed Weights   06/13/14 0110  Weight: 121.4 kg (267 lb 10.2 oz)    Exam:  General:  NAD  Cardiovascular: RRR  Respiratory: CTA biL  Data Reviewed: Basic Metabolic Panel:  Recent Labs Lab  06/13/14 0016  NA 136*  K 4.1  CL 101  CO2 22  GLUCOSE 103*  BUN 11  CREATININE 0.68  CALCIUM 8.8   CBC:  Recent Labs Lab 06/13/14 0016 06/13/14 0602  WBC 9.8 7.8  HGB 13.9 12.0  HCT 42.6 37.4  MCV 96.6 97.9  PLT 277 238   Scheduled Meds: . docusate sodium  100 mg Oral BID  . heparin  5,000 Units Subcutaneous 3 times per day  . levothyroxine  50 mcg Oral QAC breakfast  . sodium chloride  3 mL Intravenous Q12H   Continuous Infusions:   Time spent: 15 minutes  Marzetta Board, MD [facility] Pager [phone]. If 7 PM - 7 AM, please contact night-coverage at [email or web address], password [facility] 06/14/2014, 10:12 AM  LOS: 2 days

## 2014-06-14 NOTE — H&P (Signed)
See ortho consult note

## 2014-06-14 NOTE — Plan of Care (Signed)
Problem: Phase III Progression Outcomes Goal: Dressing/splint clean, dry, intact Outcome: Completed/Met Date Met:  06/14/14

## 2014-06-14 NOTE — Plan of Care (Signed)
Problem: Phase II Progression Outcomes Goal: Post op CMS Neurovascular WDL Outcome: Completed/Met Date Met:  06/14/14 Goal: Tolerating diet Outcome: Completed/Met Date Met:  06/14/14

## 2014-06-14 NOTE — Progress Notes (Signed)
Pt transferring to 5N at [facility] for anticipated sx by Dr. Erlinda Hong on Wednesday. Patient medicated for pain before transport and report called to receiving RN, [place].

## 2014-06-15 MED ORDER — CHLORHEXIDINE GLUCONATE 4 % EX LIQD
60.0000 mL | Freq: Once | CUTANEOUS | Status: AC
  Administered 2014-06-16: 4 via TOPICAL
  Filled 2014-06-15: qty 60

## 2014-06-15 MED ORDER — SODIUM CHLORIDE 0.9 % IV SOLN
INTRAVENOUS | Status: DC
  Administered 2014-06-16: 01:00:00 via INTRAVENOUS

## 2014-06-15 MED ORDER — BISACODYL 5 MG PO TBEC
10.0000 mg | DELAYED_RELEASE_TABLET | Freq: Every day | ORAL | Status: DC | PRN
  Administered 2014-06-15: 10 mg via ORAL
  Filled 2014-06-15 (×2): qty 2

## 2014-06-15 MED ORDER — DEXTROSE 5 % IV SOLN
3.0000 g | INTRAVENOUS | Status: AC
  Administered 2014-06-16: 3 g via INTRAVENOUS
  Filled 2014-06-15: qty 3000

## 2014-06-15 NOTE — Plan of Care (Signed)
Problem: Phase III Progression Outcomes Goal: Pain controlled on oral analgesia Outcome: Progressing     

## 2014-06-15 NOTE — H&P (Signed)

## 2014-06-15 NOTE — Plan of Care (Signed)
Problem: Phase III Progression Outcomes Goal: Pain controlled on oral analgesia Outcome: Completed/Met Date Met:  06/15/14

## 2014-06-15 NOTE — Progress Notes (Signed)
Patient stable Plan for ORIF left foot and ankle tomorrow NPO after midnight  N. Eduard Roux, MD [place] [phone] 8:01 AM

## 2014-06-15 NOTE — Progress Notes (Signed)
Physical Therapy Treatment Patient Details Name: Samantha Smith MRN: [id number] DOB: Mar 14, 1971 Today's Date: 06/15/2014    History of Present Illness 43 yo female adm after mechanical fall resulting in bil ankle injuries;  xray R ankle + fibula avulsion fx, cam boot, no sugery recommended ; L ankle lisfranc fx -- surgery pending  (possible surgery Wednesday)  PMHx: lap band in 2008, brain tumor excision    PT Comments    Pt is progressing well with her mobility.  She only requires one person min assist with RW for safe transfers and is able to consistently maintain NWB on her left foot during transitions.  She was very tearful throughout the session and I am not sure it was due to pain, anxiety, or other emotional components.  She declined when I offered to call the RN for pain medicine stating that it did not hurt when she didn't move.  I encouraged her to take some pain medication as she was going to have to move more and more as she recovered and we did not want mobility to be unbearable.         Follow Up Recommendations  Home health PT;Supervision for mobility/OOB     Equipment Recommendations  None recommended by PT    Recommendations for Other Services   NA     Precautions / Restrictions Precautions Precautions: Fall Precaution Comments: due to NWB status of left foot.  Required Braces or Orthoses: Other Brace/Splint Other Brace/Splint: [place] on RLE Restrictions RLE Weight Bearing: Weight bearing as tolerated (in CAM boot) LLE Weight Bearing: Non weight bearing    Mobility   Transfers Overall transfer level: Needs assistance Equipment used: Rolling walker (2 wheeled) Transfers: Sit to/from [facility] Sit to Stand: Min assist Stand pivot transfers: Min assist       General transfer comment: Min assist to preform two stand pivots (one from [facility] to bed, and one from bed to recliner chair).  Pt verbalized being fearful of falling. Therapist  assisting with stability at trunk for balance during transitions and stabilized RW.  Verbal cues for safe hand placement.  Pt able to maintain NWB on left foot entire time and was able to pivot/small hops to turn from pivoting surfaces.          Balance Overall balance assessment: Needs assistance Sitting-balance support: Feet supported;No upper extremity supported Sitting balance-Leahy Scale: Good     Standing balance support: Bilateral upper extremity supported Standing balance-Leahy Scale: Poor Standing balance comment: needs RW and external assit for balance in standing.                     Cognition Arousal/Alertness: Awake/alert Behavior During Therapy: Anxious Overall Cognitive Status: Within Functional Limits for tasks assessed                      Exercises Total Joint Exercises Ankle Circles/Pumps: Other (comment) (toe wiggles bil x 10 reps (AAROM left)) Towel Squeeze: AROM;Both;10 reps;Seated Heel Slides: AROM;Both;10 reps;Seated Hip ABduction/ADduction: AROM;Both;10 reps;Supine Straight Leg Raises: AROM;Both;10 reps;Supine        Pertinent Vitals/Pain Pain Assessment: Faces Faces Pain Scale: Hurts whole lot (with mobility, none at rest per pt) Pain Location: left foot/ankle Pain Descriptors / Indicators: Aching;Burning Pain Intervention(s): Limited activity within patient's tolerance;Monitored during session;Repositioned;Other (comment) (pt was not sure she wanted pain meds since at rest it felt o)           PT Goals (current  goals can now be found in the care plan section) Acute Rehab PT Goals Patient Stated Goal: return to I  Progress towards PT goals: Progressing toward goals    Frequency  Min 5X/week    PT Plan Current plan remains appropriate;Frequency needs to be updated       End of Session Equipment Utilized During Treatment: Gait belt Activity Tolerance: Patient limited by pain Patient left: in chair;with call bell/phone  within reach     Time: 2103-1281 PT Time Calculation (min) (ACUTE ONLY): 16 min  Charges:  $Therapeutic Activity: 8-22 mins                     Raiza Kiesel B. [place], [place], DPT [phone]   06/15/2014, 3:01 PM

## 2014-06-15 NOTE — H&P (Signed)

## 2014-06-16 ENCOUNTER — Inpatient Hospital Stay (HOSPITAL_COMMUNITY): Payer: BC Managed Care – PPO

## 2014-06-16 ENCOUNTER — Inpatient Hospital Stay (HOSPITAL_COMMUNITY): Payer: BC Managed Care – PPO | Admitting: Certified Registered Nurse Anesthetist

## 2014-06-16 ENCOUNTER — Encounter (HOSPITAL_COMMUNITY): Payer: Self-pay | Admitting: Certified Registered Nurse Anesthetist

## 2014-06-16 ENCOUNTER — Encounter (HOSPITAL_COMMUNITY)
Admission: EM | Disposition: A | Discharge status: Home Health | Payer: Self-pay | Source: Home / Self Care | Attending: Orthopaedic Surgery

## 2014-06-16 HISTORY — PX: ORIF ANKLE FRACTURE: SHX5408

## 2014-06-16 LAB — SURGICAL PCR SCREEN
MRSA, PCR: NEGATIVE
Staphylococcus aureus: NEGATIVE

## 2014-06-16 LAB — CREATININE, SERUM: CREATININE: 0.57 mg/dL (ref 0.50–1.10)

## 2014-06-16 LAB — CBC
HCT: 38 % (ref 36.0–46.0)
Hemoglobin: 12.3 g/dL (ref 12.0–15.0)
MCH: 30.9 pg (ref 26.0–34.0)
MCHC: 32.4 g/dL (ref 30.0–36.0)
MCV: 95.5 fL (ref 78.0–100.0)
PLATELETS: 278 10*3/uL (ref 150–400)
RBC: 3.98 MIL/uL (ref 3.87–5.11)
RDW: 13 % (ref 11.5–15.5)
WBC: 9.3 10*3/uL (ref 4.0–10.5)

## 2014-06-16 SURGERY — OPEN REDUCTION INTERNAL FIXATION (ORIF) ANKLE FRACTURE
Anesthesia: General | Site: Ankle | Laterality: Left

## 2014-06-16 MED ORDER — PROPOFOL 10 MG/ML IV BOLUS
INTRAVENOUS | Status: DC | PRN
  Administered 2014-06-16: 100 mg via INTRAVENOUS
  Administered 2014-06-16: 200 mg via INTRAVENOUS

## 2014-06-16 MED ORDER — ACETAMINOPHEN 10 MG/ML IV SOLN
INTRAVENOUS | Status: DC | PRN
  Administered 2014-06-16: 1000 mg via INTRAVENOUS

## 2014-06-16 MED ORDER — ENOXAPARIN SODIUM 40 MG/0.4ML ~~LOC~~ SOLN
40.0000 mg | SUBCUTANEOUS | Status: DC
  Administered 2014-06-17 – 2014-06-18 (×2): 40 mg via SUBCUTANEOUS
  Filled 2014-06-16 (×3): qty 0.4

## 2014-06-16 MED ORDER — SUCCINYLCHOLINE CHLORIDE 20 MG/ML IJ SOLN
INTRAMUSCULAR | Status: AC
  Filled 2014-06-16: qty 1

## 2014-06-16 MED ORDER — NEOSTIGMINE METHYLSULFATE 10 MG/10ML IV SOLN
INTRAVENOUS | Status: DC | PRN
  Administered 2014-06-16: 4 mg via INTRAVENOUS

## 2014-06-16 MED ORDER — SENNA 8.6 MG PO TABS
1.0000 | ORAL_TABLET | Freq: Two times a day (BID) | ORAL | Status: DC
  Administered 2014-06-16 – 2014-06-18 (×5): 8.6 mg via ORAL
  Filled 2014-06-16 (×6): qty 1

## 2014-06-16 MED ORDER — PROPOFOL 10 MG/ML IV BOLUS
INTRAVENOUS | Status: AC
  Filled 2014-06-16: qty 20

## 2014-06-16 MED ORDER — ONDANSETRON HCL 4 MG/2ML IJ SOLN: 4.0000 mg | Freq: Four times a day (QID) | INTRAMUSCULAR | Status: DC | PRN

## 2014-06-16 MED ORDER — GLYCOPYRROLATE 0.2 MG/ML IJ SOLN
INTRAMUSCULAR | Status: DC | PRN
  Administered 2014-06-16: 0.6 mg via INTRAVENOUS

## 2014-06-16 MED ORDER — METOCLOPRAMIDE HCL 10 MG PO TABS: 5.0000 mg | ORAL_TABLET | Freq: Three times a day (TID) | ORAL | Status: DC | PRN

## 2014-06-16 MED ORDER — PHENYLEPHRINE HCL 10 MG/ML IJ SOLN
INTRAMUSCULAR | Status: DC | PRN
  Administered 2014-06-16: 80 ug via INTRAVENOUS

## 2014-06-16 MED ORDER — METHOCARBAMOL 1000 MG/10ML IJ SOLN
500.0000 mg | Freq: Four times a day (QID) | INTRAVENOUS | Status: DC | PRN
  Filled 2014-06-16 (×2): qty 5

## 2014-06-16 MED ORDER — ROCURONIUM BROMIDE 100 MG/10ML IV SOLN
INTRAVENOUS | Status: DC | PRN
  Administered 2014-06-16: 25 mg via INTRAVENOUS

## 2014-06-16 MED ORDER — MORPHINE SULFATE 2 MG/ML IJ SOLN
1.0000 mg | INTRAMUSCULAR | Status: DC | PRN
  Administered 2014-06-16 – 2014-06-17 (×4): 1 mg via INTRAVENOUS
  Filled 2014-06-16 (×4): qty 1

## 2014-06-16 MED ORDER — BUPIVACAINE HCL (PF) 0.25 % IJ SOLN
INTRAMUSCULAR | Status: AC
  Filled 2014-06-16: qty 30

## 2014-06-16 MED ORDER — SUCCINYLCHOLINE CHLORIDE 20 MG/ML IJ SOLN
INTRAMUSCULAR | Status: DC | PRN
  Administered 2014-06-16: 100 mg via INTRAVENOUS

## 2014-06-16 MED ORDER — HYDROCODONE-ACETAMINOPHEN 5-325 MG PO TABS
1.0000 | ORAL_TABLET | ORAL | Status: DC | PRN
  Administered 2014-06-16 – 2014-06-17 (×5): 2 via ORAL
  Administered 2014-06-18: 1 via ORAL
  Administered 2014-06-18 (×2): 2 via ORAL
  Administered 2014-06-18: 1 via ORAL
  Administered 2014-06-18: 2 via ORAL
  Filled 2014-06-16 (×4): qty 2
  Filled 2014-06-16: qty 1
  Filled 2014-06-16 (×6): qty 2

## 2014-06-16 MED ORDER — STERILE WATER FOR INJECTION IJ SOLN
INTRAMUSCULAR | Status: AC
  Filled 2014-06-16: qty 10

## 2014-06-16 MED ORDER — HYDROMORPHONE HCL 1 MG/ML IJ SOLN
0.2500 mg | INTRAMUSCULAR | Status: DC | PRN
  Administered 2014-06-16 (×3): 0.5 mg via INTRAVENOUS

## 2014-06-16 MED ORDER — ONDANSETRON HCL 4 MG/2ML IJ SOLN
INTRAMUSCULAR | Status: AC
  Filled 2014-06-16: qty 2

## 2014-06-16 MED ORDER — METOCLOPRAMIDE HCL 5 MG/ML IJ SOLN
5.0000 mg | Freq: Three times a day (TID) | INTRAMUSCULAR | Status: DC | PRN
Start: 2014-06-16 — End: 2014-06-18

## 2014-06-16 MED ORDER — HYDROMORPHONE HCL 1 MG/ML IJ SOLN
INTRAMUSCULAR | Status: AC
  Filled 2014-06-16: qty 1

## 2014-06-16 MED ORDER — EPHEDRINE SULFATE 50 MG/ML IJ SOLN
INTRAMUSCULAR | Status: AC
  Filled 2014-06-16: qty 1

## 2014-06-16 MED ORDER — DIPHENHYDRAMINE HCL 12.5 MG/5ML PO ELIX: 25.0000 mg | ORAL_SOLUTION | ORAL | Status: DC | PRN

## 2014-06-16 MED ORDER — METHOCARBAMOL 500 MG PO TABS
500.0000 mg | ORAL_TABLET | Freq: Four times a day (QID) | ORAL | Status: DC | PRN
  Administered 2014-06-16 – 2014-06-18 (×7): 500 mg via ORAL
  Filled 2014-06-16 (×7): qty 1

## 2014-06-16 MED ORDER — LIDOCAINE HCL (CARDIAC) 20 MG/ML IV SOLN
INTRAVENOUS | Status: AC
  Filled 2014-06-16: qty 5

## 2014-06-16 MED ORDER — OXYCODONE HCL 5 MG PO TABS: 5.0000 mg | ORAL_TABLET | ORAL | Status: DC | PRN

## 2014-06-16 MED ORDER — FENTANYL CITRATE 0.05 MG/ML IJ SOLN
INTRAMUSCULAR | Status: AC
  Filled 2014-06-16: qty 5

## 2014-06-16 MED ORDER — ONDANSETRON HCL 4 MG PO TABS: 4.0000 mg | ORAL_TABLET | Freq: Four times a day (QID) | ORAL | Status: DC | PRN

## 2014-06-16 MED ORDER — MAGNESIUM CITRATE PO SOLN: 1.0000 | Freq: Once | ORAL | Status: AC | PRN

## 2014-06-16 MED ORDER — OXYCODONE HCL 5 MG PO TABS
5.0000 mg | ORAL_TABLET | ORAL | Status: DC | PRN
  Filled 2014-06-16 (×2): qty 2
  Filled 2014-06-16: qty 3

## 2014-06-16 MED ORDER — SODIUM CHLORIDE 0.9 % IV SOLN
INTRAVENOUS | Status: DC
  Administered 2014-06-16: 21:00:00 via INTRAVENOUS

## 2014-06-16 MED ORDER — ONDANSETRON HCL 4 MG/2ML IJ SOLN
INTRAMUSCULAR | Status: DC | PRN
  Administered 2014-06-16: 4 mg via INTRAVENOUS

## 2014-06-16 MED ORDER — LACTATED RINGERS IV SOLN
INTRAVENOUS | Status: DC | PRN
  Administered 2014-06-16 (×2): via INTRAVENOUS

## 2014-06-16 MED ORDER — SORBITOL 70 % SOLN: 30.0000 mL | Freq: Every day | Status: DC | PRN

## 2014-06-16 MED ORDER — ENOXAPARIN SODIUM 40 MG/0.4ML ~~LOC~~ SOLN: 40.0000 mg | Freq: Every day | SUBCUTANEOUS | Status: DC

## 2014-06-16 MED ORDER — ONDANSETRON HCL 4 MG/2ML IJ SOLN: 4.0000 mg | Freq: Once | INTRAMUSCULAR | Status: DC | PRN

## 2014-06-16 MED ORDER — 0.9 % SODIUM CHLORIDE (POUR BTL) OPTIME
TOPICAL | Status: DC | PRN
  Administered 2014-06-16: 1000 mL

## 2014-06-16 MED ORDER — KETOROLAC TROMETHAMINE 30 MG/ML IJ SOLN
30.0000 mg | Freq: Four times a day (QID) | INTRAMUSCULAR | Status: AC | PRN
  Administered 2014-06-16 – 2014-06-17 (×3): 30 mg via INTRAVENOUS
  Filled 2014-06-16 (×5): qty 1

## 2014-06-16 MED ORDER — FENTANYL CITRATE 0.05 MG/ML IJ SOLN
INTRAMUSCULAR | Status: DC | PRN
  Administered 2014-06-16: 150 ug via INTRAVENOUS
  Administered 2014-06-16: 75 ug via INTRAVENOUS
  Administered 2014-06-16: 25 ug via INTRAVENOUS

## 2014-06-16 MED ORDER — GLYCOPYRROLATE 0.2 MG/ML IJ SOLN
INTRAMUSCULAR | Status: AC
  Filled 2014-06-16: qty 3

## 2014-06-16 MED ORDER — POLYETHYLENE GLYCOL 3350 17 G PO PACK: 17.0000 g | PACK | Freq: Every day | ORAL | Status: DC | PRN

## 2014-06-16 MED ORDER — ROCURONIUM BROMIDE 50 MG/5ML IV SOLN
INTRAVENOUS | Status: AC
  Filled 2014-06-16: qty 1

## 2014-06-16 MED ORDER — DEXAMETHASONE SODIUM PHOSPHATE 4 MG/ML IJ SOLN
INTRAMUSCULAR | Status: DC | PRN
  Administered 2014-06-16: 8 mg via INTRAVENOUS

## 2014-06-16 MED ORDER — DEXAMETHASONE SODIUM PHOSPHATE 4 MG/ML IJ SOLN
INTRAMUSCULAR | Status: AC
  Filled 2014-06-16: qty 2

## 2014-06-16 MED ORDER — CEFAZOLIN SODIUM-DEXTROSE 2-3 GM-% IV SOLR
2.0000 g | Freq: Four times a day (QID) | INTRAVENOUS | Status: AC
  Administered 2014-06-16 – 2014-06-17 (×3): 2 g via INTRAVENOUS
  Filled 2014-06-16 (×3): qty 50

## 2014-06-16 MED ORDER — MIDAZOLAM HCL 2 MG/2ML IJ SOLN
INTRAMUSCULAR | Status: AC
  Filled 2014-06-16: qty 2

## 2014-06-16 MED ORDER — ACETAMINOPHEN 10 MG/ML IV SOLN
INTRAVENOUS | Status: AC
  Filled 2014-06-16: qty 100

## 2014-06-16 MED ORDER — LIDOCAINE HCL (CARDIAC) 20 MG/ML IV SOLN
INTRAVENOUS | Status: DC | PRN
  Administered 2014-06-16: 80 mg via INTRAVENOUS

## 2014-06-16 SURGICAL SUPPLY — 68 items
BANDAGE ELASTIC 4 VELCRO ST LF (GAUZE/BANDAGES/DRESSINGS) IMPLANT
BANDAGE ELASTIC 6 VELCRO ST LF (GAUZE/BANDAGES/DRESSINGS) ×2 IMPLANT
BIT DRILL 3.5 QC 155 (BIT) ×1 IMPLANT
BIT DRILL QC 2.7 6.3IN  SHORT (BIT) ×1
BIT DRILL QC 2.7 6.3IN SHORT (BIT) IMPLANT
BNDG COHESIVE 4X5 TAN STRL (GAUZE/BANDAGES/DRESSINGS) ×2 IMPLANT
BNDG COHESIVE 6X5 TAN STRL LF (GAUZE/BANDAGES/DRESSINGS) ×2 IMPLANT
COVER SURGICAL LIGHT HANDLE (MISCELLANEOUS) ×2 IMPLANT
CUFF TOURNIQUET SINGLE 34IN LL (TOURNIQUET CUFF) ×1 IMPLANT
CUFF TOURNIQUET SINGLE 44IN (TOURNIQUET CUFF) IMPLANT
DRAPE C-ARM 42X72 X-RAY (DRAPES) ×2 IMPLANT
DRAPE C-ARMOR (DRAPES) ×2 IMPLANT
DRAPE IMP U-DRAPE 54X76 (DRAPES) ×2 IMPLANT
DRAPE INCISE IOBAN 66X45 STRL (DRAPES) ×2 IMPLANT
DRAPE U-SHAPE 47X51 STRL (DRAPES) ×4 IMPLANT
DURAPREP 26ML APPLICATOR (WOUND CARE) ×3 IMPLANT
ELECT CAUTERY BLADE 6.4 (BLADE) ×2 IMPLANT
ELECT REM PT RETURN 9FT ADLT (ELECTROSURGICAL) ×2
ELECTRODE REM PT RTRN 9FT ADLT (ELECTROSURGICAL) ×1 IMPLANT
FACESHIELD WRAPAROUND (MASK) ×2 IMPLANT
FACESHIELD WRAPAROUND OR TEAM (MASK) ×1 IMPLANT
GAUZE SPONGE 4X4 12PLY STRL (GAUZE/BANDAGES/DRESSINGS) ×2 IMPLANT
GAUZE XEROFORM 5X9 LF (GAUZE/BANDAGES/DRESSINGS) ×2 IMPLANT
GLOVE BIOGEL PI IND STRL 7.0 (GLOVE) IMPLANT
GLOVE BIOGEL PI INDICATOR 7.0 (GLOVE) ×1
GLOVE ECLIPSE 6.5 STRL STRAW (GLOVE) ×1 IMPLANT
GLOVE SURG SYN 7.5  E (GLOVE) ×2
GLOVE SURG SYN 7.5 E (GLOVE) ×2 IMPLANT
GLOVE SURG SYN 7.5 PF PI (GLOVE) ×2 IMPLANT
GOWN STRL REIN XL XLG (GOWN DISPOSABLE) ×6 IMPLANT
K-WIRE 1.6 (WIRE) ×2
K-WIRE FX150X1.6XTROC PNT (WIRE) ×1
KIT BASIN OR (CUSTOM PROCEDURE TRAY) ×2 IMPLANT
KIT ROOM TURNOVER OR (KITS) ×2 IMPLANT
KWIRE FX150X1.6XTROC PNT (WIRE) IMPLANT
NDL HYPO 25GX1X1/2 BEV (NEEDLE) IMPLANT
NEEDLE HYPO 25GX1X1/2 BEV (NEEDLE) IMPLANT
NS IRRIG 1000ML POUR BTL (IV SOLUTION) ×2 IMPLANT
PACK ORTHO EXTREMITY (CUSTOM PROCEDURE TRAY) ×2 IMPLANT
PAD ARMBOARD 7.5X6 YLW CONV (MISCELLANEOUS) ×4 IMPLANT
PAD CAST 3X4 CTTN HI CHSV (CAST SUPPLIES) ×2 IMPLANT
PADDING CAST COTTON 3X4 STRL (CAST SUPPLIES) ×4
PADDING CAST COTTON 6X4 STRL (CAST SUPPLIES) ×2 IMPLANT
PLATE FIBULA 5 HOLE (Plate) ×1 IMPLANT
SCREW 3.5X32MM (Screw) ×1 IMPLANT
SCREW 5.0X10 (Screw) ×1 IMPLANT
SCREW CANC 5.0X14 (Screw) ×1 IMPLANT
SCREW CORTICAL 3.5X34MM (Screw) IMPLANT
SCREW LOCK 10X3.5XST NS (Screw) IMPLANT
SCREW LOCK 3.5X10 (Screw) ×2 IMPLANT
SCREW LOCK 3.5X14 (Screw) ×2 IMPLANT
SCREW NON LOCK 3.5X12 (Screw) IMPLANT
SCREW NONLOCK 3.5X10 (Screw) ×3 IMPLANT
SCREW NONLOCK 3.5X26 (Screw) ×1 IMPLANT
SPLINT FIBERGLASS 4X30 (CAST SUPPLIES) ×1 IMPLANT
SPONGE LAP 18X18 X RAY DECT (DISPOSABLE) ×4 IMPLANT
SUCTION FRAZIER TIP 10 FR DISP (SUCTIONS) ×2 IMPLANT
SUT ETHILON 2 0 FS 18 (SUTURE) IMPLANT
SUT ETHILON 3 0 PS 1 (SUTURE) IMPLANT
SUT VIC AB 0 CT1 27 (SUTURE)
SUT VIC AB 0 CT1 27XBRD ANBCTR (SUTURE) IMPLANT
SUT VIC AB 2-0 CT1 27 (SUTURE)
SUT VIC AB 2-0 CT1 TAPERPNT 27 (SUTURE) IMPLANT
SYR CONTROL 10ML LL (SYRINGE) IMPLANT
TOWEL OR 17X24 6PK STRL BLUE (TOWEL DISPOSABLE) ×2 IMPLANT
TOWEL OR 17X26 10 PK STRL BLUE (TOWEL DISPOSABLE) ×4 IMPLANT
TUBE CONNECTING 12X1/4 (SUCTIONS) ×2 IMPLANT
WATER STERILE IRR 1000ML POUR (IV SOLUTION) ×1 IMPLANT

## 2014-06-16 NOTE — Progress Notes (Signed)
   Subjective:  Patient reports pain as moderate.  Objective:   VITALS:   Filed Vitals:   06/16/14 1054 06/16/14 1100 06/16/14 1123 06/16/14 1142  BP: 105/56   110/62  Pulse: 59 62  59  Temp:   98 F (36.7 C) 98 F (36.7 C)  TempSrc:    Oral  Resp: 9 20  16   Height:      Weight:      SpO2: 97% 99%  98%    Neurologically intact Neurovascular intact Sensation intact distally Intact pulses distally Dorsiflexion/Plantar flexion intact Incision: dressing C/D/I and no drainage No cellulitis present Compartment soft   Lab Results  Component Value Date   WBC 7.8 06/13/2014   HGB 12.0 06/13/2014   HCT 37.4 06/13/2014   MCV 97.9 06/13/2014   PLT 238 06/13/2014     Assessment/Plan:  Day of Surgery   - Up with PT/OT - DVT ppx - SCDs, ambulation, lovenox - NWB left lower extremity - WBAT RLE in fx boot - Pain control - Discharge planning - patient may dc after PT and pain controlled - anticipate Thur or Friday - Rx in chart  Marianna Payment 06/16/2014, 4:10 PM [phone]

## 2014-06-16 NOTE — Transfer of Care (Signed)
Immediate Anesthesia Transfer of Care Note  Patient: Samantha Smith  Procedure(s) Performed: Procedure(s): OPEN REDUCTION INTERNAL FIXATION (ORIF) LEFT ANKLE, LISFRANC FRACTURE (Left)  Patient Location: PACU  Anesthesia Type:General  Level of Consciousness: awake and alert   Airway & Oxygen Therapy: Patient Spontanous Breathing and Patient connected to nasal cannula oxygen  Post-op Assessment: Report given to PACU RN, Post -op Vital signs reviewed and stable and Patient moving all extremities X 4  Post vital signs: Reviewed and stable  Complications: No apparent anesthesia complications

## 2014-06-16 NOTE — Discharge Instructions (Signed)
1. Take lovenox to prevent blood clots 2. Take pain meds as needed 3. Take stool softeners as needed 4. Keep splint clean and dry

## 2014-06-16 NOTE — Anesthesia Preprocedure Evaluation (Addendum)
Anesthesia Evaluation  Patient identified by MRN, date of birth, ID band Patient awake    Reviewed: Allergy & Precautions, H&P , NPO status , Patient's Chart, lab work & pertinent test results  Airway Mallampati: II  TM Distance: >3 FB Neck ROM: Full    Dental  (+) Dental Advisory Given, Teeth Intact   Pulmonary          Cardiovascular     Neuro/Psych    GI/Hepatic   Endo/Other  Hypothyroidism Morbid obesity  Renal/GU      Musculoskeletal   Abdominal   Peds  Hematology   Anesthesia Other Findings   Reproductive/Obstetrics                            Anesthesia Physical Anesthesia Plan  ASA: II  Anesthesia Plan: General   Post-op Pain Management:    Induction: Intravenous  Airway Management Planned: Oral ETT  Additional Equipment:   Intra-op Plan:   Post-operative Plan: Extubation in OR  Informed Consent: I have reviewed the patients History and Physical, chart, labs and discussed the procedure including the risks, benefits and alternatives for the proposed anesthesia with the patient or authorized representative who has indicated his/her understanding and acceptance.   Dental advisory given  Plan Discussed with: Anesthesiologist, CRNA and Surgeon  Anesthesia Plan Comments:         Anesthesia Quick Evaluation

## 2014-06-16 NOTE — Anesthesia Procedure Notes (Signed)
Procedure Name: Intubation Date/Time: 06/16/2014 8:32 AM Performed by: Garrison Columbus T Pre-anesthesia Checklist: Patient identified, Emergency Drugs available, Suction available and Patient being monitored Patient Re-evaluated:Patient Re-evaluated prior to inductionOxygen Delivery Method: Circle system utilized Preoxygenation: Pre-oxygenation with 100% oxygen Intubation Type: IV induction Ventilation: Mask ventilation without difficulty and Oral airway inserted - appropriate to patient size Laryngoscope Size: Sabra Heck and 2 Grade View: Grade I Tube type: Oral Tube size: 7.5 mm Number of attempts: 1 Airway Equipment and Method: Stylet,  LTA kit utilized and Oral airway Placement Confirmation: ETT inserted through vocal cords under direct vision,  positive ETCO2 and breath sounds checked- equal and bilateral Secured at: 21 cm Tube secured with: Tape Dental Injury: Teeth and Oropharynx as per pre-operative assessment

## 2014-06-16 NOTE — Anesthesia Postprocedure Evaluation (Signed)
Anesthesia Post Note  Patient: Samantha Smith  Procedure(s) Performed: Procedure(s) (LRB): OPEN REDUCTION INTERNAL FIXATION (ORIF) LEFT ANKLE, LISFRANC FRACTURE (Left)  Anesthesia type: general  Patient location: PACU  Post pain: Pain level controlled  Post assessment: Patient's Cardiovascular Status Stable  Last Vitals:  Filed Vitals:   06/16/14 1123  BP:   Pulse:   Temp: 36.7 C  Resp:     Post vital signs: Reviewed and stable  Level of consciousness: sedated  Complications: No apparent anesthesia complications

## 2014-06-16 NOTE — Progress Notes (Signed)
Orthopedic Tech Progress Note Patient Details:  Samantha Smith 1970/08/03 [id number]  Ortho Devices Type of Ortho Device: CAM walker, Jari Pigg splint, Crutches Ortho Device/Splint Location: trapeze bar patient helper Ortho Device/Splint Interventions: Application   Hildred Priest 06/16/2014, 3:36 PM

## 2014-06-16 NOTE — Care Management Note (Signed)
CARE MANAGEMENT NOTE 06/16/2014  Patient:  Samantha Smith, Samantha Smith   Account Number:  [id number]  Date Initiated:  06/13/2014  Documentation initiated by:  [facility]  Subjective/Objective Assessment:   Right distal fibula avulsion fx, Left lateral malleolus fracture, Left bony lisfranc fx  06/16/14 patient had ORIF left ankle Lisfranc fracture.     Action/Plan:   PT/OT eval pending   Anticipated DC Date:     Anticipated DC Plan:  [place]  CM consult      Choice offered to / List presented to:             Status of service:  In process, will continue to follow

## 2014-06-16 NOTE — Op Note (Signed)
Date of Surgery: 06/16/2014  INDICATIONS: [name] is a 43 y.o.-year-old female who sustained a left ankle fracture and left lisfranc fracture; she was indicated for open reduction and internal fixation due to the displaced nature of the articular fracture and came to the operating room today for this procedure. The patient did consent to the procedure after discussion of the risks and benefits.  PREOPERATIVE DIAGNOSIS:  1. left lateral malleolus ankle fracture 2. Left lisfranc fracture   POSTOPERATIVE DIAGNOSIS: Same.  PROCEDURE:  1. Open treatment of left ankle fracture with internal fixation. 2. Open treatment of left lisfranc fracture, 2nd metatarsal to medial cuneiform 3. Open treatment of left lisfranc fracture, 1st metatarsal to medial cuneiform  SURGEON: N. Eduard Roux, M.D.  ASSIST: none.  ANESTHESIA:  general  IV FLUIDS AND URINE: See anesthesia.  ESTIMATED BLOOD LOSS: minimal mL.  IMPLANTS: [employer] VLP  COMPLICATIONS: None.  DESCRIPTION OF PROCEDURE: The patient was brought to the operating room and placed supine on the operating table.  The patient had been signed prior to the procedure and this was documented. The patient had the anesthesia placed by the anesthesiologist.  A nonsterile tourniquet was placed on the upper thigh.  The prep verification and incision time-outs were performed to confirm that this was the correct patient, site, side and location. The patient had an SCD on the opposite lower extremity. The patient did receive antibiotics prior to the incision and was re-dosed during the procedure as needed at indicated intervals.  The patient had the lower extremity prepped and draped in the standard surgical fashion.  The extremity was exsanguinated using an esmarch bandage and the tourniquet was inflated to 300 mm Hg.  We first began with the ankle fixation. The bony landmarks were palpated and a lateral incision over the distal fibula was used.  Full-thickness flaps were created. The superficial peroneal nerve was not encountered. The lateral aspect of the fibula was exposed by subperiosteal elevation. The fracture was exposed. The fracture was reduced. A precontoured distal fibular plate was placed on the lateral aspect of the fibula. A series of locking and nonlocking screws were placed distal and proximal to the fracture in a bridge fashion. Fluoroscopy was used to confirm adequate placement. Once this was done we then turned our attention to the Lisfranc portion of the procedure. Using fluoroscopy the incision over the first and second intermetatarsal space was determined. Blunt dissection was taken down to the level of the EHL tendon. The EHL tendon was identified and retracted medially. The neurovascular structure was also identified and retracted and protected laterally. Full-thickness flaps were created. The periosteum was elevated off of the medial cuneiform, first metatarsal and second metatarsal.  There was gross motion of the Lisfranc joint between the first metatarsal and medial cuneiform and second metatarsal and the medial cuneiform.  A pointed tenaculum was used to reduce the medial cuneiform to the base of the second metatarsal. With the joint reduced a provisional K wire were placed down the first metatarsal and into the medial cuneiform. We then placed a 3.5 mm cortical screw from the medial cuneiform into the base of second metatarsal. Its position was verified under fluoroscopy.  We then released the clamp and the joint stayed reduced. We then removed the K wire and placed a retrograde 3.5 mm screw from the base of the first metatarsal to the medial cuneiform. Fluoroscopy was also taken to verify adequate placement. Final x-rays were taken. The wounds were thoroughly  irrigated. The tourniquet was inflated and hemostasis was obtained. They were closed in layer fashion using 2-0 Vicryl for the deep skin layer and 3-0 nylon for the skin.  Sterile dressings were applied the lower extremity was immobilized in a short-leg splint. Was extubated and transferred to the PACU in stable condition.  POSTOPERATIVE PLAN: [name] will remain nonweightbearing on this leg for approximately 6 weeks; [name] will return for suture removal in 2 weeks.  He will be immobilized in a short leg splint and then transitioned to a short leg cast at his first follow up appointment.  [name] will receive DVT prophylaxis based on other medications, activity level, and risk ratio of bleeding to thrombosis.  Azucena Cecil, MD [place] 10:14 AM

## 2014-06-17 NOTE — Progress Notes (Signed)
Patient ID: Samantha Smith, female   DOB: July 07, 1971, 43 y.o.   MRN: [id number] Subjective: 1 Day Post-Op Procedure(s) (LRB): OPEN REDUCTION INTERNAL FIXATION (ORIF) LEFT ANKLE, LISFRANC FRACTURE (Left)    Objective:   VITALS:  Temp:  [98.1 F (36.7 C)-98.5 F (36.9 C)] 98.4 F (36.9 C) (11/26 1500) Pulse Rate:  [62-71] 62 (11/26 1500) BP: (94-99)/(50-78) 99/78 mmHg (11/26 1500) SpO2:  [95 %-98 %] 98 % (11/26 1500)   LABS  Recent Labs  06/16/14 1518  HGB 12.3  WBC 9.3  PLT 278    Recent Labs  06/16/14 1518  CREATININE 0.57   No results for input(s): LABPT, INR in the last 72 hours.   Assessment/Plan: 1 Day Post-Op Procedure(s) (LRB): OPEN REDUCTION INTERNAL FIXATION (ORIF) LEFT ANKLE, LISFRANC FRACTURE (Left)  Patient unable to tolerate oxycodone. Hospital bed is not available today, [facility] not able to obtain delivery. Will hopefully have bed delivered tomorrow. Plan to keep her tonight.  Karl Knarr E 06/17/2014, 7:00 PM

## 2014-06-17 NOTE — Plan of Care (Signed)
Problem: Phase I Progression Outcomes Goal: Pain controlled with appropriate interventions Outcome: Completed/Met Date Met:  06/17/14 Goal: Incision/dressings dry and intact Outcome: Completed/Met Date Met:  06/17/14 Goal: Tubes/drains patent Outcome: Not Applicable Date Met:  33/43/56 Goal: Voiding-avoid urinary catheter unless indicated Outcome: Completed/Met Date Met:  06/17/14 Goal: Vital signs/hemodynamically stable Outcome: Completed/Met Date Met:  06/17/14  Problem: Phase II Progression Outcomes Goal: Progressing with IS, TCDB Outcome: Completed/Met Date Met:  06/17/14

## 2014-06-17 NOTE — Progress Notes (Signed)
CARE MANAGEMENT NOTE 06/17/2014  Patient:  Samantha Smith, Samantha Smith   Account Number:  [id number]  Date Initiated:  06/13/2014  Documentation initiated by:  [facility]  Subjective/Objective Assessment:   Right distal fibula avulsion fx, Left lateral malleolus fracture, Left bony lisfranc fx  06/16/14 patient had ORIF left ankle Lisfranc fracture.     Action/Plan:   PT/OT eval pending   Anticipated DC Date:  06/17/2014   Anticipated DC Plan:  [place]  CM consult      Choice offered to / List presented to:     DME arranged  HOSPITAL BED      DME agency  [place].        Status of service:  Completed, signed off Medicare Important Message given?   (If response is "NO", the following Medicare IM given date fields will be blank) Date Medicare IM given:   Medicare IM given by:   Date Additional Medicare IM given:   Additional Medicare IM given by:    Discharge Disposition:  HOME/SELF CARE  Per UR Regulation:    If discussed at Long Length of Stay Meetings, dates discussed:    Comments:  06/17/2014 1400 NCM spoke to husband. Explained [facility] will contact with delivery time for hospital bed. Jonnie Finner RN CCM Case Mgmt phone [phone]

## 2014-06-17 NOTE — Evaluation (Signed)
Occupational Therapy Evaluation and Discharge Patient Details Name: Samantha Smith MRN: [id number] DOB: 1971-05-13 Today's Date: 06/17/2014    History of Present Illness 43 yo female adm after mechanical fall resulting in bil ankle injuries;  xray R ankle + fibula avulsion fx, cam boot, no sugery recommended ; L ankle lisfranc fx -- surgery 06/16/2014  PMHx: lap band in 2008, brain tumor excision   Clinical Impression   This 43 yo female admitted and underwent above presents to acute OT with all education completed with pt and family. Pt will need a hospital bed at home due to no bedroom on first level of home and pt cannot negotiate a flight of steps with NWB'ing LLE. I gave family handout and explained to them about bumping pt up in the W/C the three steps she has to get into the house.    Follow Up Recommendations  No OT follow up    Equipment Recommendations  Hospital bed       Precautions / Restrictions Precautions Precautions: Fall Required Braces or Orthoses: Other Brace/Splint Other Brace/Splint: [place] on RLE Restrictions Weight Bearing Restrictions: Yes RLE Weight Bearing: Weight bearing as tolerated LLE Weight Bearing: Non weight bearing      Mobility Bed Mobility Overal bed mobility: Modified Independent Bed Mobility: Supine to Sit;Sit to Supine     Supine to sit: Modified independent (Device/Increase time);HOB elevated (and use of rail) Sit to supine:  (used a towel under her thigh to help raise her LLE up onto the bed and bed rail)      Transfers Overall transfer level: Needs assistance Equipment used: Rolling walker (2 wheeled) Transfers: Sit to/from [facility] Sit to Stand: Min guard Stand pivot transfers: Min guard            Balance Overall balance assessment: Needs assistance Sitting-balance support: Feet supported;No upper extremity supported Sitting balance-Leahy Scale: Good     Standing balance support: Bilateral upper  extremity supported Standing balance-Leahy Scale: Fair                              ADL Overall ADL's : Needs assistance/impaired                         Toilet Transfer: Min guard;Stand-pivot;RW;BSC   Toileting- Water quality scientist and Hygiene: Min guard;Sit to/from stand         General ADL Comments: We talked about maybe going to someone else's house to shower where bathroom was on first floor or she came up with using a kids swimming pool in kitchen where she could sit on 3n1 in pool and her family could use the hand held faucet from the sink. She was made aware of keeping the dressing clean and dry on the LLE--made them aware of where they could get a cast cover. We talked about using athletic pants that zipped,button, or snapped at the bottom that would make it easier for her to get pants on and off over cast and cam boot.                Pertinent Vitals/Pain Pain Assessment: Faces Faces Pain Scale: Hurts little more Pain Location: LLE when she put it in a dependent position Pain Descriptors / Indicators: Throbbing Pain Intervention(s): Limited activity within patient's tolerance;Monitored during session;Repositioned     Hand Dominance Right   Extremity/Trunk Assessment Upper Extremity Assessment Upper Extremity Assessment: Overall [facility]  for tasks assessed           Communication Communication Communication: No difficulties   Cognition Arousal/Alertness: Awake/alert Behavior During Therapy: WFL for tasks assessed/performed Overall Cognitive Status: Within Functional Limits for tasks assessed                                Home Living Family/patient expects to be discharged to:: Private residence Living Arrangements: Spouse/significant other Available Help at Discharge: Family;Available 24 hours/day Type of Home: House Home Access: Stairs to enter [employer] of Steps: 3 Entrance Stairs-Rails: Right;Left Home  Layout: Two level;Bed/bath upstairs Alternate Level Stairs-Number of Steps: 10--landing--10--landing--3   Bathroom Shower/Tub: Walk-in shower;Tub/shower unit (both on 3rd level)   Bathroom Toilet: Standard     Home Equipment: Walker - 2 wheels;Crutches;Bedside commode;Wheelchair - manual   Additional Comments: has access to knee scooter that was her mother's      Prior Functioning/Environment Level of Independence: Independent             OT Diagnosis: Generalized weakness         OT Goals(Current goals can be found in the care plan section) Acute Rehab OT Goals Patient Stated Goal: I did not ask  OT Frequency:                End of Session Equipment Utilized During Treatment: Rolling walker Nurse Communication:  (Pt is OK to go home with family there)  Activity Tolerance: Patient tolerated treatment well Patient left: in bed;with call bell/phone within reach   Time: 1117-1145 OT Time Calculation (min): 28 min Charges:  OT General Charges $OT Visit: 1 Procedure OT Evaluation $Initial OT Evaluation Tier I: 1 Procedure OT Treatments $Self Care/Home Management : 23-37 mins  Almon Register [phone] 06/17/2014, 2:13 PM

## 2014-06-17 NOTE — Discharge Summary (Addendum)
Physician Discharge Summary  Patient ID: Samantha Smith MRN: [id number] DOB/AGE: 28-Feb-1971 43 y.o.  Admit date: 06/12/2014 Discharge date:   Admission Diagnoses:  Principal Problem:   Lisfranc's sprain Active Problems:   Thyroid disease   Bilateral ankle fractures   Transient hypotension   Discharge Diagnoses:  Same  Past Medical History  Diagnosis Date  . Positive PPD     s/p abx per patient  . Thyroid disease   . STD (sexually transmitted disease)     Chlamydia history  . Blurred vision   . Morbid obesity   . ASCUS (atypical squamous cells of undetermined significance) on Pap smear 12/2012  . 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 Cervical cancer 02/2013    Stage IB1 poorly differentiated squamous cell    Surgeries: Procedure(s): OPEN REDUCTION INTERNAL FIXATION (ORIF) LEFT ANKLE, LISFRANC FRACTURE on 06/12/2014 - 06/16/2014   Consultants:    Discharged Condition: Improved  Hospital Course: Samantha Smith is an 43 y.o. female who was admitted 06/12/2014 with a chief complaint of  Chief Complaint  Patient presents with  . Ankle Pain    bil  , and found to have a diagnosis of Lisfranc's sprain.  They were brought to the operating room on 06/12/2014 - 06/16/2014 and underwent the above named procedures.    They were given perioperative antibiotics:  Anti-infectives    Start     Dose/Rate Route Frequency Ordered Stop   06/16/14 1600  ceFAZolin (ANCEF) IVPB 2 g/50 mL premix     2 g100 mL/hr over 30 Minutes Intravenous Every 6 hours 06/16/14 1220 06/17/14 0340   06/16/14 0600  ceFAZolin (ANCEF) 3 g in dextrose 5 % 50 mL IVPB     3 g160 mL/hr over 30 Minutes Intravenous On call to O.R. 06/15/14 1333 06/16/14 0835    .  They were given sequential compression devices, early ambulation, and chemoprophylaxis for DVT prophylaxis. Patient underwent ORIF of the left ankle fracture and left foot Lis Franc injury. Post operatively allowed to full  Weight bear on the right  lower extremity. She tolerated po meds and nourishment. Medicated with hydrodocone For pain as she has significant side effects with oxycodone. Discharged home post PT/OT on 06/17/2014/ They benefited maximally from their hospital stay and there were no complications.Hospital bed requested and ordered based on need to provide elevation of both legs, to assist with transfers, due to patient obesity and home With 2 stories no bed on lower level to prevent attempts at stair climbing rental for 3 months.    Recent vital signs:  Filed Vitals:   06/17/14 0552  BP: 98/78  Pulse:   Temp:   Resp:     Recent laboratory studies:  Results for orders placed or performed during the hospital encounter of 06/12/14  Surgical pcr screen  Result Value Ref Range   MRSA, PCR NEGATIVE NEGATIVE   Staphylococcus aureus NEGATIVE NEGATIVE  CBC  Result Value Ref Range   WBC 9.8 4.0 - 10.5 K/uL   RBC 4.41 3.87 - 5.11 MIL/uL   Hemoglobin 13.9 12.0 - 15.0 g/dL   HCT 42.6 36.0 - 46.0 %   MCV 96.6 78.0 - 100.0 fL   MCH 31.5 26.0 - 34.0 pg   MCHC 32.6 30.0 - 36.0 g/dL   RDW 13.1 11.5 - 15.5 %   Platelets 277 150 - 400 K/uL  Basic metabolic panel  Result Value Ref Range   Sodium 136 (L) 137 - 147 mEq/L  Potassium 4.1 3.7 - 5.3 mEq/L   Chloride 101 96 - 112 mEq/L   CO2 22 19 - 32 mEq/L   Glucose, Bld 103 (H) 70 - 99 mg/dL   BUN 11 6 - 23 mg/dL   Creatinine, Ser 0.68 0.50 - 1.10 mg/dL   Calcium 8.8 8.4 - 10.5 mg/dL   GFR calc non Af Amer >90 >90 mL/min   GFR calc Af Amer >90 >90 mL/min   Anion gap 13 5 - 15  TSH  Result Value Ref Range   TSH 5.680 (H) 0.350 - 4.500 uIU/mL  CBC  Result Value Ref Range   WBC 7.8 4.0 - 10.5 K/uL   RBC 3.82 (L) 3.87 - 5.11 MIL/uL   Hemoglobin 12.0 12.0 - 15.0 g/dL   HCT 37.4 36.0 - 46.0 %   MCV 97.9 78.0 - 100.0 fL   MCH 31.4 26.0 - 34.0 pg   MCHC 32.1 30.0 - 36.0 g/dL   RDW 13.2 11.5 - 15.5 %   Platelets 238 150 - 400 K/uL  CBC  Result Value Ref Range   WBC  9.3 4.0 - 10.5 K/uL   RBC 3.98 3.87 - 5.11 MIL/uL   Hemoglobin 12.3 12.0 - 15.0 g/dL   HCT 38.0 36.0 - 46.0 %   MCV 95.5 78.0 - 100.0 fL   MCH 30.9 26.0 - 34.0 pg   MCHC 32.4 30.0 - 36.0 g/dL   RDW 13.0 11.5 - 15.5 %   Platelets 278 150 - 400 K/uL  Creatinine, serum  Result Value Ref Range   Creatinine, Ser 0.57 0.50 - 1.10 mg/dL   GFR calc non Af Amer >90 >90 mL/min   GFR calc Af Amer >90 >90 mL/min    Discharge Medications:     Medication List    TAKE these medications        enoxaparin 40 MG/0.4ML injection  Commonly known as:  LOVENOX  Inject 0.4 mLs (40 mg total) into the skin daily.     levothyroxine 50 MCG tablet  Commonly known as:  SYNTHROID, LEVOTHROID  Take 1 tablet (50 mcg total) by mouth daily.     multivitamin capsule  Take 1 capsule by mouth daily.     ondansetron 4 MG disintegrating tablet  Commonly known as:  ZOFRAN ODT  Take 1 tablet (4 mg total) by mouth every 8 (eight) hours as needed for nausea or vomiting.     oxyCODONE 5 MG immediate release tablet  Commonly known as:  Oxy IR/ROXICODONE  Take 1-3 tablets (5-15 mg total) by mouth every 4 (four) hours as needed.     oxyCODONE-acetaminophen 5-325 MG per tablet  Commonly known as:  PERCOCET/ROXICET  Take 2 tablets by mouth every 4 (four) hours as needed for moderate pain or severe pain.        Diagnostic Studies: Dg Ankle Complete Left  18-Jun-2014   CLINICAL DATA:  Open reduction internal fixation left fibular fracture repair  EXAM: DG C-ARM 61-120 MIN; LEFT ANKLE COMPLETE - 3+ VIEW  TECHNIQUE: Three intraoperative views of the left ankle submitted.  CONTRAST:  None  FLUOROSCOPY TIME:  31 seconds  COMPARISON:  06/12/2014  FINDINGS: Three views submitted for interpretation. The patient is status post distal left fibular fracture repair. There is a metallic fixation plate and metallic fixation screws are noted in distal left fibula. There is anatomic alignment. Ankle mortise is preserved.   IMPRESSION: Metallic fixation plate and screws in distal left fibula anatomic alignment.  Electronically Signed   By: Lahoma Crocker M.D.   On: 06/16/2014 10:19   Dg Ankle Complete Left  06/12/2014   CLINICAL DATA:  Fall with left ankle injury, pain and swelling.  EXAM: LEFT ANKLE COMPLETE - 3+ VIEW  COMPARISON:  None.  FINDINGS: A nondisplaced oblique fracture of the distal fibula is noted. Overlying soft tissue swelling is noted.  There is no evidence of subluxation or dislocation.  The talus is unremarkable.  Chronic changes along the medial ankle are present.  IMPRESSION: Oblique distal fibular fracture.   Electronically Signed   By: Hassan Rowan M.D.   On: 06/12/2014 18:17   Dg Ankle Complete Right  06/12/2014   CLINICAL DATA:  Fall with right ankle injury and pain.  EXAM: RIGHT ANKLE - COMPLETE 3+ VIEW  COMPARISON:  None.  FINDINGS: An avulsion fracture off of the tip of the fibula is noted with overlying soft tissue swelling.  There is no evidence of subluxation or dislocation.  The talus is unremarkable.  No other bony abnormalities are noted.  IMPRESSION: Fibular tip avulsion fracture.   Electronically Signed   By: Hassan Rowan M.D.   On: 06/12/2014 18:18   Ct Foot Left Wo Contrast  06/12/2014   CLINICAL DATA:  Left foot and ankle pain following twisting injury. Evaluate for Lisfranc injury. Initial encounter.  EXAM: CT OF THE LEFT FOOT WITHOUT CONTRAST  TECHNIQUE: Multidetector CT imaging of the left foot was performed according to the standard protocol. Multiplanar CT image reconstructions were also generated.  COMPARISON:  Radiographs same date.  FINDINGS: There is a comminuted and mildly displaced intra-articular fracture of the distal fibula extending proximal to the ankle mortise. This fracture extends into the distal tibia fibular joint which is not significantly widened. The distal tibia is intact. The talar dome, subtalar joint and calcaneus are intact.  There is a Lisfranc joint injury with  lateral subluxation of the first and second metatarsal bases. There are mildly comminuted intra-articular fractures involving the bases of the first and second metatarsals, all of the cuneiform bones and the cuboid. The navicular bone is intact.  Beyond the bases, the metatarsals are intact. The digits appear intact.  There is lateral soft tissue swelling at the ankle extending into the dorsum of the midfoot. The ankle tendons appear intact.  IMPRESSION: 1. Comminuted intra-articular fracture of the distal fibula as described. There is no widening of the ankle mortise, distal tibial or talar dome injury. 2. Lisfranc fracture subluxation injury as described with fractures of the first and second metatarsal bases, the cuneiform bones and the cuboid.   Electronically Signed   By: Camie Patience M.D.   On: 06/12/2014 19:49   Dg Foot Complete Left  06/16/2014   CLINICAL DATA:  Post intraoperative repair left foot Lisfranc fracture  EXAM: LEFT FOOT - COMPLETE 3+ VIEW  COMPARISON:  06/12/2014  FINDINGS: Three views of the left foot submitted. The patient is status post intraoperative repair of Lisfranc fracture. Metallic fixation screws are noted first and second tarsal metatarsal joint. There is metallic fixation plate and screws in distal fibula. Anatomic alignment.  IMPRESSION: Metallic fixation screws are noted first and second metatarsal phalangeal joints. Metallic fixation plate and screws in distal fibula. There is anatomic alignment.   Electronically Signed   By: Lahoma Crocker M.D.   On: 06/16/2014 10:13   Dg Foot Complete Left  06/12/2014   CLINICAL DATA:  she was just walking down a curve and her  ankle gave up on her; most pain at left ankle  EXAM: LEFT FOOT - COMPLETE 3+ VIEW  COMPARISON:  Ankle film 06/12/2014  FINDINGS: There is irregularity along the lateral border of the medial cuneiform. There is poor alignment of the second metatarsal and the cuneiforms. The cuboid bone is irregular.  There is a distal  fibular fracture noted.  Calcaneus normal.  IMPRESSION: Concern for Lisfranc injury of the midfoot with fracture of the medial cuneiform and subluxation of the tarsal metatarsal joint. Recommend CT of foot.  Findings conveyed toJOHN BEDNAR on 06/12/2014  at18:28.   Electronically Signed   By: Suzy Bouchard M.D.   On: 06/12/2014 18:29   Mm Digital Diagnostic Bilat  06/08/2014   CLINICAL DATA:  43 year old female with diffuse bilateral breast pain which she attributes to her thyroid issues.  EXAM: DIGITAL DIAGNOSTIC  BILATERAL MAMMOGRAM WITH CAD  COMPARISON:  Previous exams.  ACR Breast Density Category c: The breast tissue is heterogeneously dense, which may obscure small masses.  FINDINGS: No suspicious masses or calcifications are seen in either breast. There is no mammographic evidence of malignancy in either breast.  Mammographic images were processed with CAD.  IMPRESSION: No mammographic evidence of malignancy in either breast.  RECOMMENDATION: 1. Recommend further management of diffuse nonfocal bilateral breast pain be based on clinical grounds.  2.  Screening mammogram in one year.(Code:SM-B-01Y)  I have discussed the findings and recommendations with the patient. Results were also provided in writing at the conclusion of the visit. If applicable, a reminder letter will be sent to the patient regarding the next appointment.  BI-RADS CATEGORY  1: Negative.   Electronically Signed   By: Everlean Alstrom M.D.   On: 06/08/2014 11:13   Dg C-arm 1-60 Min  06/16/2014   CLINICAL DATA:  Open reduction internal fixation left fibular fracture repair  EXAM: DG C-ARM 61-120 MIN; LEFT ANKLE COMPLETE - 3+ VIEW  TECHNIQUE: Three intraoperative views of the left ankle submitted.  CONTRAST:  None  FLUOROSCOPY TIME:  31 seconds  COMPARISON:  06/12/2014  FINDINGS: Three views submitted for interpretation. The patient is status post distal left fibular fracture repair. There is a metallic fixation plate and metallic  fixation screws are noted in distal left fibula. There is anatomic alignment. Ankle mortise is preserved.  IMPRESSION: Metallic fixation plate and screws in distal left fibula anatomic alignment.   Electronically Signed   By: Lahoma Crocker M.D.   On: 06/16/2014 10:19    Disposition: [facility]      Discharge Instructions    Call MD / Call 911    Complete by:  As directed   If you experience chest pain or shortness of breath, CALL 911 and be transported to the hospital emergency room.  If you develope a fever above 101 F, pus (white drainage) or increased drainage or redness at the wound, or calf pain, call your surgeon's office.     Constipation Prevention    Complete by:  As directed   Drink plenty of fluids.  Prune juice may be helpful.  You may use a stool softener, such as Colace (over the counter) 100 mg twice a day.  Use MiraLax (over the counter) for constipation as needed.     Diet - low sodium heart healthy    Complete by:  As directed      Discharge instructions    Complete by:  As directed   Keep short leg splint and dressing dry.  May use water impervious bag or cast bag and tub chair to shower Tape the top of bag to skin to avoid moisture soaking the dressing on the leg. Call if there is odor or saturation of dressing or worsening pain not controlled with medications. Call if fever greater than 101.5. Use crutches or walker no weight bearing on the ankle fracture leg. Please follow up with an appointment with Dr.Xu in 2 weeks from the time of surgery. Elevate as often as possible during the first week after surgery gradually increasing the time the leg is dependent or down there after. If swelling recurrs then elevate again. Wheel chair for longer distances. Take anticoagulant daily as prescribed.     Driving restrictions    Complete by:  As directed   No driving.     Increase activity slowly as tolerated    Complete by:  As directed      Lifting restrictions     Complete by:  As directed   No lifting for 8 weeks           Follow-up Information    Follow up with Marianna Payment, MD. Schedule an appointment as soon as possible for a visit on 06/14/2014.   Specialty:  Orthopedic Surgery   Contact information:   [place] [address] [phone]       Follow up with Marianna Payment, MD In 2 weeks.   Specialty:  Orthopedic Surgery   Why:  For suture removal, For wound re-check   Contact information:   [address] [place] [address] [phone]        Signed: Jessy Oto 06/17/2014, 9:58 AM

## 2014-06-17 NOTE — Progress Notes (Signed)
Patient ID: Samantha Smith, female   DOB: 1971-05-12, 43 y.o.   MRN: [id number] Subjective: 1 Day Post-Op Procedure(s) (LRB): OPEN REDUCTION INTERNAL FIXATION (ORIF) LEFT ANKLE, LISFRANC FRACTURE (Left) Awake, alert and oriented x 4. Husband assisting with transfers to and from transfer chair to bed. Tolerating pain meds well. Scheduled for discharge today. Needs work on transfers bed to w/c and to bedside chair. Patient reports pain as moderate.    Objective:   VITALS:  Temp:  [98 F (36.7 C)-98.6 F (37 C)] 98.1 F (36.7 C) (11/26 0403) Pulse Rate:  [59-76] 64 (11/26 0403) Resp:  [9-20] 16 (11/25 1142) BP: (94-110)/(42-78) 98/78 mmHg (11/26 0552) SpO2:  [95 %-100 %] 98 % (11/26 0403)  Neurologically intact ABD soft Neurovascular intact Sensation intact distally Intact pulses distally Dorsiflexion/Plantar flexion intact Incision: dressing C/D/I Compartment soft   LABS  Recent Labs  06/16/14 1518  HGB 12.3  WBC 9.3  PLT 278    Recent Labs  06/16/14 1518  CREATININE 0.57   No results for input(s): LABPT, INR in the last 72 hours.   Assessment/Plan: 1 Day Post-Op Procedure(s) (LRB): OPEN REDUCTION INTERNAL FIXATION (ORIF) LEFT ANKLE, LISFRANC FRACTURE (Left)  Advance diet Up with therapy Discharge home with home health  NITKA,JAMES E 06/17/2014, 9:51 AM

## 2014-06-17 NOTE — Plan of Care (Signed)
Problem: Phase II Progression Outcomes Goal: Surgical site without signs of infection Outcome: Completed/Met Date Met:  06/17/14 Goal: Dressings dry/intact Outcome: Completed/Met Date Met:  06/17/14 Goal: Sutures/staples intact Outcome: Not Applicable Date Met:  29/19/16

## 2014-06-18 MED ORDER — HYDROCODONE-ACETAMINOPHEN 5-325 MG PO TABS: 1.0000 | ORAL_TABLET | ORAL | Status: DC | PRN

## 2014-06-18 MED ORDER — METHOCARBAMOL 500 MG PO TABS: 500.0000 mg | ORAL_TABLET | Freq: Four times a day (QID) | ORAL | Status: DC | PRN

## 2014-06-18 MED ORDER — ENOXAPARIN SODIUM 40 MG/0.4ML ~~LOC~~ SOLN: 40.0000 mg | Freq: Every day | SUBCUTANEOUS | Status: DC

## 2014-06-18 NOTE — Plan of Care (Signed)
Problem: Phase II Progression Outcomes Goal: Bed to chair transfers BID Outcome: Not Applicable Date Met:  53/20/23 Goal: Discharge plan established Outcome: Completed/Met Date Met:  06/18/14 Goal: Other Phase II Outcomes/Goals Outcome: Not Applicable Date Met:  34/35/68  Problem: Phase III Progression Outcomes Goal: Ambulate BID Outcome: Completed/Met Date Met:  06/18/14 Goal: Maintains weight - bearing per MD orders Outcome: Completed/Met Date Met:  06/18/14 Goal: Discharge plan remains appropriate-arrangements made Outcome: Completed/Met Date Met:  06/18/14 Goal: Other Phase III Outcomes/Goals Outcome: Not Applicable Date Met:  61/68/37  Problem: Discharge Progression Outcomes Goal: Barriers To Progression Addressed/Resolved Outcome: Completed/Met Date Met:  06/18/14 Goal: Discharge plan in place and appropriate Outcome: Completed/Met Date Met:  06/18/14 Goal: Pain controlled with appropriate interventions Outcome: Completed/Met Date Met:  06/18/14 Goal: Hemodynamically stable Outcome: Completed/Met Date Met:  06/18/14 Goal: CMS at or above baseline Outcome: Completed/Met Date Met:  06/18/14 Goal: Dressing/splint clean, dry, intact, no S/S of infection Outcome: Not Applicable Date Met:  29/02/11 Goal: Complications resolved/controlled Outcome: Completed/Met Date Met:  06/18/14 Goal: Tolerates diet Outcome: Completed/Met Date Met:  06/18/14 Goal: Ambulates safely using assistive device Outcome: Completed/Met Date Met:  06/18/14 Goal: Activity appropriate for discharge plan Outcome: Completed/Met Date Met:  06/18/14 Goal: Other Discharge Outcomes/Goals Outcome: Not Applicable Date Met:  15/52/08

## 2014-06-18 NOTE — Plan of Care (Signed)
Problem: Discharge Progression Outcomes Goal: Pain controlled with appropriate interventions Outcome: Progressing     

## 2014-06-18 NOTE — Evaluation (Signed)
Physical Therapy Re- Evaluation Patient Details Name: Samantha Smith MRN: [id number] DOB: [birth year] Today's Date: 06/18/2014   History of Present Illness  43 yo female adm after mechanical fall resulting in bil ankle injuries;  xray R ankle + fibula avulsion fx, cam boot, no sugery recommended ; L ankle lisfranc fx -- surgery 06/16/2014  PMHx: lap band in 2008, brain tumor excision.  Pt. underwent ORIF ogf L ankle fx on 06/16/14.  Pt. now NWB L LE and full WB R LE   Clinical Impression  Pt. Now post op ORIF L ankle with decrease in mobility due to NWB status and injury to R LE, though FWB.  Pt. Had been up in recliner chair until just before my arrival and she was not willing to practice transfers at this time.  She verbalized concern over getting up 3 steps via wheelchair.  I educated pt., her dad and her mom in the safest WC technique for negotiating steps.  I provided handout and reviewed with pt.  I will set goals for further follow up, however I believe pt. Will be [place] later today when husband arrives.  Recommend HHPT follow up for further mobility training.      Follow Up Recommendations Home health PT;Supervision for mobility/OOB    Equipment Recommendations  None recommended by PT    Recommendations for Other Services       Precautions / Restrictions Precautions Precautions: Fall Precaution Comments: due to NWB status of left foot.  Required Braces or Orthoses: Other Brace/Splint Other Brace/Splint: [place] on RLE Restrictions Weight Bearing Restrictions: Yes RLE Weight Bearing: Weight bearing as tolerated LLE Weight Bearing: Non weight bearing      Mobility  Bed Mobility Overal bed mobility:  (pt. declined, just back to bed)                Transfers Overall transfer level:  (pt. declined, just back to bed; )                  Ambulation/Gait Ambulation/Gait assistance:  (pt. declined, justy back to bed)              Stairs   See w/c mobility  section           Information systems manager mobility: Yes Wheelchair Assistance Details (indicate cue type and reason): Pt. verbalizes she remains compliant with NWB status in transfers to recliner and back to bed; Declined to practice OOB as she had just gotten back into bed.  I brought pt's Dad and Mom to PT gym to instruct in how to negotiate steps in wheelchair, as this is pt's primary area of concern.  I educated Dad on holing to stationary parts only and for pt's husband to take the lead role at the head of the w/c.  I then took w/c into pt's room and educated her on the safe and correct techncique, allowed her to ask questions and answered accordingly.  I provided a written handout on technique for review by pt's husband when he arrives.  Pt. states she feels confident in their ability to assist her into the home in a w/c.    Modified Rankin (Stroke Patients Only)       Balance  Pertinent Vitals/Pain Pain Assessment: 0-10 Pain Score: 5  Pain Location: left ankle Pain Descriptors / Indicators: Aching;Burning;Throbbing Pain Intervention(s): Limited activity within patient's tolerance;Repositioned;Other (comment) (L LE on 2 pillows)    Home Living Family/patient expects to be discharged to:: Private residence Living Arrangements: Spouse/significant other Available Help at Discharge: Family;Available 24 hours/day Type of Home: House Home Access: Stairs to enter Entrance Stairs-Rails: Psychiatric nurse of Steps: 3 Home Layout: Two level;Bed/bath upstairs Home Equipment: Walker - 2 wheels;Bedside commode;Wheelchair - manual Additional Comments: has access to knee scooter that was her mother's    Prior Function Level of Independence: Independent               Hand Dominance   Dominant Hand: Right    Extremity/Trunk Assessment   Upper Extremity Assessment: RUE  deficits/detail RUE Deficits / Details: bil UEs grossly 3+ to 4/5         Lower Extremity Assessment: RLE deficits/detail;LLE deficits/detail RLE Deficits / Details: at least 3/5 bil LEs       Communication   Communication: No difficulties  Cognition Arousal/Alertness: Awake/alert Behavior During Therapy: WFL for tasks assessed/performed Overall Cognitive Status: Within Functional Limits for tasks assessed                      General Comments      Exercises        Assessment/Plan    PT Assessment All further PT needs can be met in the next venue of care  PT Diagnosis Acute pain;Difficulty walking   PT Problem List Decreased strength;Decreased activity tolerance;Decreased mobility;Decreased knowledge of use of DME;Pain;Obesity;Decreased knowledge of precautions  PT Treatment Interventions DME instruction;Gait training;Stair training;Functional mobility training;Therapeutic activities;Therapeutic exercise;Patient/family education;Wheelchair mobility training   PT Goals (Current goals can be found in the Care Plan section) Acute Rehab PT Goals Patient Stated Goal: home with family assist PT Goal Formulation: With patient Time For Goal Achievement: 06/25/14 Potential to Achieve Goals: Good    Frequency Min 6X/week   Barriers to discharge        Co-evaluation               End of Session   Activity Tolerance: Patient limited by pain Patient left: in bed;with call bell/phone within reach;with family/visitor present Nurse Communication: Mobility status         Time: 1407-1430 PT Time Calculation (min) (ACUTE ONLY): 23 min   Charges:   PT Evaluation $PT Re-evaluation: 1 Procedure     PT G CodesLadona Ridgel 06/18/2014, 2:50 PM Gerlean Ren PT Acute Rehab Services [phone] [place] [phone]

## 2014-06-18 NOTE — Care Management Note (Signed)
CARE MANAGEMENT NOTE 06/18/2014  Patient:  KATHLEENA, FREEMAN   Account Number:  [id number]  Date Initiated:  06/13/2014  Documentation initiated by:  [facility]  Subjective/Objective Assessment:   Right distal fibula avulsion fx, Left lateral malleolus fracture, Left bony lisfranc fx  06/16/14 patient had ORIF left ankle Lisfranc fracture.     Action/Plan:   PT/OT eval pending  11/27- Cm spoke with patient concerning home health needs. Choice offered. Referral called to [facility] liaison.   Anticipated DC Date:  06/17/2014   Anticipated DC Plan:  [facility]  CM consult      [place]   Choice offered to / List presented to:  C-1 Patient   DME arranged  HOSPITAL BED      DME agency  [place] arranged  [place] RN      [place].   Status of service:  Completed, signed off  Discharge Disposition:  [place]  Per UR Regulation:  Reviewed for med. necessity/level of care/duration of stay

## 2014-06-18 NOTE — Plan of Care (Signed)
Problem: Acute Rehab PT Goals(only PT should resolve) Goal: Pt Will Go Up/Down Stairs With wheelchair on 3 steps and +2 family members

## 2014-06-18 NOTE — Progress Notes (Signed)
Patient ID: Samantha Smith, female   DOB: Jan 21, 1971, 43 y.o.   MRN: [id number] Subjective: 2 Days Post-Op Procedure(s) (LRB): OPEN REDUCTION INTERNAL FIXATION (ORIF) LEFT ANKLE, LISFRANC FRACTURE (Left) Awakens easily, alert and oriented x 4. Hospital bed was delivered last night, I was not informed of this. Patient reports pain as moderate.    Objective:   VITALS:  Temp:  [98 F (36.7 C)-98.4 F (36.9 C)] 98 F (36.7 C) (11/27 0525) Pulse Rate:  [62-73] 73 (11/27 0525) Resp:  [18] 18 (11/27 0525) BP: (96-121)/(65-78) 121/74 mmHg (11/27 0525) SpO2:  [97 %-98 %] 97 % (11/27 0525)  Neurologically intact ABD soft Dorsiflexion/Plantar flexion intact Incision: dressing C/D/I   LABS  Recent Labs  06/16/14 1518  HGB 12.3  WBC 9.3  PLT 278    Recent Labs  06/16/14 1518  CREATININE 0.57   No results for input(s): LABPT, INR in the last 72 hours.   Assessment/Plan: 2 Days Post-Op Procedure(s) (LRB): OPEN REDUCTION INTERNAL FIXATION (ORIF) LEFT ANKLE, LISFRANC FRACTURE (Left)  Advance diet Up with therapy D/C IV fluids Discharge home with home health  Unable to discharge due to late hospital bed delivery, medications written not tolerated, not educated on lovenox injection techniques and self administration.  Nicholous Girgenti E 06/18/2014, 9:07 AM

## 2014-06-18 NOTE — Progress Notes (Signed)
Lovenox teaching done with Pt and her family. Pts mom is a retired Therapist, sports and verbalized understanding of how to admin injection and was comfortable with administering lovenox at home.

## 2014-06-21 ENCOUNTER — Encounter (HOSPITAL_COMMUNITY): Payer: Self-pay | Admitting: Orthopaedic Surgery

## 2014-06-24 ENCOUNTER — Telehealth: Payer: Self-pay | Admitting: Internal Medicine

## 2014-06-24 MED ORDER — LIDOCAINE 5 % EX OINT: 1.0000 "application " | TOPICAL_OINTMENT | Freq: Three times a day (TID) | CUTANEOUS | Status: DC | PRN

## 2014-06-24 NOTE — Telephone Encounter (Signed)
On call note:  PT called on behalf of the pt with c/o pain in the rectal area after a large BM (no BM x 1 week post-op due to pain killers). Pt had some minor bleeding as well. BMs are good on Miralax now. No abd pain; no n/v. Pt is refusing pain meds.  Emailed Lido ointment for fissures to [employer]. Ice pack to anal area. OV w/Dr Larose Kells tomorrow ER if worse Tylenol or Ibuprofen for pain prn

## 2014-06-25 NOTE — Telephone Encounter (Signed)
Call and check on her. Schedule a visit with Dr Larose Kells next week unless she has worsening symptoms.

## 2014-06-25 NOTE — Telephone Encounter (Signed)
Please advise 

## 2014-06-25 NOTE — Telephone Encounter (Signed)
Spoke with Pt, informed me that the lidocaine ointment given to her by Dr. Alain Marion has helped and doesn't wish to make an appt at this time. States she will call and make appt if she begins hurting again.

## 2014-06-28 NOTE — Telephone Encounter (Signed)
To PCP FYI

## 2014-06-28 NOTE — Telephone Encounter (Signed)
Thanks

## 2014-06-29 ENCOUNTER — Other Ambulatory Visit: Payer: Self-pay | Admitting: Orthopaedic Surgery

## 2014-06-29 ENCOUNTER — Ambulatory Visit
Admission: RE | Admit: 2014-06-29 | Discharge: 2014-06-29 | Disposition: A | Discharge status: Home / Self Care | Payer: BC Managed Care – PPO | Source: Ambulatory Visit | Attending: Orthopaedic Surgery | Admitting: Orthopaedic Surgery

## 2014-06-29 DIAGNOSIS — M79605 Pain in left leg: Secondary | ICD-10-CM

## 2014-06-29 DIAGNOSIS — R609 Edema, unspecified: Secondary | ICD-10-CM

## 2014-07-08 ENCOUNTER — Other Ambulatory Visit: Payer: BC Managed Care – PPO

## 2014-08-05 ENCOUNTER — Ambulatory Visit: Payer: BLUE CROSS/BLUE SHIELD | Attending: Orthopaedic Surgery | Admitting: Physical Therapy

## 2014-08-05 DIAGNOSIS — Z4789 Encounter for other orthopedic aftercare: Secondary | ICD-10-CM | POA: Insufficient documentation

## 2014-08-05 DIAGNOSIS — M25572 Pain in left ankle and joints of left foot: Secondary | ICD-10-CM | POA: Insufficient documentation

## 2014-08-09 ENCOUNTER — Ambulatory Visit: Payer: BLUE CROSS/BLUE SHIELD | Admitting: Physical Therapy

## 2014-08-09 DIAGNOSIS — Z4789 Encounter for other orthopedic aftercare: Secondary | ICD-10-CM | POA: Diagnosis not present

## 2014-08-11 ENCOUNTER — Telehealth: Payer: Self-pay | Admitting: *Deleted

## 2014-08-11 NOTE — Telephone Encounter (Signed)
Pt called asking if lab appointment needed for recheck of TSH level. I called pt back and informed her yes lab appointment.

## 2014-08-13 ENCOUNTER — Ambulatory Visit: Payer: BLUE CROSS/BLUE SHIELD | Admitting: Physical Therapy

## 2014-08-16 ENCOUNTER — Other Ambulatory Visit: Payer: BLUE CROSS/BLUE SHIELD

## 2014-08-16 DIAGNOSIS — R7989 Other specified abnormal findings of blood chemistry: Secondary | ICD-10-CM

## 2014-08-16 LAB — TSH: TSH: 4.188 u[IU]/mL (ref 0.350–4.500)

## 2014-08-17 ENCOUNTER — Ambulatory Visit: Payer: BLUE CROSS/BLUE SHIELD | Admitting: Physical Therapy

## 2014-08-17 ENCOUNTER — Other Ambulatory Visit: Payer: Self-pay | Admitting: Gynecology

## 2014-08-17 DIAGNOSIS — R7989 Other specified abnormal findings of blood chemistry: Secondary | ICD-10-CM

## 2014-08-17 MED ORDER — LEVOTHYROXINE SODIUM 75 MCG PO TABS
75.0000 ug | ORAL_TABLET | Freq: Every day | ORAL | Status: DC
Start: 2014-08-17 — End: 2014-09-29

## 2014-08-19 ENCOUNTER — Ambulatory Visit: Payer: BLUE CROSS/BLUE SHIELD | Admitting: Physical Therapy

## 2014-08-19 DIAGNOSIS — Z4789 Encounter for other orthopedic aftercare: Secondary | ICD-10-CM | POA: Diagnosis not present

## 2014-08-24 ENCOUNTER — Ambulatory Visit: Payer: BLUE CROSS/BLUE SHIELD | Attending: Orthopaedic Surgery | Admitting: Physical Therapy

## 2014-08-24 DIAGNOSIS — M25572 Pain in left ankle and joints of left foot: Secondary | ICD-10-CM | POA: Diagnosis not present

## 2014-08-24 DIAGNOSIS — Z4789 Encounter for other orthopedic aftercare: Secondary | ICD-10-CM | POA: Insufficient documentation

## 2014-08-26 ENCOUNTER — Ambulatory Visit: Payer: BLUE CROSS/BLUE SHIELD | Admitting: Physical Therapy

## 2014-08-26 DIAGNOSIS — Z4789 Encounter for other orthopedic aftercare: Secondary | ICD-10-CM | POA: Diagnosis not present

## 2014-08-31 ENCOUNTER — Ambulatory Visit: Payer: BLUE CROSS/BLUE SHIELD | Admitting: Physical Therapy

## 2014-09-02 ENCOUNTER — Ambulatory Visit: Payer: BLUE CROSS/BLUE SHIELD | Admitting: Physical Therapy

## 2014-09-02 DIAGNOSIS — Z4789 Encounter for other orthopedic aftercare: Secondary | ICD-10-CM | POA: Diagnosis not present

## 2014-09-29 ENCOUNTER — Ambulatory Visit (HOSPITAL_COMMUNITY)
Admission: RE | Admit: 2014-09-29 | Discharge: 2014-09-29 | Disposition: A | Discharge status: Home / Self Care | Payer: BLUE CROSS/BLUE SHIELD | Source: Ambulatory Visit | Attending: Emergency Medicine | Admitting: Emergency Medicine

## 2014-09-29 ENCOUNTER — Ambulatory Visit (INDEPENDENT_AMBULATORY_CARE_PROVIDER_SITE_OTHER): Payer: BLUE CROSS/BLUE SHIELD | Admitting: Emergency Medicine

## 2014-09-29 VITALS — BP 99/70 | HR 76 | Temp 97.7°F | Resp 12 | Ht 63.0 in | Wt 265.2 lb

## 2014-09-29 DIAGNOSIS — R42 Dizziness and giddiness: Secondary | ICD-10-CM | POA: Insufficient documentation

## 2014-09-29 DIAGNOSIS — H538 Other visual disturbances: Secondary | ICD-10-CM | POA: Insufficient documentation

## 2014-09-29 DIAGNOSIS — R631 Polydipsia: Secondary | ICD-10-CM | POA: Diagnosis not present

## 2014-09-29 DIAGNOSIS — G932 Benign intracranial hypertension: Secondary | ICD-10-CM | POA: Diagnosis not present

## 2014-09-29 DIAGNOSIS — R51 Headache: Secondary | ICD-10-CM | POA: Insufficient documentation

## 2014-09-29 LAB — POCT GLYCOSYLATED HEMOGLOBIN (HGB A1C): Hemoglobin A1C: 5.1

## 2014-09-29 LAB — GLUCOSE, POCT (MANUAL RESULT ENTRY): POC Glucose: 73 mg/dl (ref 70–99)

## 2014-09-29 NOTE — Progress Notes (Signed)
[facility] [address] [place] [address] [phone]  Date:  09/29/2014   Name:  Samantha Smith   DOB:  10-04-1970   MRN:  [id number]  PCP:  Kathlene November, MD    Chief Complaint: Pressure in Head; Nausea; and Polydipsia   History of Present Illness:  Samantha Smith is a 44 y.o. very pleasant female patient who presents with the following:  Patient has a complaint of a headache on the crown over the past week Associated with "floaters" and nausea and vomiting. No stool change No fever or chills  No antecedent illness or injury History of pseudotumor Finds it difficult to concentrate and remain focused at work She says these are the same symptoms she sought care for when she was diagnosed with her pseudotumor Has polyuria and polydipsia and is always thirsty. No history of diabetes No improvement with over the counter medications or other home remedies. Denies other complaint or health concern today.   Patient Active Problem List   Diagnosis Date Noted  . Bilateral ankle fractures 06/13/2014  . Lisfranc's sprain 06/13/2014  . Transient hypotension 06/13/2014  . Skin lesion 11/10/2012  . Positive PPD   . Thyroid disease   . STD (sexually transmitted disease)   . Pseudotumor cerebri   . Morbid obesity   . LGSIL (low grade squamous intraepithelial dysplasia)   . Paresthesias 02/06/2011  . DEFICIENCY, VITAMIN D NOS 02/03/2007  . CND/STAT, MOTHER CMPLG PRG, BRTRIC SRG PP 02/03/2007  . PSTPRC STATUS, BARIATRIC SURGERY 02/03/2007    Past Medical History  Diagnosis Date  . Positive PPD     s/p abx per patient  . Thyroid disease   . STD (sexually transmitted disease)     Chlamydia history  . Blurred vision   . Morbid obesity   . ASCUS (atypical squamous cells of undetermined significance) on Pap smear 12/2012  . 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 Cervical cancer 02/2013    Stage IB1 poorly differentiated squamous cell    Past Surgical History   Procedure Laterality Date  . Cholecystectomy  12/01  . Right fallopian tube tied  2005    RIGHT  . Laparoscopic gastric banding  2008    see OV 06/09/08- ?Malfunction  . Ectopic pregnancy surgery      LEFT  . Left fallopian tube removed  2011  . Brain tumor excision    . Laparoscopic radical total hysterectomy w/ node biopsy, right salpingectomy  02/2013    [facility]  . Orif ankle fracture Left 06/16/2014    Procedure: OPEN REDUCTION INTERNAL FIXATION (ORIF) LEFT ANKLE, LISFRANC FRACTURE;  Surgeon: Marianna Payment, MD;  Location: [facility];  Service: Orthopedics;  Laterality: Left;  . Abdominal hysterectomy      History  Substance Use Topics  . Smoking status: Never Smoker   . Smokeless tobacco: Never Used  . Alcohol Use: No    Family History  Problem Relation Age of Onset  . Hypertension Father   . Coronary artery disease Neg Hx   . Stroke Neg Hx   . Colon cancer Neg Hx   . Breast cancer Neg Hx   . Heart disease Sister   . Diabetes Maternal Grandmother     Allergies  Allergen Reactions  . Demeclocycline     Other reaction(s): Other (See Comments) [place]?  . Tetracyclines & Related Other (See Comments)    Pseudo tumor  . Adhesive [Tape] Hives    Medication  list has been reviewed and updated.  No current outpatient prescriptions on file prior to visit.   No current facility-administered medications on file prior to visit.    Review of Systems:  .prevu   Physical Examination: Filed Vitals:   09/29/14 1927  BP: 99/70  Pulse: 76  Temp: 97.7 F (36.5 C)  Resp: 12   Filed Vitals:   09/29/14 1927  Height: 5\' 3"  (1.6 m)  Weight: 265 lb 4 oz (120.317 kg)   Body mass index is 47 kg/(m^2). Ideal Body Weight: Weight in (lb) to have BMI = 25: 140.8  GEN: morbid obesity, NAD, Non-toxic, A & O x 3 HEENT: Atraumatic, Normocephalic. Neck supple. No masses, No LAD. Ears and Nose: No external deformity. CV: RRR, No M/G/R. No JVD. No thrill. No extra heart  sounds. PULM: CTA B, no wheezes, crackles, rhonchi. No retractions. No resp. distress. No accessory muscle use. ABD: S, NT, ND, +BS. No rebound. No HSM. EXTR: No c/c/e NEURO Normal gait. CN 2-12 intact.  PRRERLA PSYCH: Normally interactive. Conversant. Not depressed or anxious appearing.  Calm demeanor.    Assessment and Plan: Pseudotumor by history  Headache Negative CT fioricet   Signed,  Ellison Carwin, MD

## 2014-09-30 ENCOUNTER — Telehealth: Payer: Self-pay | Admitting: Family Medicine

## 2014-09-30 NOTE — Telephone Encounter (Signed)
Patient called requesting CT results. °Please advise. °

## 2014-09-30 NOTE — Telephone Encounter (Signed)
Spoke to Mr Withers, patients husband. Gave him the CT results.  spoke to [place] about the  Results, she states she is still having headaches and dizziness. Wants to know what to do . Please advise

## 2014-10-01 MED ORDER — BUTALBITAL-APAP-CAFFEINE 50-325-40 MG PO TABS: 1.0000 | ORAL_TABLET | Freq: Four times a day (QID) | ORAL | Status: DC | PRN

## 2014-10-01 NOTE — Telephone Encounter (Signed)
She can pick up a prescription after 10.

## 2014-10-04 MED ORDER — BUTALBITAL-APAP-CAFFEINE 50-325-40 MG PO TABS: 1.0000 | ORAL_TABLET | Freq: Four times a day (QID) | ORAL | Status: DC | PRN

## 2014-10-04 NOTE — Addendum Note (Signed)
Addended by: Burnis Kingfisher on: 10/04/2014 02:59 PM   Modules accepted: Orders

## 2014-10-04 NOTE — Telephone Encounter (Signed)
Called and left pt an message on her cell phone letting her know that she can pick up her Rx today and bring ID.

## 2014-10-27 ENCOUNTER — Other Ambulatory Visit: Payer: Self-pay

## 2014-12-06 ENCOUNTER — Telehealth: Payer: Self-pay | Admitting: Internal Medicine

## 2014-12-06 NOTE — Telephone Encounter (Signed)
Pre Visit letter sent  °

## 2014-12-09 ENCOUNTER — Other Ambulatory Visit: Payer: Self-pay

## 2014-12-12 ENCOUNTER — Other Ambulatory Visit: Payer: Self-pay | Admitting: Gynecology

## 2014-12-13 ENCOUNTER — Telehealth: Payer: Self-pay

## 2014-12-13 MED ORDER — LEVOTHYROXINE SODIUM 75 MCG PO TABS: 75.0000 ug | ORAL_TABLET | Freq: Every day | ORAL | Status: DC

## 2014-12-13 NOTE — Telephone Encounter (Signed)
Patient on vacation. Left her Synthroid at home. Needs Rx called in for 4 tabs. Rx sent. Spoke with patient and reminded her in Nov Dr. Loetta Rough recommended rechecking TSH in 2-3 mos. She needs to come for that lab when she returns home.

## 2014-12-21 ENCOUNTER — Other Ambulatory Visit: Payer: BLUE CROSS/BLUE SHIELD

## 2014-12-21 ENCOUNTER — Other Ambulatory Visit: Payer: Self-pay

## 2014-12-21 DIAGNOSIS — R7989 Other specified abnormal findings of blood chemistry: Secondary | ICD-10-CM

## 2014-12-22 ENCOUNTER — Other Ambulatory Visit: Payer: Self-pay

## 2014-12-22 LAB — TSH: TSH: 2.961 u[IU]/mL (ref 0.350–4.500)

## 2014-12-22 MED ORDER — LEVOTHYROXINE SODIUM 75 MCG PO TABS: 75.0000 ug | ORAL_TABLET | Freq: Every day | ORAL | Status: DC

## 2014-12-22 NOTE — Telephone Encounter (Signed)
I spoke with patient about her TSH being normal and staying on same med.dose. She said she took last pill yesterday and needs refills. Rx sent.

## 2014-12-24 ENCOUNTER — Telehealth: Payer: Self-pay | Admitting: *Deleted

## 2014-12-24 NOTE — Telephone Encounter (Signed)
Unable to reach patient at time of Pre-Visit Call.  Left message for patient to return call when available.    

## 2014-12-27 ENCOUNTER — Ambulatory Visit (INDEPENDENT_AMBULATORY_CARE_PROVIDER_SITE_OTHER): Payer: BLUE CROSS/BLUE SHIELD | Admitting: Internal Medicine

## 2014-12-27 ENCOUNTER — Encounter: Payer: Self-pay | Admitting: Internal Medicine

## 2014-12-27 VITALS — BP 124/76 | HR 74 | Temp 97.7°F | Ht 63.0 in | Wt 271.0 lb

## 2014-12-27 DIAGNOSIS — Z0000 Encounter for general adult medical examination without abnormal findings: Secondary | ICD-10-CM | POA: Diagnosis not present

## 2014-12-27 DIAGNOSIS — G932 Benign intracranial hypertension: Secondary | ICD-10-CM

## 2014-12-27 DIAGNOSIS — Z23 Encounter for immunization: Secondary | ICD-10-CM | POA: Diagnosis not present

## 2014-12-27 DIAGNOSIS — E079 Disorder of thyroid, unspecified: Secondary | ICD-10-CM

## 2014-12-27 MED ORDER — LEVOTHYROXINE SODIUM 75 MCG PO TABS: 75.0000 ug | ORAL_TABLET | Freq: Every day | ORAL | Status: DC

## 2014-12-27 NOTE — Progress Notes (Signed)
Pre visit review using our clinic review tool, if applicable. No additional management support is needed unless otherwise documented below in the visit note. 

## 2014-12-27 NOTE — Assessment & Plan Note (Signed)
Last TSH satisfactory, patient is quite concerned about her weight gain and likes to check a free T3 free T4 in addition to her TSH. To reassure her  will go ahead and set up for a free T3, free T4 and TSH in 4 months.

## 2014-12-27 NOTE — Assessment & Plan Note (Signed)
Currently asymptomatic 

## 2014-12-27 NOTE — Assessment & Plan Note (Signed)
Td 09 Pneumonia shot today Female care per gynecology, history of cervical cancer Mammogram 05-2014 negative  History of remote the scope, no report  Check, FLP and vitamin D  Diet and exercise discussed

## 2014-12-27 NOTE — Progress Notes (Signed)
Subjective:    Patient ID: Samantha Smith, female    DOB: [birth year], 44 y.o.   MRN: [id number]  DOS:  12/27/2014 Type of visit - description : cpx Interval history: Had a ankle fracture 05-2014, status post surgery, recuperating well however she has been quite inactive and gain approximately 35 pounds in the last few months.   Review of Systems Constitutional: No fever. No chills. No unexplained wt changes. No unusual sweats  HEENT: No dental problems, no ear discharge, no facial swelling, no voice changes. No eye discharge, no eye  redness , no  intolerance to light   Respiratory: No wheezing , no  difficulty breathing. No cough , no mucus production  Cardiovascular: No CP, no leg swelling , no  Palpitations  GI: no nausea, no vomiting, no diarrhea , no  abdominal pain.  No blood in the stools. No dysphagia, no odynophagia    Endocrine: No polyphagia, no polyuria , no polydipsia  GU: No dysuria, gross hematuria, difficulty urinating. No urinary urgency, no frequency.  Musculoskeletal:  Recovering from ankle surgery, doing better, still has some residual edema  Skin: No change in the color of the skin, palor , no  Rash  Allergic, immunologic: No environmental allergies , no  food allergies  Neurological: No dizziness no  syncope. No headaches. No diplopia, no slurred, no slurred speech, no motor deficits, no facial  Numbness  Hematological: No enlarged lymph nodes, no easy bruising , no unusual bleedings  Psychiatry: No suicidal ideas, no hallucinations, no beavior problems, no confusion.  No unusual/severe anxiety, no depression    Past Medical History  Diagnosis Date  . Positive PPD     s/p abx per patient  . Thyroid disease   . STD (sexually transmitted disease)     Chlamydia history  . Blurred vision   . Morbid obesity   . ASCUS (atypical squamous cells of undetermined significance) on Pap smear 12/2012  . 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 Cervical cancer  02/2013    Stage IB1 poorly differentiated squamous cell  . Pseudotumor cerebri   . CA cervix 02/26/2013    Past Surgical History  Procedure Laterality Date  . Cholecystectomy  12/01  . Right fallopian tube tied  2005    RIGHT  . Laparoscopic gastric banding  2008    see OV 06/09/08- ?Malfunction  . Ectopic pregnancy surgery      LEFT  . Left fallopian tube removed  2011  . Brain tumor excision    . Laparoscopic radical total hysterectomy w/ node biopsy, right salpingectomy  02/2013    [facility]  . Orif ankle fracture Left 06/16/2014    Procedure: OPEN REDUCTION INTERNAL FIXATION (ORIF) LEFT ANKLE, LISFRANC FRACTURE;  Surgeon: Marianna Payment, MD;  Location: [facility];  Service: Orthopedics;  Laterality: Left;  . Abdominal hysterectomy  2014    History   Social History  . Marital Status: Married    Spouse Name: N/A  . Number of Children: 0  . Years of Education: N/A   Occupational History  . Accountant      Social History Main Topics  . Smoking status: Never Smoker   . Smokeless tobacco: Never Used  . Alcohol Use: No  . Drug Use: No  . Sexual Activity: Yes    Birth Control/ Protection: Surgical     Comment: Left fall. tube removed-Right tube tied   Other Topics Concern  . Not on file   Social History Narrative  Family History  Problem Relation Age of Onset  . Hypertension Father   . Coronary artery disease Neg Hx   . Stroke Neg Hx   . Colon cancer Neg Hx   . Breast cancer Mother 70    [name]  . Heart disease Sister   . Diabetes Maternal Grandmother        Medication List       This list is accurate as of: 12/27/14 11:59 PM.  Always use your most recent med list.               levothyroxine 75 MCG tablet  Commonly known as:  SYNTHROID, LEVOTHROID  Take 1 tablet (75 mcg total) by mouth daily before breakfast.           Objective:   Physical Exam BP 124/76 mmHg  Pulse 74  Temp(Src) 97.7 F (36.5 C) (Oral)  Ht 5\' 3"  (1.6 m)   Wt 271 lb (122.925 kg)  BMI 48.02 kg/m2  SpO2 98%  LMP 01/02/2013  General:   Well developed, well nourished . NAD.  Neck:  Full range of motion. Supple. No  Thyromegaly  HEENT:  Normocephalic . Face symmetric, atraumatic Lungs:  CTA B Normal respiratory effort, no intercostal retractions, no accessory muscle use. Heart: RRR,  no murmur.  No pretibial edema bilaterally , some edema   around the left ankle Abdomen:  Not distended, soft, non-tender. No rebound or rigidity. No mass,organomegaly Skin: Exposed areas without rash. Not pale. Not jaundice Neurologic:  alert & oriented X3.  Speech normal, gait appropriate for age and unassisted Strength symmetric and appropriate for age.  Psych: Cognition and judgment appear intact.  Cooperative with normal attention span and concentration.  Behavior appropriate. No anxious or depressed appearing.        Assessment & Plan:

## 2014-12-27 NOTE — Patient Instructions (Signed)
Please get your blood work today  Also come back in 3-4 months for blood work: TSH, free T3, free T4 -----dx hypothyroidism

## 2014-12-28 LAB — LIPID PANEL
Cholesterol: 197 mg/dL (ref 0–200)
HDL: 52.7 mg/dL (ref >39.00)
LDL CALC: 117 mg/dL — AB (ref 0–99)
NONHDL: 144.3
Total CHOL/HDL Ratio: 4
Triglycerides: 137 mg/dL (ref 0.0–149.0)
VLDL: 27.4 mg/dL (ref 0.0–40.0)

## 2014-12-30 LAB — VITAMIN D 1,25 DIHYDROXY
VITAMIN D 1, 25 (OH) TOTAL: 34 pg/mL (ref 18–72)
VITAMIN D3 1, 25 (OH): 34 pg/mL

## 2015-03-17 ENCOUNTER — Ambulatory Visit (INDEPENDENT_AMBULATORY_CARE_PROVIDER_SITE_OTHER): Payer: BLUE CROSS/BLUE SHIELD | Admitting: Family Medicine

## 2015-03-17 VITALS — BP 124/76 | HR 84 | Temp 98.2°F | Resp 18 | Ht 63.0 in | Wt 271.0 lb

## 2015-03-17 DIAGNOSIS — R0982 Postnasal drip: Secondary | ICD-10-CM | POA: Diagnosis not present

## 2015-03-17 DIAGNOSIS — J028 Acute pharyngitis due to other specified organisms: Secondary | ICD-10-CM

## 2015-03-17 DIAGNOSIS — H8111 Benign paroxysmal vertigo, right ear: Secondary | ICD-10-CM

## 2015-03-17 LAB — POCT RAPID STREP A (OFFICE): Rapid Strep A Screen: NEGATIVE

## 2015-03-17 MED ORDER — MECLIZINE HCL 25 MG PO TABS: 25.0000 mg | ORAL_TABLET | Freq: Three times a day (TID) | ORAL | Status: DC | PRN

## 2015-03-17 MED ORDER — IPRATROPIUM BROMIDE 0.03 % NA SOLN: 2.0000 | Freq: Four times a day (QID) | NASAL | Status: DC

## 2015-03-17 NOTE — Progress Notes (Signed)
[facility] [address] [place] [address] [phone]  Date:  03/17/2015   Name:  Samantha Smith   DOB:  [birth year]   MRN:  [id number]  PCP:  Kathlene November, MD    Chief Complaint: Dizziness and Sore Throat   History of Present Illness:  Samantha Smith is a 44 y.o. very pleasant female patient who presents with the following:  Here today to discuss a couple of concerns She has noted PND and a scratchy throat since yesterday.    Her left ear is also itchy No cough. She tried some sore throat spray and a saline spray for her nose No fever  She felt dizzy this am- it seemed to start a few minutes after she took her thyroid med so she was not sure if ti might be related.  However she has been on this medication for years and never had an issues in the past This am was the first time she has had this.   She is not sure if it is related to movement.  It passed in a few minutes No nausea or vomiting, no headache No vision or hearing change, no tinnitus  Patient Active Problem List   Diagnosis Date Noted  . Annual physical exam 12/27/2014  . Bilateral ankle fractures 06/13/2014  . Lisfranc's sprain 06/13/2014  . Transient hypotension 06/13/2014  . CA cervix 02/26/2013  . Skin lesion 11/10/2012  . Positive PPD   . Thyroid disease   . STD (sexually transmitted disease)   . Pseudotumor cerebri   . Morbid obesity   . LGSIL (low grade squamous intraepithelial dysplasia)   . Paresthesias 02/06/2011  . DEFICIENCY, VITAMIN D NOS 02/03/2007  . CND/STAT, MOTHER CMPLG PRG, BRTRIC SRG PP 02/03/2007  . PSTPRC STATUS, BARIATRIC SURGERY 02/03/2007    Past Medical History  Diagnosis Date  . Positive PPD     s/p abx per patient  . Thyroid disease   . STD (sexually transmitted disease)     Chlamydia history  . Blurred vision   . Morbid obesity   . ASCUS (atypical squamous cells of undetermined significance) on Pap smear 12/2012  . High risk HPV infection  12/2012  . 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 Pseudotumor cerebri   . CA cervix 02/26/2013    Past Surgical History  Procedure Laterality Date  . Cholecystectomy  12/01  . Right fallopian tube tied  2005    RIGHT  . Laparoscopic gastric banding  2008    see OV 06/09/08- ?Malfunction  . Ectopic pregnancy surgery      LEFT  . Left fallopian tube removed  2011  . Brain tumor excision    . Laparoscopic radical total hysterectomy w/ node biopsy, right salpingectomy  02/2013    [facility]  . Orif ankle fracture Left 06/16/2014    Procedure: OPEN REDUCTION INTERNAL FIXATION (ORIF) LEFT ANKLE, LISFRANC FRACTURE;  Surgeon: Marianna Payment, MD;  Location: [facility];  Service: Orthopedics;  Laterality: Left;  . Abdominal hysterectomy  2014    Social History  Substance Use Topics  . Smoking status: Never Smoker   . Smokeless tobacco: Never Used  . Alcohol Use: No    Family History  Problem Relation Age of Onset  . Hypertension Father   . Coronary artery disease Neg Hx   . Stroke Neg Hx   . Colon cancer Neg Hx   . Breast cancer  Mother 24    [name]  . Heart disease Sister   . Diabetes Maternal Grandmother     Allergies  Allergen Reactions  . Demeclocycline     Other reaction(s): Other (See Comments) Sutotuma?  . Tetracyclines & Related Other (See Comments)    Pseudo tumor  . Adhesive [Tape] Hives    Medication list has been reviewed and updated.  Current Outpatient Prescriptions on File Prior to Visit  Medication Sig Dispense Refill  . levothyroxine (SYNTHROID, LEVOTHROID) 75 MCG tablet Take 1 tablet (75 mcg total) by mouth daily before breakfast. 30 tablet 0   No current facility-administered medications on file prior to visit.    Review of Systems:  As per HPI- otherwise negative.   Physical Examination: Filed Vitals:   03/17/15 1211  BP: 124/76  Pulse: 84  Temp: 98.2 F (36.8 C)  Resp: 18   Filed Vitals:    03/17/15 1211  Height: 5\' 3"  (1.6 m)  Weight: 271 lb (122.925 kg)   Body mass index is 48.02 kg/(m^2). Ideal Body Weight: Weight in (lb) to have BMI = 25: 140.8  GEN: WDWN, NAD, Non-toxic, A & O x 3, obese, looks well HEENT: Atraumatic, Normocephalic. Neck supple. No masses, No LAD.  Bilateral TM wnl, oropharynx normal.  PEERL,EOMI.   Nasal cavity is inflamed and swollen Ears and Nose: No external deformity. CV: RRR, No M/G/R. No JVD. No thrill. No extra heart sounds. PULM: CTA B, no wheezes, crackles, rhonchi. No retractions. No resp. distress. No accessory muscle use. EXTR: No c/c/e NEURO Normal gait although she has had ankle surgery due to fracture. Normal strength, sensation and DTR all extremities, normal facial movement and sensation, normal romberg PSYCH: Normally interactive. Conversant. Not depressed or anxious appearing.  Calm demeanor.  Quite positive dix-halpike on the right. Sx resolved right away when she sat up again  Results for orders placed or performed in visit on 03/17/15  POCT rapid strep A  Result Value Ref Range   Rapid Strep A Screen Negative Negative    Assessment and Plan: Acute pharyngitis due to other specified organisms - Plan: POCT rapid strep A, ipratropium (ATROVENT) 0.03 % nasal spray  BPPV (benign paroxysmal positional vertigo), right - Plan: meclizine (ANTIVERT) 25 MG tablet  PND (post-nasal drip) - Plan: ipratropium (ATROVENT) 0.03 % nasal spray  Presumed BPPV.  Discussed with pt in detail, meclizine as needed' let us know if not better soon, right away if sx change or get worse atrovent nasal spray for [facility]  Signed Lamar Blinks, MD

## 2015-03-17 NOTE — Patient Instructions (Signed)
You do not have strep throat- I think that your sore throat is likely caused by drainage from your nose Try the atrovent nasal spray as needed for drainage- let me know if not better soon!  Your vertigo (dizziness) seems due to BPPV, a common and benign (not dangerous) form of vertigo Try the meclizine as needed for this, but it can make you feel sleepy.  Let me know if this is not better soon  Benign Positional Vertigo Vertigo means you feel like you or your surroundings are moving when they are not. Benign positional vertigo is the most common form of vertigo. Benign means that the cause of your condition is not serious. Benign positional vertigo is more common in older adults. CAUSES  Benign positional vertigo is the result of an upset in the labyrinth system. This is an area in the middle ear that helps control your balance. This may be caused by a viral infection, head injury, or repetitive motion. However, often no specific cause is found. SYMPTOMS  Symptoms of benign positional vertigo occur when you move your head or eyes in different directions. Some of the symptoms may include:  Loss of balance and falls.  Vomiting.  Blurred vision.  Dizziness.  Nausea.  Involuntary eye movements (nystagmus). DIAGNOSIS  Benign positional vertigo is usually diagnosed by physical exam. If the specific cause of your benign positional vertigo is unknown, your caregiver may perform imaging tests, such as magnetic resonance imaging (MRI) or computed tomography (CT). TREATMENT  Your caregiver may recommend movements or procedures to correct the benign positional vertigo. Medicines such as meclizine, benzodiazepines, and medicines for nausea may be used to treat your symptoms. In rare cases, if your symptoms are caused by certain conditions that affect the inner ear, you may need surgery. HOME CARE INSTRUCTIONS   Follow your caregiver's instructions.  Move slowly. Do not make sudden body or head  movements.  Avoid driving.  Avoid operating heavy machinery.  Avoid performing any tasks that would be dangerous to you or others during a vertigo episode.  Drink enough fluids to keep your urine clear or pale yellow. SEEK IMMEDIATE MEDICAL CARE IF:   You develop problems with walking, weakness, numbness, or using your arms, hands, or legs.  You have difficulty speaking.  You develop severe headaches.  Your nausea or vomiting continues or gets worse.  You develop visual changes.  Your family or friends notice any behavioral changes.  Your condition gets worse.  You have a fever.  You develop a stiff neck or sensitivity to light. MAKE SURE YOU:   Understand these instructions.  Will watch your condition.  Will get help right away if you are not doing well or get worse. Document Released: 04/16/2006 Document Revised: 10/01/2011 Document Reviewed: 03/29/2011 [facility] Patient Information 2015 [place], [place]. This information is not intended to replace advice given to you by your health care provider. Make sure you discuss any questions you have with your health care provider.

## 2015-03-23 ENCOUNTER — Encounter (HOSPITAL_BASED_OUTPATIENT_CLINIC_OR_DEPARTMENT_OTHER): Payer: Self-pay

## 2015-03-23 ENCOUNTER — Emergency Department (HOSPITAL_BASED_OUTPATIENT_CLINIC_OR_DEPARTMENT_OTHER)
Admission: EM | Admit: 2015-03-23 | Discharge: 2015-03-23 | Disposition: A | Discharge status: Home / Self Care | Payer: BLUE CROSS/BLUE SHIELD | Attending: Emergency Medicine | Admitting: Emergency Medicine

## 2015-03-23 DIAGNOSIS — J069 Acute upper respiratory infection, unspecified: Secondary | ICD-10-CM | POA: Diagnosis not present

## 2015-03-23 DIAGNOSIS — J40 Bronchitis, not specified as acute or chronic: Secondary | ICD-10-CM | POA: Insufficient documentation

## 2015-03-23 DIAGNOSIS — Z8619 Personal history of other infectious and parasitic diseases: Secondary | ICD-10-CM | POA: Diagnosis not present

## 2015-03-23 DIAGNOSIS — R0602 Shortness of breath: Secondary | ICD-10-CM | POA: Diagnosis present

## 2015-03-23 DIAGNOSIS — E079 Disorder of thyroid, unspecified: Secondary | ICD-10-CM | POA: Insufficient documentation

## 2015-03-23 DIAGNOSIS — Z8541 Personal history of malignant neoplasm of cervix uteri: Secondary | ICD-10-CM | POA: Diagnosis not present

## 2015-03-23 DIAGNOSIS — Z79899 Other long term (current) drug therapy: Secondary | ICD-10-CM | POA: Diagnosis not present

## 2015-03-23 DIAGNOSIS — Z8669 Personal history of other diseases of the nervous system and sense organs: Secondary | ICD-10-CM | POA: Diagnosis not present

## 2015-03-23 MED ORDER — IPRATROPIUM-ALBUTEROL 0.5-2.5 (3) MG/3ML IN SOLN
3.0000 mL | Freq: Once | RESPIRATORY_TRACT | Status: AC
  Administered 2015-03-23: 3 mL via RESPIRATORY_TRACT
  Filled 2015-03-23: qty 3

## 2015-03-23 MED ORDER — ALBUTEROL SULFATE HFA 108 (90 BASE) MCG/ACT IN AERS
2.0000 | INHALATION_SPRAY | Freq: Once | RESPIRATORY_TRACT | Status: AC
  Administered 2015-03-23: 2 via RESPIRATORY_TRACT
  Filled 2015-03-23: qty 6.7

## 2015-03-23 MED ORDER — DM-GUAIFENESIN ER 30-600 MG PO TB12: 1.0000 | ORAL_TABLET | Freq: Two times a day (BID) | ORAL | Status: DC

## 2015-03-23 NOTE — ED Notes (Signed)
C/o "feeling like it's hard to catch my breath" x 2-3 hourse-chest congestion, prod cough, sore throat since last week

## 2015-03-23 NOTE — ED Provider Notes (Signed)
CSN: [id number]     Arrival date & time 03/23/15  2108 History   This chart was scribed for Fredia Sorrow, MD by Forrestine Him, ED Scribe. This patient was seen in room MH08/MH08 and the patient's care was started 10:40 PM.   Chief Complaint  Patient presents with  . Shortness of Breath   The history is provided by the patient. No language interpreter was used.    HPI Comments: Samantha Smith is a 44 y.o. female who presents to the Emergency Department complaining of constant, ongoing shortness of breath x 2-3 hours; worsened in last few hours. No aggravating or alleviating factors. She also reports chest congestion, productive cough, and sore throat onset last week. OTC medications attempted prior to arrival without any improvement for symptoms. Pt picked up her Atrovent nasal spray from the pharmacy earlier this afternoon but did not get a chance to use it prior to arrival. No recent fever, chills, or wheezing. Pt with known allergies to Demeclocycline and Tetracycline.  PCP: Kathlene November, MD   Past Medical History  Diagnosis Date  . Positive PPD     s/p abx per patient  . Thyroid disease   . STD (sexually transmitted disease)     Chlamydia history  . Blurred vision   . Morbid obesity   . ASCUS (atypical squamous cells of undetermined significance) on Pap smear 12/2012  . 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 Pseudotumor cerebri   . CA cervix 02/26/2013   Past Surgical History  Procedure Laterality Date  . Cholecystectomy  12/01  . Right fallopian tube tied  2005    RIGHT  . Laparoscopic gastric banding  2008    see OV 06/09/08- ?Malfunction  . Ectopic pregnancy surgery      LEFT  . Left fallopian tube removed  2011  . Brain tumor excision    . Laparoscopic radical total hysterectomy w/ node biopsy, right salpingectomy  02/2013    [facility]  . Orif ankle fracture Left 06/16/2014    Procedure: OPEN REDUCTION  INTERNAL FIXATION (ORIF) LEFT ANKLE, LISFRANC FRACTURE;  Surgeon: Marianna Payment, MD;  Location: [place];  Service: Orthopedics;  Laterality: Left;  . Abdominal hysterectomy  2014   Family History  Problem Relation Age of Onset  . Hypertension Father   . Coronary artery disease Neg Hx   . Stroke Neg Hx   . Colon cancer Neg Hx   . Breast cancer Mother 60    [name]  . Heart disease Sister   . Diabetes Maternal Grandmother    Social History  Substance Use Topics  . Smoking status: Never Smoker   . Smokeless tobacco: Never Used  . Alcohol Use: No   OB History    Gravida Para Term Preterm AB TAB SAB Ectopic Multiple Living   3    3   1   0     Review of Systems  Constitutional: Negative for fever and chills.  HENT: Positive for congestion and sore throat. Negative for rhinorrhea.   Eyes: Negative for visual disturbance.  Respiratory: Positive for cough and shortness of breath. Negative for wheezing.   Cardiovascular: Negative for chest pain and leg swelling.  Gastrointestinal: Positive for nausea. Negative for vomiting, abdominal pain and diarrhea.  Genitourinary: Negative for dysuria.  Musculoskeletal: Positive for myalgias. Negative for back pain, joint swelling and neck pain.  Skin: Negative for  rash.  Neurological: Negative for dizziness, light-headedness and headaches.  Hematological: Does not bruise/bleed easily.  Psychiatric/Behavioral: Negative for confusion.      Allergies  Demeclocycline; Tetracyclines & related; and Adhesive  Home Medications   Prior to Admission medications   Medication Sig Start Date End Date Taking? Authorizing Provider  dextromethorphan-guaiFENesin (MUCINEX DM) 30-600 MG per 12 hr tablet Take 1 tablet by mouth 2 (two) times daily. 03/23/15   Fredia Sorrow, MD  ipratropium (ATROVENT) 0.03 % nasal spray Place 2 sprays into the nose 4 (four) times daily. 03/17/15   Darreld Mclean, MD  levothyroxine (SYNTHROID,  LEVOTHROID) 75 MCG tablet Take 1 tablet (75 mcg total) by mouth daily before breakfast. 12/27/14   Colon Branch, MD   Triage Vitals: BP 134/62 mmHg  Pulse 76  Temp(Src) 98.4 F (36.9 C) (Oral)  Resp 18  Ht 5\' 3"  (1.6 m)  Wt 270 lb (122.471 kg)  BMI 47.84 kg/m2  SpO2 98%  LMP 01/02/2013   Physical Exam  Constitutional: She is oriented to person, place, and time. She appears well-developed and well-nourished. No distress.  HENT:  Head: Normocephalic and atraumatic.  Mouth/Throat: Oropharynx is clear and moist. No oropharyngeal exudate.  White coating on the tongue  Uvula midline   Eyes: Pupils are equal, round, and reactive to light.  Eyes track normal Sclera clear   Cardiovascular: Normal rate, regular rhythm and normal heart sounds.   Pulmonary/Chest: Effort normal and breath sounds normal. She has no wheezes.  Abdominal: Soft. Bowel sounds are normal. There is no tenderness.  Musculoskeletal: Normal range of motion. She exhibits no edema or tenderness.  No swelling in ankles bilaterally   Neurological: She is alert and oriented to person, place, and time. No cranial nerve deficit. She exhibits normal muscle tone. Coordination normal.  Skin: Skin is warm and dry. She is not diaphoretic.  Psychiatric: She has a normal mood and affect. Her behavior is normal. Thought content normal.    ED Course  Procedures (including critical care time)  DIAGNOSTIC STUDIES: Oxygen Saturation is 98% on RA, Normal by my interpretation.    COORDINATION OF CARE: 10:47 PM- Will give breathing treatment. Discussed treatment plan with pt at bedside and pt agreed to plan.     Labs Review Labs Reviewed - No data to display  Imaging Review No results found. I have personally reviewed and evaluated these images and lab results as part of my medical decision-making.   EKG Interpretation None      MDM   Final diagnoses:  URI (upper respiratory infection)  Bronchitis     patient symptoms  consistent with upper rest for infection with bronchitis. Patient as per respiratory therapy did not have wheezing but had rhonchi. Patient improved with the albuterol inhaler here. Will be discharged home with that. Also treated with Mucinex DM. Other symptoms like stated are consistent with a viral upper respiratory infection. Patient nontoxic no acute distress no hypoxia no significant fever no clinical concerns for pneumonia.   I personally performed the services described in this documentation, which was scribed in my presence. The recorded information has been reviewed and is accurate.       Fredia Sorrow, MD 03/23/15 [phone]

## 2015-03-23 NOTE — Discharge Instructions (Signed)
Usual albuterol inhaler 2 puffs every 6 hours for the next week. Mucinex DM as needed for cough and loosening of the phlegm. Over-the-counter cold and cough if you want to treat the congestion. But some of them will have Mucinex in them or will have the dextromethorphan in them like to Mucinex so you cannot take both together. Return for any new or worse symptoms.

## 2015-03-29 ENCOUNTER — Other Ambulatory Visit: Payer: BLUE CROSS/BLUE SHIELD

## 2015-04-06 ENCOUNTER — Encounter: Payer: Self-pay | Admitting: Internal Medicine

## 2015-04-06 ENCOUNTER — Ambulatory Visit (INDEPENDENT_AMBULATORY_CARE_PROVIDER_SITE_OTHER): Payer: BLUE CROSS/BLUE SHIELD | Admitting: Internal Medicine

## 2015-04-06 VITALS — BP 124/70 | HR 75 | Temp 97.9°F | Ht 63.0 in | Wt 272.4 lb

## 2015-04-06 DIAGNOSIS — E039 Hypothyroidism, unspecified: Secondary | ICD-10-CM

## 2015-04-06 DIAGNOSIS — J209 Acute bronchitis, unspecified: Secondary | ICD-10-CM

## 2015-04-06 DIAGNOSIS — Z09 Encounter for follow-up examination after completed treatment for conditions other than malignant neoplasm: Secondary | ICD-10-CM | POA: Insufficient documentation

## 2015-04-06 DIAGNOSIS — Z114 Encounter for screening for human immunodeficiency virus [HIV]: Secondary | ICD-10-CM

## 2015-04-06 MED ORDER — AZITHROMYCIN 250 MG PO TABS: ORAL_TABLET | ORAL | Status: DC

## 2015-04-06 MED ORDER — BUDESONIDE-FORMOTEROL FUMARATE 160-4.5 MCG/ACT IN AERO: 2.0000 | INHALATION_SPRAY | Freq: Two times a day (BID) | RESPIRATORY_TRACT | Status: DC

## 2015-04-06 NOTE — Progress Notes (Signed)
Subjective:    Patient ID: Samantha Smith, female    DOB: [birth year], 44 y.o.   MRN: [id number]  DOS:  04/06/2015 Type of visit - description : ER follow-up Interval history: ER visit 03-23-15: Was seen with bronchitis, note reviewed,received albuterol treatment, she developed significant relief. Since then has been using her inhaler but she does not know if she is using it correctly. She continue with episodes of cough, shortness of breath, sx increase by talking, sometimes worse when she lays down.  Hypothyroidism: Due for labs  Review of Systems  No fever chills No sinus pain or congestion, did have some postnasal dripping. No chest pain. No sputum production + Wheezing, mostly at night. Denies lower extremity edema.  Past Medical History  Diagnosis Date  . Positive PPD     s/p abx per patient  . Thyroid disease   . STD (sexually transmitted disease)     Chlamydia history  . Blurred vision   . Morbid obesity   . ASCUS (atypical squamous cells of undetermined significance) on Pap smear 12/2012  . 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 Pseudotumor cerebri   . CA cervix 02/26/2013    Past Surgical History  Procedure Laterality Date  . Cholecystectomy  12/01  . Right fallopian tube tied  2005    RIGHT  . Laparoscopic gastric banding  2008    see OV 06/09/08- ?Malfunction  . Ectopic pregnancy surgery      LEFT  . Left fallopian tube removed  2011  . Brain tumor excision    . Laparoscopic radical total hysterectomy w/ node biopsy, right salpingectomy  02/2013    [facility]  . Orif ankle fracture Left 06/16/2014    Procedure: OPEN REDUCTION INTERNAL FIXATION (ORIF) LEFT ANKLE, LISFRANC FRACTURE;  Surgeon: Marianna Payment, MD;  Location: [place];  Service: Orthopedics;  Laterality: Left;  . Abdominal hysterectomy  2014    Social History   Social History  . Marital Status: Married    Spouse Name: N/A    . Number of Children: 0  . Years of Education: N/A   Occupational History  . Accountant      Social History Main Topics  . Smoking status: Never Smoker   . Smokeless tobacco: Never Used  . Alcohol Use: No  . Drug Use: No  . Sexual Activity: Not on file   Other Topics Concern  . Not on file   Social History Narrative        Medication List       This list is accurate as of: 04/06/15  7:00 PM.  Always use your most recent med list.               azithromycin 250 MG tablet  Commonly known as:  ZITHROMAX Z-PAK  2 tabs a day the first day, then 1 tab a day x 4 days     budesonide-formoterol 160-4.5 MCG/ACT inhaler  Commonly known as:  SYMBICORT  Inhale 2 puffs into the lungs 2 (two) times daily.     levothyroxine 75 MCG tablet  Commonly known as:  SYNTHROID, LEVOTHROID  Take 1 tablet (75 mcg total) by mouth daily before breakfast.           Objective:   Physical Exam BP 124/70 mmHg  Pulse 75  Temp(Src) 97.9 F (36.6 C) (Oral)  Ht 5\' 3"  (1.6 m)  Wt 272  lb 6 oz (123.548 kg)  BMI 48.26 kg/m2  SpO2 96%  LMP 01/02/2013 General:   Well developed, well nourished . NAD.  HEENT:  Normocephalic . Face symmetric, atraumatic. TMs normal, nose is slightly congested, sinuses not TTP Lungs:  Clear lungs, slightly increased expiratory time Normal respiratory effort, no intercostal retractions, no accessory muscle use. Heart: RRR,  no murmur.  No pretibial edema bilaterally  Skin: Not pale. Not jaundice Neurologic:  alert & oriented X3.  Speech normal, gait appropriate for age and unassisted Psych--  Cognition and judgment appear intact.  Cooperative with normal attention span and concentration.  Behavior appropriate. No anxious or depressed appearing.      Assessment & Plan:   Problem list > Hypothyroid + PPD, s/p antibiotics Cervical cancer, laparoscopic radical total hysterectomy 02-2013 H/o STDs, HPV + Several tumorPseudotumor Cerebri Obesity --  gastric banding ~ 2006  A/P Bronchospasm, bronchitis: History suggestive of bronchitis and bronchospasm. No history of asthma, patient is a nonsmoker. Will treat with Symbicort for a month, Z-Pak, Mucinex, Flonase. See AVS Hypothyroidism: Good compliance of medication, due for labs, request a free T3 and free T4 in addition to TSH.

## 2015-04-06 NOTE — Progress Notes (Signed)
Pre visit review using our clinic review tool, if applicable. No additional management support is needed unless otherwise documented below in the visit note. 

## 2015-04-06 NOTE — Patient Instructions (Signed)
Get your labs before you leave  -------  Rest, fluids , tylenol  For cough: Take Mucinex DM twice a day as needed until better  For nasal congestion Use OTC Nasocort or Flonase : 2 nasal sprays on each side of the nose daily until you feel better  Use Symbicort 2 puffs twice a day for one month   Take the antibiotic as prescribed  (zithromax )  Call if not gradually better over the next  10 days  Call anytime if the symptoms are severe   Next visit by February 2017 for a checkup, 15 minutes.

## 2015-04-06 NOTE — Assessment & Plan Note (Signed)
Bronchospasm, bronchitis: History suggestive of bronchitis and bronchospasm. No history of asthma, patient is a nonsmoker. Will treat with Symbicort for a month, Z-Pak, Mucinex, Flonase. See AVS Hypothyroidism: Good compliance of medication, due for labs, request a free T3 and free T4 in addition to TSH.

## 2015-04-07 LAB — HIV ANTIBODY (ROUTINE TESTING W REFLEX): HIV 1&2 Ab, 4th Generation: NONREACTIVE

## 2015-04-07 LAB — TSH: TSH: 3.24 u[IU]/mL (ref 0.35–4.50)

## 2015-04-07 LAB — T3, FREE: T3, Free: 3.1 pg/mL (ref 2.3–4.2)

## 2015-04-07 LAB — T4, FREE: FREE T4: 1.11 ng/dL (ref 0.60–1.60)

## 2015-04-11 MED ORDER — LEVOTHYROXINE SODIUM 100 MCG PO TABS: 100.0000 ug | ORAL_TABLET | Freq: Every day | ORAL | Status: DC

## 2015-04-11 NOTE — Addendum Note (Signed)
Addended by: Wilfrid Lund on: 04/11/2015 09:47 AM   Modules accepted: Orders, Medications

## 2015-05-23 ENCOUNTER — Other Ambulatory Visit: Payer: Self-pay

## 2015-05-23 DIAGNOSIS — Z1231 Encounter for screening mammogram for malignant neoplasm of breast: Secondary | ICD-10-CM

## 2015-05-25 ENCOUNTER — Ambulatory Visit (INDEPENDENT_AMBULATORY_CARE_PROVIDER_SITE_OTHER): Payer: BLUE CROSS/BLUE SHIELD | Admitting: Gynecology

## 2015-05-25 ENCOUNTER — Encounter: Payer: Self-pay | Admitting: Gynecology

## 2015-05-25 ENCOUNTER — Telehealth: Payer: Self-pay | Admitting: *Deleted

## 2015-05-25 VITALS — BP 124/80

## 2015-05-25 DIAGNOSIS — N644 Mastodynia: Secondary | ICD-10-CM | POA: Diagnosis not present

## 2015-05-25 DIAGNOSIS — N63 Unspecified lump in unspecified breast: Secondary | ICD-10-CM

## 2015-05-25 NOTE — Progress Notes (Signed)
Samantha Smith 1971-06-07 [id number]        44 y.o.  G3P0030 presents with three-month history of intermittent mastalgia. Patient describes around her right nipple become sore on and off each month which seems to correlate around the time of her expected ovulation. Also notes in her left breast approximately 11:00 periphery tenderness with increased density again at the time of ovulation but then resolves afterwards. No persistent masses. No nipple discharges or other abnormalities.  Past medical history,surgical history, problem list, medications, allergies, family history and social history were all reviewed and documented in the EPIC chart.  Directed ROS with pertinent positives and negatives documented in the history of present illness/assessment and plan.  Exam: [name] assistant Filed Vitals:   05/25/15 0817  BP: 124/80   General appearance:  Normal Both breast examined lying and sitting without masses retractions discharge adenopathy  Assessment/Plan:  44 y.o. G3P0030 with intermittent mastalgia which I think is hormonally related. Patient coming due for mammogram and will go ahead and do a diagnostic mammogram with ultrasound over both of these tender areas. Assuming negative then we'll plan expected management and she will continue to follow and return if any persistent palpable abnormalities or new findings.    Anastasio Auerbach MD, 8:31 AM 05/25/2015

## 2015-05-25 NOTE — Telephone Encounter (Signed)
-----   Message from Anastasio Auerbach, MD sent at 05/25/2015  8:33 AM EDT ----- Schedule bilateral diagnostic mammography and bilateral ultrasound reference intermittent tenderness right breast areolar area and tender area left breast with intermittent palpable mass 11:00 periphery of the breast.

## 2015-05-25 NOTE — Telephone Encounter (Signed)
Orders placed,Appointment on 05/30/15 @ 1:40pm at breast center pt aware.

## 2015-05-25 NOTE — Patient Instructions (Signed)
Office will contact you to arrange the diagnostic mammography and ultrasound.  If you do not hear from the office within the next week call.

## 2015-05-30 ENCOUNTER — Ambulatory Visit
Admission: RE | Admit: 2015-05-30 | Discharge: 2015-05-30 | Disposition: A | Discharge status: Home / Self Care | Payer: BLUE CROSS/BLUE SHIELD | Source: Ambulatory Visit | Attending: Gynecology | Admitting: Gynecology

## 2015-05-30 DIAGNOSIS — N644 Mastodynia: Secondary | ICD-10-CM

## 2015-05-30 DIAGNOSIS — N63 Unspecified lump in unspecified breast: Secondary | ICD-10-CM

## 2015-05-31 ENCOUNTER — Telehealth: Payer: Self-pay

## 2015-05-31 NOTE — Telephone Encounter (Signed)
-----   Message from Samantha Auerbach, MD sent at 05/31/2015 10:01 AM EST ----- Tell patient her mammogram and ultrasound were negative. I think that her discomfort is hormonally related. I would just watch it for now. If she feels any distinct masses or any other issues to follow up for reexamination.

## 2015-05-31 NOTE — Telephone Encounter (Signed)
Unfortunately there is not a lot that can be done. Sometimes we will put patients on low-dose oral contraceptives to suppress ovarian hormone production. I'm not sure it would be worth her taking a pill every day and have the additional risk of birth control pills to include blood clots like stroke heart attack deep venous thromboses. We sometimes will treat patients with androgen like medications such as danazol but this comes with the unwanted side effects of possible weight gain acne and hair growth. I think for right now I would just try to put up with it and hopefully things will straighten itself out.

## 2015-05-31 NOTE — Telephone Encounter (Signed)
Patient asked is there anything she can take to help the hormones so that they don't cause her breast discomfort?

## 2015-05-31 NOTE — Telephone Encounter (Signed)
Left message to call.

## 2015-06-23 ENCOUNTER — Ambulatory Visit: Payer: BLUE CROSS/BLUE SHIELD

## 2015-06-25 IMAGING — CT CT HEAD W/O CM
2 series · 16 of 30 positions shown, 20 images · non-contrast
Comparison: None.

CLINICAL DATA: Headache at the crown of the head for 3 days.
Dizziness and blurred vision. History of pseudotumor.

EXAM:
CT HEAD WITHOUT CONTRAST
TECHNIQUE: Contiguous axial images were obtained from the base of the skull
through the vertex without intravenous contrast.

[Series 2: head w/o · axial · non-contrast · 0.45mm/px · z∈[+1351,+1471]mm · 13 of 30 slices shown, 17 images]
[im 3/30  brain]
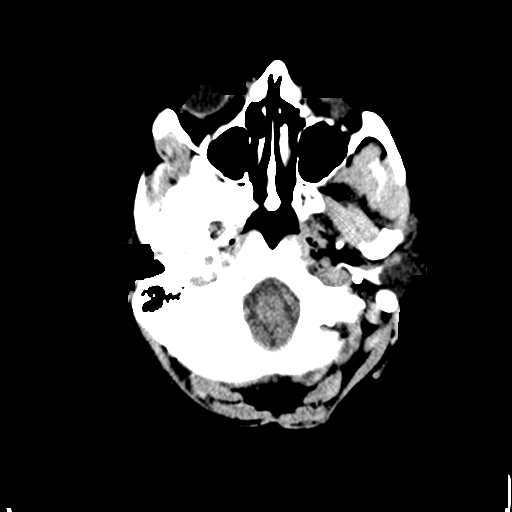
[im 3/30  bone]
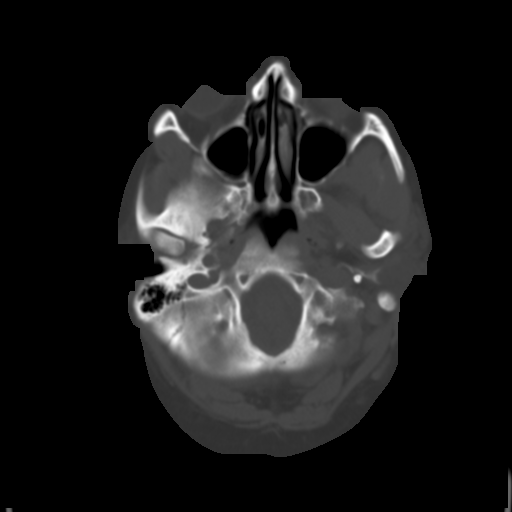
[im 5/30  brain]
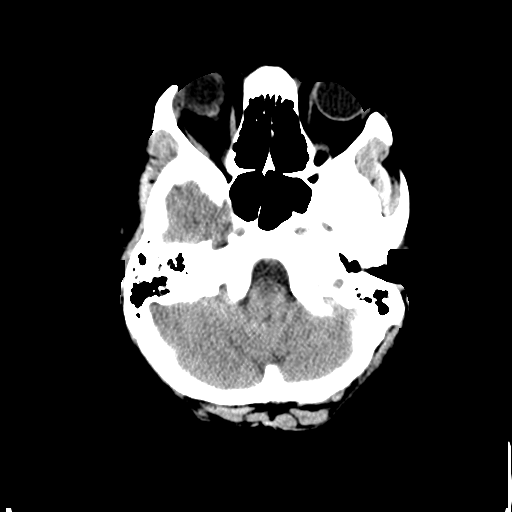
[im 7/30  brain]
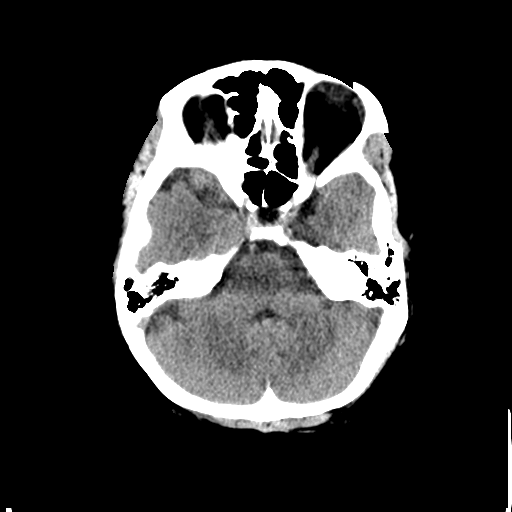
[im 9/30  brain]
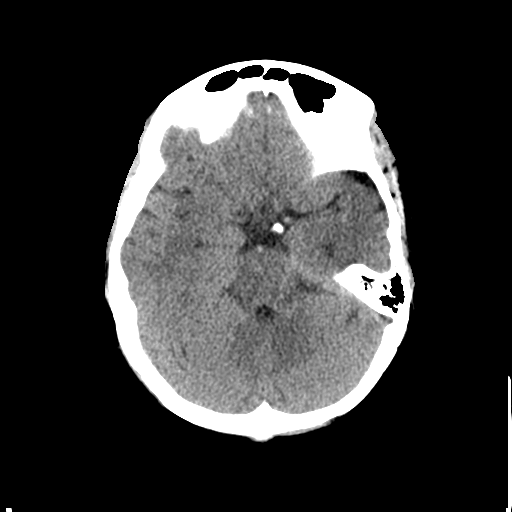
[im 11/30  brain]
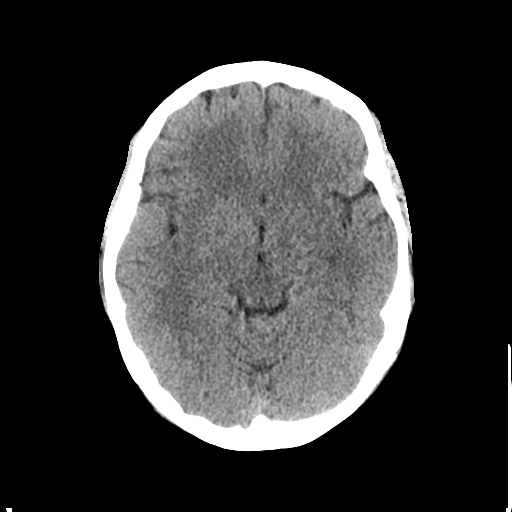
[im 11/30  bone]
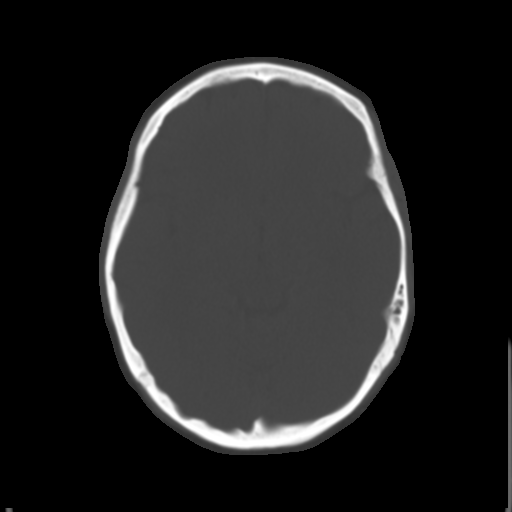
[im 13/30  brain]
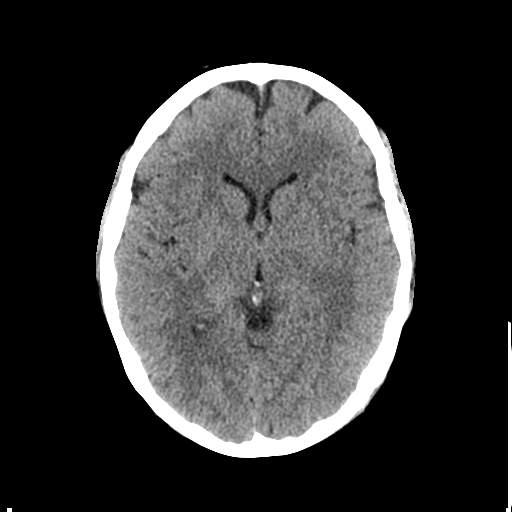
[im 15/30  brain]
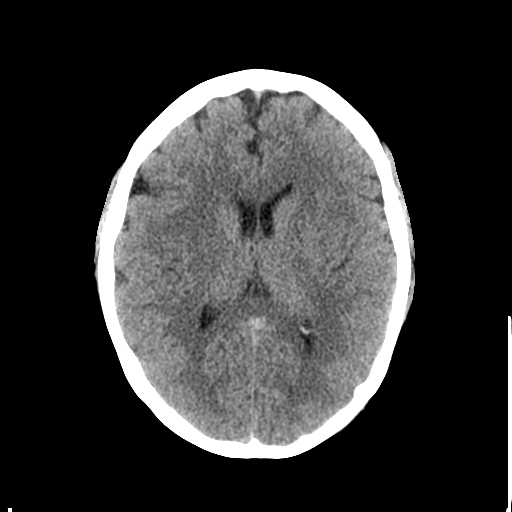
[im 17/30  brain]
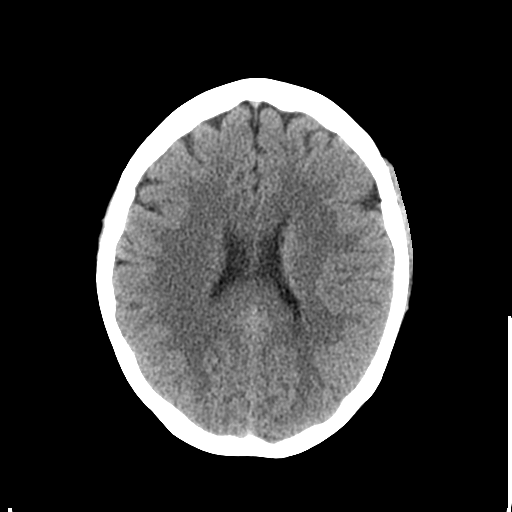
[im 19/30  brain]
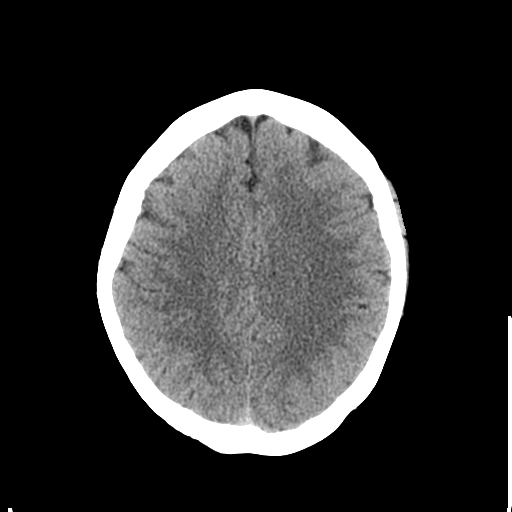
[im 19/30  bone]
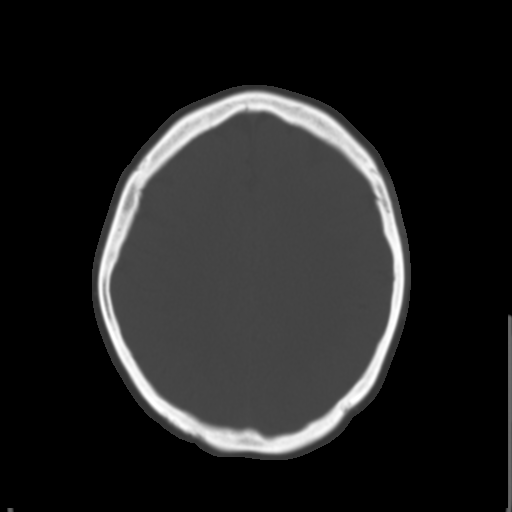
[im 21/30  brain]
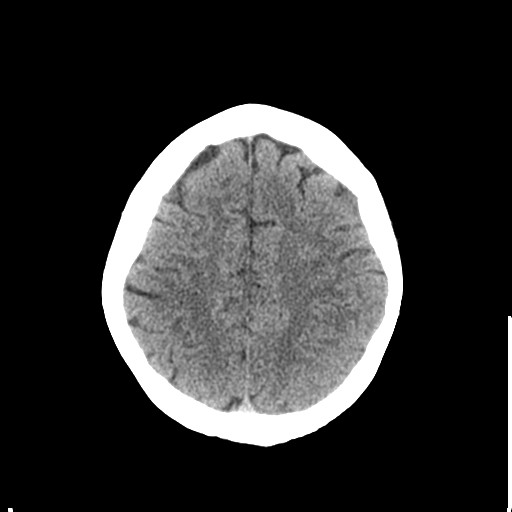
[im 23/30  brain]
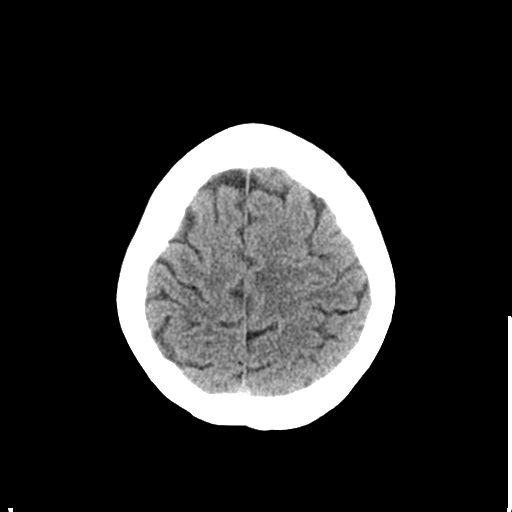
[im 25/30  brain]
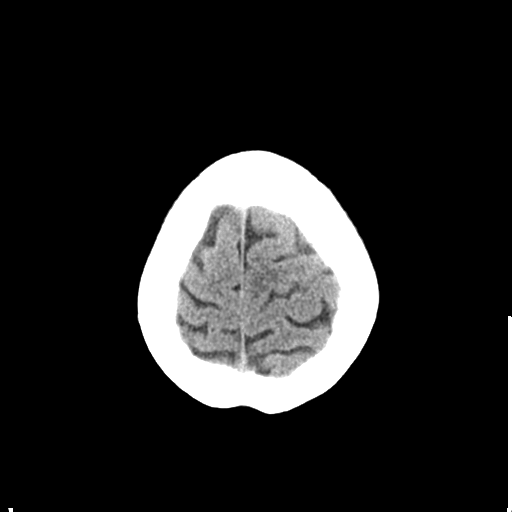
[im 27/30  brain]
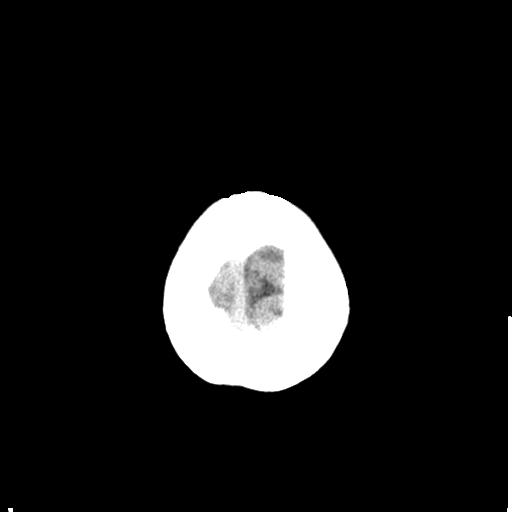
[im 27/30  bone]
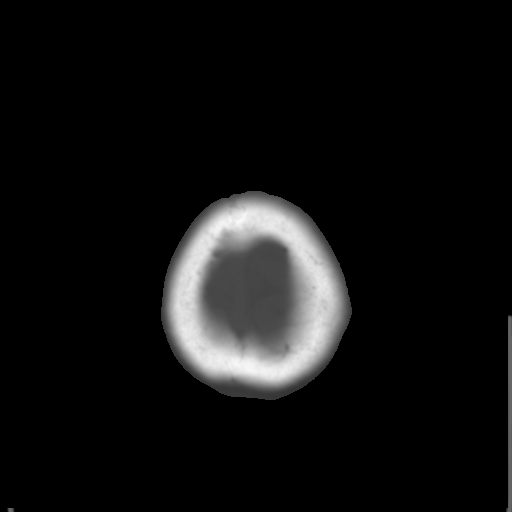

[Series 3: bone windows · axial · 0.45mm/px · z∈[+1351,+1391]mm · 3 of 30 slices shown]
[im 3/30  bone]
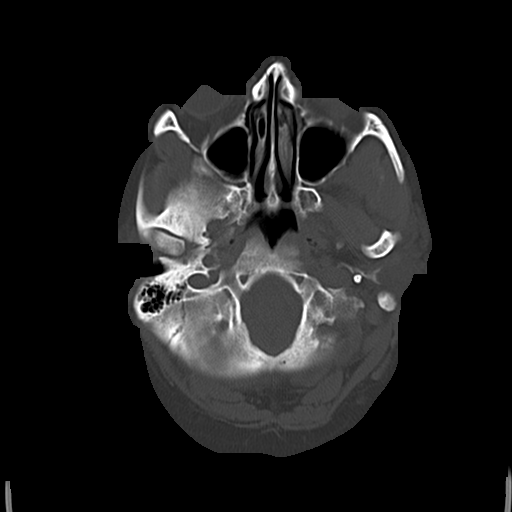
[im 7/30  bone]
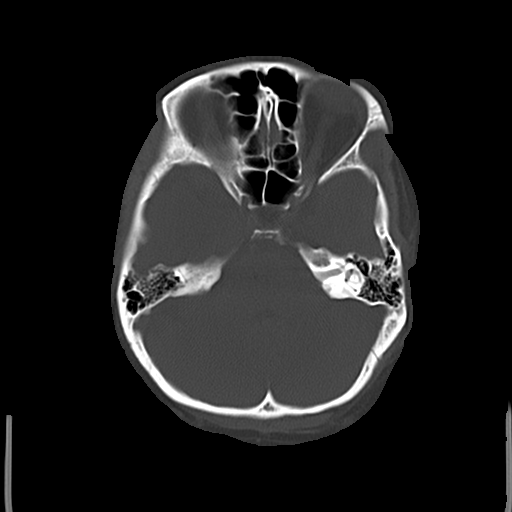
[im 11/30  bone]
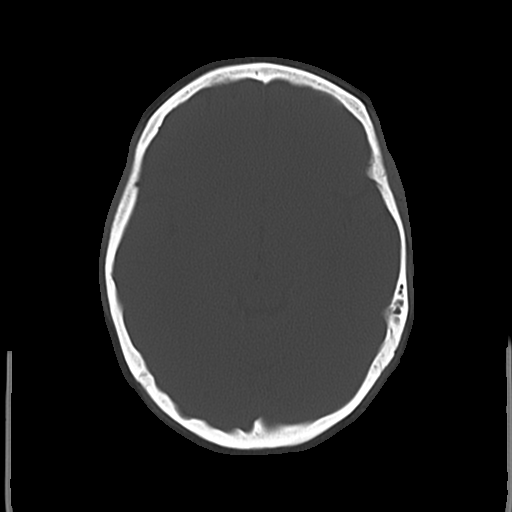

[16 of 30 positions shown; findings below may reference images not displayed]

FINDINGS: Ventricles and sulci appear symmetrical. No ventricular dilatation.
No mass effect or midline shift. No abnormal extra-axial fluid
collections. Gray-white matter junctions are distinct. Basal
cisterns are not effaced. No evidence of acute intracranial
hemorrhage. No depressed skull fractures. Visualized paranasal
sinuses and mastoid air cells are not opacified. Visualized orbits
appear normal and symmetrical.
IMPRESSION: No acute intracranial abnormalities.

## 2015-08-21 ENCOUNTER — Other Ambulatory Visit: Payer: Self-pay | Admitting: Internal Medicine

## 2015-08-24 ENCOUNTER — Telehealth: Payer: Self-pay | Admitting: Internal Medicine

## 2015-08-24 ENCOUNTER — Other Ambulatory Visit: Payer: Self-pay | Admitting: Gynecology

## 2015-08-24 NOTE — Telephone Encounter (Signed)
Received electronic refill request on 08/21/2015, which I denied, Pt was supposed to return in 04/2015 for TSH recheck. Needs labs before refills.

## 2015-08-24 NOTE — Telephone Encounter (Signed)
Patient scheduled for 08/25/15 for labs only

## 2015-08-24 NOTE — Telephone Encounter (Signed)
Relation to [id number] Call back number:[phone] Pharmacy: [facility] - [place], [place] - [address] [phone] ([phone] (Fax)         Reason for call:  Patient requesting a refill levothyroxine (SYNTHROID, LEVOTHROID) 100 MCG tablet

## 2015-08-25 ENCOUNTER — Other Ambulatory Visit (INDEPENDENT_AMBULATORY_CARE_PROVIDER_SITE_OTHER): Payer: BLUE CROSS/BLUE SHIELD

## 2015-08-25 DIAGNOSIS — E079 Disorder of thyroid, unspecified: Secondary | ICD-10-CM

## 2015-08-25 LAB — TSH: TSH: 1.9 u[IU]/mL (ref 0.35–4.50)

## 2015-08-25 LAB — T3, FREE: T3, Free: 3 pg/mL (ref 2.3–4.2)

## 2015-08-25 LAB — T4, FREE: FREE T4: 1.05 ng/dL (ref 0.60–1.60)

## 2015-09-02 ENCOUNTER — Other Ambulatory Visit: Payer: Self-pay

## 2015-09-02 ENCOUNTER — Telehealth: Payer: Self-pay | Admitting: Internal Medicine

## 2015-09-02 MED ORDER — LEVOTHYROXINE SODIUM 100 MCG PO TABS: 100.0000 ug | ORAL_TABLET | Freq: Every day | ORAL | Status: DC

## 2015-09-02 NOTE — Telephone Encounter (Signed)
Caller name: Self  Can be reached: [phone]   Reason for call: Patient called stating that she has not received a call about the medications that she take. States that Dr. Larose Kells was checking to see if he had given her the correct medication.

## 2015-09-02 NOTE — Telephone Encounter (Signed)
Spoke w/ Pt, informed her that we released her results to [facility] on 08/27/2015 and TSH was well balanced to continue same dosage of Synthroid. Pt verbalized understanding. Synthroid Rx sent to [facility] as requested.

## 2015-09-02 NOTE — Telephone Encounter (Signed)
Notes Recorded by Colon Branch, MD on 08/27/2015 at 10:05 AM Releasing results to [facility] with comments: Tanara, we recently increased a little your Synthroid dose, the TSH looks very good. Continue taking the same dose of your thyroid medicine, please call the office if you need a refill.

## 2015-09-12 ENCOUNTER — Ambulatory Visit: Payer: BLUE CROSS/BLUE SHIELD | Admitting: Internal Medicine

## 2015-09-12 ENCOUNTER — Encounter: Payer: Self-pay | Admitting: Internal Medicine

## 2015-09-12 ENCOUNTER — Ambulatory Visit (INDEPENDENT_AMBULATORY_CARE_PROVIDER_SITE_OTHER): Payer: BLUE CROSS/BLUE SHIELD | Admitting: Internal Medicine

## 2015-09-12 VITALS — BP 118/74 | HR 98 | Temp 98.2°F | Ht 63.0 in | Wt 276.2 lb

## 2015-09-12 DIAGNOSIS — R05 Cough: Secondary | ICD-10-CM | POA: Diagnosis not present

## 2015-09-12 DIAGNOSIS — R112 Nausea with vomiting, unspecified: Secondary | ICD-10-CM

## 2015-09-12 DIAGNOSIS — R059 Cough, unspecified: Secondary | ICD-10-CM

## 2015-09-12 DIAGNOSIS — B349 Viral infection, unspecified: Secondary | ICD-10-CM

## 2015-09-12 LAB — POCT INFLUENZA A/B
INFLUENZA A, POC: NEGATIVE
INFLUENZA B, POC: NEGATIVE

## 2015-09-12 MED ORDER — OSELTAMIVIR PHOSPHATE 75 MG PO CAPS: 75.0000 mg | ORAL_CAPSULE | Freq: Two times a day (BID) | ORAL | Status: DC

## 2015-09-12 MED ORDER — HYDROCODONE-HOMATROPINE 5-1.5 MG/5ML PO SYRP: 5.0000 mL | ORAL_SOLUTION | Freq: Four times a day (QID) | ORAL | Status: DC | PRN

## 2015-09-12 NOTE — Patient Instructions (Signed)
Rest, fluids , tylenol  For cough:  Take Mucinex DM twice a day as needed until better If the cough continue, use hydrocodone, will cause drowsiness   For nasal congestion: Use OTC Nasocort or Flonase : 2 nasal sprays on each side of the nose in the morning until you feel better   Take Tamiflu for 5 days Call if not gradually better over the next  10 days  Call anytime if the symptoms are severe, you have high fever, short of breath, chest pain; also if you get better and then symptoms returned.

## 2015-09-12 NOTE — Progress Notes (Signed)
Pre visit review using our clinic review tool, if applicable. No additional management support is needed unless otherwise documented below in the visit note. 

## 2015-09-12 NOTE — Progress Notes (Signed)
Subjective:    Patient ID: Samantha Smith, female    DOB: 24-Nov-1970, 45 y.o.   MRN: [id number]  DOS:  09/12/2015 Type of visit - description : Acute visit Interval history: 2 weeks ago, developed a runny nose and some sinus congestion but reports something else is going on for the last 2 days: Weak, nausea, vomiting, aches, chest congestion, increased cough. Not taking any meds w/ her sx. Husband unaffected   Review of Systems Mild subjective fever on and off No chest pain or difficulty breathing No rash Mild headache  Past Medical History  Diagnosis Date  . Positive PPD     s/p abx per patient  . Thyroid disease   . STD (sexually transmitted disease)     Chlamydia history  . Blurred vision   . Morbid obesity ([place])   . ASCUS (atypical squamous cells of undetermined significance) on Pap smear 12/2012  . 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 Cervical cancer ([place]) 02/2013    Stage IB1 poorly differentiated squamous cell  . Pseudotumor cerebri   . CA cervix ([place]) 02/26/2013    Past Surgical History  Procedure Laterality Date  . Cholecystectomy  12/01  . Right fallopian tube tied  2005    RIGHT  . Laparoscopic gastric banding  2008    see OV 06/09/08- ?Malfunction  . Ectopic pregnancy surgery      LEFT  . Left fallopian tube removed  2011  . Brain tumor excision    . Laparoscopic radical total hysterectomy w/ node biopsy, right salpingectomy  02/2013    [facility]  . Orif ankle fracture Left 06/16/2014    Procedure: OPEN REDUCTION INTERNAL FIXATION (ORIF) LEFT ANKLE, LISFRANC FRACTURE;  Surgeon: Marianna Payment, MD;  Location: [facility];  Service: Orthopedics;  Laterality: Left;    Social History   Social History  . Marital Status: Married    Spouse Name: N/A  . Number of Children: 0  . Years of Education: N/A   Occupational History  . Accountant      Social History Main Topics  . Smoking status: Never Smoker   . Smokeless tobacco: Never Used  . Alcohol Use: No  . Drug  Use: No  . Sexual Activity: Not on file   Other Topics Concern  . Not on file   Social History Narrative        Medication List       This list is accurate as of: 09/12/15  9:01 PM.  Always use your most recent med list.               HYDROcodone-homatropine 5-1.5 MG/5ML syrup  Commonly known as:  HYCODAN  Take 5 mLs by mouth every 6 (six) hours as needed for cough.     levothyroxine 100 MCG tablet  Commonly known as:  SYNTHROID, LEVOTHROID  Take 1 tablet (100 mcg total) by mouth daily before breakfast.     oseltamivir 75 MG capsule  Commonly known as:  TAMIFLU  Take 1 capsule (75 mg total) by mouth 2 (two) times daily.           Objective:   Physical Exam BP 118/74 mmHg  Pulse 98  Temp(Src) 98.2 F (36.8 C) (Oral)  Ht 5\' 3"  (1.6 m)  Wt 276 lb 4 oz (125.306 kg)  BMI 48.95 kg/m2  SpO2 96%  LMP 01/02/2013 General:   Well developed, well nourished, nontoxic appearing HEENT:  Normocephalic . Face symmetric, atraumatic. Nose quite congested,  sinuses not TTP, TMs normal. Throat symmetric, no redness Lungs:  Few rhonchi otherwise clear Normal respiratory effort, no intercostal retractions, no accessory muscle use. Heart: RRR,  no murmur.  no pretibial edema bilaterally  Abdomen:  Not distended, soft, non-tender. No rebound or rigidity.  Skin: Not pale. Not jaundice Neurologic:  alert & oriented X3.  Speech normal, gait appropriate for age and unassisted Psych--  Cognition and judgment appear intact.  Cooperative with normal attention span and concentration.  Behavior appropriate. No anxious or depressed appearing.    Assessment & Plan:   Assessment > Hypothyroid + PPD, s/p antibiotics Cervical cancer, laparoscopic radical total hysterectomy 02-2013 H/o STDs, HPV + Pseudotumor Cerebri Obesity -- gastric banding ~ 2006  PLAN:  Viral syndrome Symptoms started 2 days ago, her  flu test is negative however we have several documented cases of influenza  @ this office. Plan: Empiric Tamiflu, rest, fluids. She is aware that influenza has the potential to be severe, call if not improving

## 2015-09-12 NOTE — Assessment & Plan Note (Signed)
Viral syndrome Symptoms started 2 days ago, her  flu test is negative however we have several documented cases of influenza @ this office. Plan: Empiric Tamiflu, rest, fluids. She is aware that influenza has the potential to be severe, call if not improving

## 2015-09-14 ENCOUNTER — Encounter: Payer: Self-pay | Admitting: Internal Medicine

## 2015-10-13 ENCOUNTER — Encounter: Payer: Self-pay | Admitting: Internal Medicine

## 2015-10-13 ENCOUNTER — Ambulatory Visit (INDEPENDENT_AMBULATORY_CARE_PROVIDER_SITE_OTHER): Payer: BLUE CROSS/BLUE SHIELD | Admitting: Internal Medicine

## 2015-10-13 VITALS — BP 128/74 | HR 78 | Temp 98.2°F | Ht 63.0 in | Wt 276.5 lb

## 2015-10-13 DIAGNOSIS — J9801 Acute bronchospasm: Secondary | ICD-10-CM

## 2015-10-13 DIAGNOSIS — Z09 Encounter for follow-up examination after completed treatment for conditions other than malignant neoplasm: Secondary | ICD-10-CM

## 2015-10-13 MED ORDER — AZITHROMYCIN 250 MG PO TABS: ORAL_TABLET | ORAL | Status: DC

## 2015-10-13 MED ORDER — PREDNISONE 10 MG PO TABS: ORAL_TABLET | ORAL | Status: DC

## 2015-10-13 MED ORDER — BECLOMETHASONE DIPROPIONATE 80 MCG/ACT IN AERS: 2.0000 | INHALATION_SPRAY | Freq: Two times a day (BID) | RESPIRATORY_TRACT | Status: DC

## 2015-10-13 NOTE — Progress Notes (Signed)
Pre visit review using our clinic review tool, if applicable. No additional management support is needed unless otherwise documented below in the visit note. 

## 2015-10-13 NOTE — Progress Notes (Signed)
Subjective:    Patient ID: Samantha Smith, female    DOB: 04/12/1971, 45 y.o.   MRN: [id number]  DOS:  10/13/2015 Type of visit - description : Acute visit  Interval history:  Last month was seen with a viral syndrome, never got completely well. Continue with chest congestion, in the morning she cough up some sputum. Cough is triggered by trying to talk at work. Went to urgent care 09/22/2015: got a  nebulization, Medrol injection and was prescribed albuterol. She gets really jittery with albuterol. No antibiotics were prescribed No history of previous asthma   Review of Systems  Denies fever, chills. Does have postnasal dripping and green nasal discharge most mornings. No sinus pain No nausea vomiting No GERD type of symptoms   Past Medical History  Diagnosis Date  . Positive PPD     s/p abx per patient  . Thyroid disease   . STD (sexually transmitted disease)     Chlamydia history  . Blurred vision   . Morbid obesity ([facility])   . ASCUS (atypical squamous cells of undetermined significance) on Pap smear 12/2012  . 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 Pseudotumor cerebri   . CA cervix ([place]) 02/26/2013    Past Surgical History  Procedure Laterality Date  . Cholecystectomy  12/01  . Right fallopian tube tied  2005    RIGHT  . Laparoscopic gastric banding  2008    see OV 06/09/08- ?Malfunction  . Ectopic pregnancy surgery      LEFT  . Left fallopian tube removed  2011  . Brain tumor excision    . Laparoscopic radical total hysterectomy w/ node biopsy, right salpingectomy  02/2013    [facility]  . Orif ankle fracture Left 06/16/2014    Procedure: OPEN REDUCTION INTERNAL FIXATION (ORIF) LEFT ANKLE, LISFRANC FRACTURE;  Surgeon: Marianna Payment, MD;  Location: [facility];  Service: Orthopedics;  Laterality: Left;    Social History   Social History  . Marital Status: Married    Spouse Name: N/A  .  Number of Children: 0  . Years of Education: N/A   Occupational History  . Accountant      Social History Main Topics  . Smoking status: Never Smoker   . Smokeless tobacco: Never Used  . Alcohol Use: No  . Drug Use: No  . Sexual Activity: Not on file   Other Topics Concern  . Not on file   Social History Narrative        Medication List       This list is accurate as of: 10/13/15  3:32 PM.  Always use your most recent med list.               azithromycin 250 MG tablet  Commonly known as:  ZITHROMAX Z-PAK  2 tabs a day the first day, then 1 tab a day x 4 days     beclomethasone 80 MCG/ACT inhaler  Commonly known as:  QVAR  Inhale 2 puffs into the lungs 2 (two) times daily.     levothyroxine 100 MCG tablet  Commonly known as:  SYNTHROID, LEVOTHROID  Take 1 tablet (100 mcg total) by mouth daily before breakfast.     loratadine 10 MG tablet  Commonly known as:  CLARITIN  Take 10 mg by mouth daily.     predniSONE 10 MG tablet  Commonly known as:  DELTASONE  4 tablets x 2 days, 3 tabs x 2 days, 2 tabs x 2 days, 1 tab x 2 days     PROAIR HFA 108 (90 Base) MCG/ACT inhaler  Generic drug:  albuterol  Inhale 1 puff into the lungs every 6 (six) hours as needed for wheezing or shortness of breath.           Objective:   Physical Exam BP 128/74 mmHg  Pulse 78  Temp(Src) 98.2 F (36.8 C) (Oral)  Ht 5\' 3"  (1.6 m)  Wt 276 lb 8 oz (125.42 kg)  BMI 48.99 kg/m2  SpO2 98%  LMP 01/02/2013 General:   Well developed, well nourished . NAD.  HEENT:  Normocephalic . Face symmetric, atraumatic. TMs normal, throat normal. Nose is slightly congested, sinuses no TTP Lungs:  increased expiratory time with  cough. Normal respiratory effort, no intercostal retractions, no accessory muscle use. Heart: RRR,  no murmur.  No pretibial edema bilaterally  Skin: Not pale. Not jaundice Neurologic:  alert & oriented X3.  Speech normal, gait appropriate for age and  unassisted Psych--  Cognition and judgment appear intact.  Cooperative with normal attention span and concentration.  Behavior appropriate. No anxious or depressed appearing.      Assessment & Plan:   Assessment > Hypothyroid + PPD, s/p antibiotics Cervical cancer, laparoscopic radical total hysterectomy 02-2013 H/o STDs, HPV + Pseudotumor Cerebri Obesity -- gastric banding ~ 2006    PLAN: Bronchospasm, atypical bronchitis? Symptoms consistent with bronchospasm although the exam long exam today is benign. Intolerant to albuterol recently prescribed by the urgent  care. Plan: Oral prednisone, Qvar, Mucinex, Flonase, Zithromax, see instructions

## 2015-10-13 NOTE — Assessment & Plan Note (Signed)
Symptoms consistent with bronchospasm although the exam long exam today is benign. Intolerant to albuterol recently prescribed by the urgent  care. Plan: Oral prednisone, Qvar, Mucinex, Flonase, Zithromax, see instructions

## 2015-10-13 NOTE — Patient Instructions (Signed)
Mucinex DM twice a day until better  Flonase, OTC: 2 sprays in each side of the nose every morning  Zithromax as prescribed  Prednisone as prescribed  Qvar: 2 sprays twice a day for 4 weeks, then as needed  Call if not gradually improving  Call if not back to normal in 2 weeks

## 2016-01-11 ENCOUNTER — Telehealth: Payer: Self-pay | Admitting: Internal Medicine

## 2016-01-11 NOTE — Telephone Encounter (Signed)
Relation to [id number] Call back number: [phone]    Reason for call:  Dr. Deloria Lair from [facility] advised patient to have her PCP refer her to a neurologist. Please advise

## 2016-01-11 NOTE — Telephone Encounter (Signed)
Have not received any OV notes for Pt, recommend to schedule an appt to discuss w/ Dr. Larose Kells.

## 2016-01-12 NOTE — Telephone Encounter (Signed)
LVM advising patient of message below °

## 2016-01-17 ENCOUNTER — Encounter: Payer: BLUE CROSS/BLUE SHIELD | Admitting: Internal Medicine

## 2016-01-20 ENCOUNTER — Encounter: Payer: Self-pay | Admitting: Internal Medicine

## 2016-02-01 ENCOUNTER — Encounter: Payer: BLUE CROSS/BLUE SHIELD | Admitting: Internal Medicine

## 2016-02-06 ENCOUNTER — Encounter: Payer: Self-pay | Admitting: Internal Medicine

## 2016-02-16 ENCOUNTER — Ambulatory Visit (INDEPENDENT_AMBULATORY_CARE_PROVIDER_SITE_OTHER): Payer: BLUE CROSS/BLUE SHIELD | Admitting: Physician Assistant

## 2016-02-16 VITALS — BP 106/78 | HR 81 | Temp 97.7°F | Resp 16 | Ht 62.0 in | Wt 275.0 lb

## 2016-02-16 DIAGNOSIS — G932 Benign intracranial hypertension: Secondary | ICD-10-CM

## 2016-02-16 NOTE — Progress Notes (Signed)
02/16/2016 6:16 PM   DOB: Jul 25, 1970 / MRN: [id number]  SUBJECTIVE:  Samantha Smith is a 45 y.o. female presenting for a brain CT.  She has a history of pseudotumor ceribri.  She is lost to follow up with neurology. She has been advised to avoid the sun as this can make her brain swell.  She went to the beach about 2 weeks ago and was out in the sun.  She reports since that time she has had worsening head pressure that has been consistent. She associates scotoma.   She was diagnosed with pseudotumor cerebri in March of 2014 and she did have fluid taken off.  She denies focal weakness, confusion, the loss of vision. She wants to the scan to see if the pseudotumor cerebri is back.  She had a normal CT scan on 09/29/14 and was presenting for nausea, HA, and vision changes. The scan was normal at that time.   She is allergic to demeclocycline; tetracyclines & related; and adhesive [tape].   She  has a past medical history of ASCUS (atypical squamous cells of undetermined significance) on Pap smear (12/2012); Blurred vision; CA cervix ([place]) (02/26/2013); Cervical cancer ([place]) (02/2013); High risk HPV infection (12/2012); Morbid obesity ([place]); Positive PPD; Pseudotumor cerebri; STD (sexually transmitted disease); and Thyroid disease.    She  reports that she has never smoked. She has never used smokeless tobacco. She reports that she does not drink alcohol or use drugs. She  has no sexual activity history on file. The patient  has a past surgical history that includes Cholecystectomy (12/01); Right Fallopian Tube tied (2005); Laparoscopic gastric banding (2008); Ectopic pregnancy surgery; Left fallopian tube removed (2011); Brain tumor excision; LAPAROSCOPIC RADICAL TOTAL HYSTERECTOMY W/ NODE BIOPSY, RIGHT SALPINGECTOMY (02/2013); and ORIF ankle fracture (Left, 06/16/2014).  Her family history includes Breast cancer (age of onset: 2) in her mother; Diabetes in her maternal grandmother; Heart disease in her  sister; Hypertension in her father.  Review of Systems  Constitutional: Negative for fever.  Eyes: Positive for pain. Negative for discharge and redness.  Gastrointestinal: Negative for nausea.  Skin: Negative for rash.  Neurological: Positive for headaches. Negative for dizziness, tingling, tremors, sensory change, speech change, focal weakness, seizures and loss of consciousness.    Problem list and medications reviewed and updated by myself where necessary, and exist elsewhere in the encounter.   OBJECTIVE:  BP 106/78 (BP Location: Right Arm, Patient Position: Sitting, Cuff Size: Large)   Pulse 81   Temp 97.7 F (36.5 C)   Resp 16   Ht 5\' 2"  (1.575 m)   Wt 275 lb (124.7 kg)   LMP 01/02/2013   SpO2 98%   BMI 50.30 kg/m   Physical Exam  Constitutional: She is oriented to person, place, and time. She appears well-developed and well-nourished.  Cardiovascular: Normal rate and regular rhythm.   Pulmonary/Chest: Effort normal and breath sounds normal.  Abdominal: Soft. Bowel sounds are normal.  Musculoskeletal: Normal range of motion.  Neurological: She is alert and oriented to person, place, and time. She has normal strength and normal reflexes. She is not disoriented. She displays no atrophy and no tremor. No cranial nerve deficit or sensory deficit. She exhibits normal muscle tone. She displays a negative Romberg sign. She displays no seizure activity. Coordination and gait normal. GCS eye subscore is 4. GCS verbal subscore is 5. GCS motor subscore is 6. She displays no Babinski's sign on the right side. She displays no Babinski's sign  on the left side.  Reflex Scores:      Tricep reflexes are 2+ on the right side and 2+ on the left side.      Bicep reflexes are 2+ on the right side and 2+ on the left side.      Brachioradialis reflexes are 2+ on the right side and 2+ on the left side.      Patellar reflexes are 2+ on the right side and 2+ on the left side.      Achilles  reflexes are 2+ on the right side and 2+ on the left side. RAM, Heel and toe walking, heel to shin, and FTN all intact to challenge.   Skin: Skin is warm and dry.  Psychiatric: She has a normal mood and affect. Her behavior is normal. Judgment and thought content normal.    No results found for this or any previous visit (from the past 72 hour(s)).  No results found.  ASSESSMENT AND PLAN  Samantha Smith was seen today for headache.  Diagnoses and all orders for this visit:   Pseudotumor cerebri: She is neurologically intact today and I see no evidence of papilledema of fundoscopic exam. She is having scotoma but not persistent.  I have educated her about pseudotumor cerebri in that a scan only shows there are no other abnormalities.  She has a history of the condition and has improved with lumbar puncture in the past.  She needs to see and be followed by neurology.  Her last CT scan was 1.5 years ago and normal.  -     Ambulatory referral to Neurology    The patient was advised to call or return to clinic if she does not see an improvement in symptoms, or to seek the care of the closest emergency department if she worsens with the above plan.   Philis Fendt, MHS, PA-C [facility] 02/16/2016 6:16 PM

## 2016-02-16 NOTE — Patient Instructions (Signed)
     IF you received an x-ray today, you will receive an invoice from [facility]. Please contact [facility] at [phone] with questions or concerns regarding your invoice.   IF you received labwork today, you will receive an invoice from [facility]. Please contact [facility] at [phone] with questions or concerns regarding your invoice.   Our billing staff will not be able to assist you with questions regarding bills from these companies.  You will be contacted with the lab results as soon as they are available. The fastest way to get your results is to activate your My Chart account. Instructions are located on the last page of this paperwork. If you have not heard from us regarding the results in 2 weeks, please contact this office.      

## 2016-02-23 LAB — HM PAP SMEAR

## 2016-03-05 ENCOUNTER — Ambulatory Visit (INDEPENDENT_AMBULATORY_CARE_PROVIDER_SITE_OTHER): Payer: BLUE CROSS/BLUE SHIELD | Admitting: Physician Assistant

## 2016-03-05 VITALS — BP 104/86 | HR 95 | Temp 98.0°F | Resp 16 | Ht 62.6 in | Wt 273.0 lb

## 2016-03-05 DIAGNOSIS — N3001 Acute cystitis with hematuria: Secondary | ICD-10-CM | POA: Diagnosis not present

## 2016-03-05 DIAGNOSIS — R300 Dysuria: Secondary | ICD-10-CM

## 2016-03-05 DIAGNOSIS — R350 Frequency of micturition: Secondary | ICD-10-CM

## 2016-03-05 LAB — POCT URINALYSIS DIP (MANUAL ENTRY)
BILIRUBIN UA: NEGATIVE
GLUCOSE UA: NEGATIVE
Ketones, POC UA: NEGATIVE
NITRITE UA: POSITIVE — AB
PH UA: 6
Spec Grav, UA: <=1.005
UROBILINOGEN UA: 0.2

## 2016-03-05 LAB — POC MICROSCOPIC URINALYSIS (UMFC): MUCUS RE: ABSENT

## 2016-03-05 MED ORDER — CIPROFLOXACIN HCL 500 MG PO TABS: 500.0000 mg | ORAL_TABLET | Freq: Two times a day (BID) | ORAL | 0 refills | Status: AC

## 2016-03-05 NOTE — Progress Notes (Signed)
03/05/2016 4:21 PM   DOB: [birth year] / MRN: [id number]  SUBJECTIVE:  Samantha Smith is a 45 y.o. female presenting for dysuria, urgency, frequency that started last night.  She has been taking AZO with mild relief.  Denies any precipitating factors.  Denies fever, chills, nausea, dizziness.   She is allergic to demeclocycline; tetracyclines & related; and adhesive [tape].   She  has a past medical history of ASCUS (atypical squamous cells of undetermined significance) on Pap smear (12/2012); Blurred vision; CA cervix ([place]) (02/26/2013); Cervical cancer ([place]) (02/2013); High risk HPV infection (12/2012); Morbid obesity ([place]); Positive PPD; Pseudotumor cerebri; STD (sexually transmitted disease); and Thyroid disease.    She  reports that she has never smoked. She has never used smokeless tobacco. She reports that she does not drink alcohol or use drugs. She  has no sexual activity history on file. The patient  has a past surgical history that includes Cholecystectomy (12/01); Right Fallopian Tube tied (2005); Laparoscopic gastric banding (2008); Ectopic pregnancy surgery; Left fallopian tube removed (2011); Brain tumor excision; LAPAROSCOPIC RADICAL TOTAL HYSTERECTOMY W/ NODE BIOPSY, RIGHT SALPINGECTOMY (02/2013); and ORIF ankle fracture (Left, 06/16/2014).  Her family history includes Breast cancer (age of onset: 69) in her mother; Diabetes in her maternal grandmother; Heart disease in her sister; Hypertension in her father.  Review of Systems  Genitourinary: Negative for flank pain.  Musculoskeletal: Negative for myalgias.  Skin: Negative for rash.    The problem list and medications were reviewed and updated by myself where necessary and exist elsewhere in the encounter.   OBJECTIVE:  BP 104/86 (BP Location: Right Arm, Patient Position: Sitting, Cuff Size: Large)   Pulse 95   Temp 98 F (36.7 C)   Resp 16   Ht 5' 2.6" (1.59 m)   Wt 273 lb (123.8 kg)   LMP 01/02/2013   SpO2 98%   BMI  48.98 kg/m   Physical Exam  Constitutional: She is oriented to person, place, and time. She appears well-nourished. No distress.  Eyes: EOM are normal. Pupils are equal, round, and reactive to light.  Cardiovascular: Normal rate.   Pulmonary/Chest: Effort normal.  Abdominal: She exhibits no distension.  Neurological: She is alert and oriented to person, place, and time. No cranial nerve deficit. Gait normal.  Skin: Skin is dry. She is not diaphoretic.  Psychiatric: She has a normal mood and affect.  Vitals reviewed.   Results for orders placed or performed in visit on 03/05/16 (from the past 72 hour(s))  POCT urinalysis dipstick     Status: Abnormal   Collection Time: 03/05/16  4:01 PM  Result Value Ref Range   Color, UA orange (A) yellow   Clarity, UA cloudy (A) clear   Glucose, UA negative negative   Bilirubin, UA negative negative   Ketones, POC UA negative negative   Spec Grav, UA <=1.005    Blood, UA moderate (A) negative   pH, UA 6.0    Protein Ur, POC trace (A) negative   Urobilinogen, UA 0.2    Nitrite, UA Positive (A) Negative   Leukocytes, UA small (1+) (A) Negative  POCT Microscopic Urinalysis ([facility])     Status: Abnormal   Collection Time: 03/05/16  4:05 PM  Result Value Ref Range   WBC,UR,HPF,POC Too numerous to count  (A) None WBC/hpf   RBC,UR,HPF,POC Few (A) None RBC/hpf   Bacteria Many (A) None, Too numerous to count   Mucus Absent Absent   Epithelial Cells, UR Per Microscopy  Few (A) None, Too numerous to count cells/hpf   Lab Results  Component Value Date   CREATININE 0.57 06/16/2014     No results found.  ASSESSMENT AND PLAN  Judeen was seen today for dysuria and urinary frequency.  Diagnoses and all orders for this visit:  Urinary frequency -     POCT Microscopic Urinalysis ([facility]) -     POCT urinalysis dipstick  Dysuria -     POCT Microscopic Urinalysis ([facility]) -     POCT urinalysis dipstick  Acute cystitis with hematuria -     Urine  culture  Other orders -     ciprofloxacin (CIPRO) 500 MG tablet; Take 1 tablet (500 mg total) by mouth 2 (two) times daily.    The patient is advised to call or return to clinic if she does not see an improvement in symptoms, or to seek the care of the closest emergency department if she worsens with the above plan.   Philis Fendt, MHS, PA-C [facility] 03/05/2016 4:21 PM

## 2016-03-05 NOTE — Patient Instructions (Signed)
     IF you received an x-ray today, you will receive an invoice from [facility]. Please contact [facility] at [phone] with questions or concerns regarding your invoice.   IF you received labwork today, you will receive an invoice from [facility]. Please contact [facility] at [phone] with questions or concerns regarding your invoice.   Our billing staff will not be able to assist you with questions regarding bills from these companies.  You will be contacted with the lab results as soon as they are available. The fastest way to get your results is to activate your My Chart account. Instructions are located on the last page of this paperwork. If you have not heard from us regarding the results in 2 weeks, please contact this office.      

## 2016-03-07 LAB — URINE CULTURE

## 2016-03-13 ENCOUNTER — Encounter: Payer: Self-pay | Admitting: Internal Medicine

## 2016-03-13 ENCOUNTER — Ambulatory Visit (INDEPENDENT_AMBULATORY_CARE_PROVIDER_SITE_OTHER): Payer: BLUE CROSS/BLUE SHIELD | Admitting: Internal Medicine

## 2016-03-13 VITALS — BP 118/74 | HR 71 | Temp 97.8°F | Resp 14 | Ht 63.0 in | Wt 275.1 lb

## 2016-03-13 DIAGNOSIS — E039 Hypothyroidism, unspecified: Secondary | ICD-10-CM | POA: Diagnosis not present

## 2016-03-13 DIAGNOSIS — Z0000 Encounter for general adult medical examination without abnormal findings: Secondary | ICD-10-CM | POA: Diagnosis not present

## 2016-03-13 DIAGNOSIS — G932 Benign intracranial hypertension: Secondary | ICD-10-CM

## 2016-03-13 NOTE — Progress Notes (Signed)
Pre visit review using our clinic review tool, if applicable. No additional management support is needed unless otherwise documented below in the visit note. 

## 2016-03-13 NOTE — Patient Instructions (Signed)
GO TO THE LAB : Get the blood work     GO TO THE FRONT DESK Schedule your next appointment for a  routine checkup in 8 months  

## 2016-03-13 NOTE — Assessment & Plan Note (Addendum)
Td 09; Pneumonia shot today Female care per gynecology, history of cervical cancer Mammogram 05-2015 negative  History of remote the scope, no report  Labs: CMP, FLP, CBC, TSH. Diet and exercise discussed

## 2016-03-13 NOTE — Progress Notes (Signed)
Subjective:    Patient ID: Samantha Smith, female    DOB: 1971-05-20, 45 y.o.   MRN: [id number]  DOS:  03/13/2016 Type of visit - description :  CPX Interval history: In general feeling okay. Was seen recently at another clinic, had a UTI, now asymptomatic. Also was seen with headaches, symptoms resolved. Request a neurology referral, see below   Review of Systems Constitutional: No fever. No chills. No unexplained wt changes. No unusual sweats  HEENT: No dental problems, no ear discharge, no facial swelling, no voice changes. No eye discharge, no eye  redness , no  intolerance to light   Respiratory: No wheezing , no  difficulty breathing. No cough , no mucus production  Cardiovascular: No CP, no leg swelling , no  Palpitations  GI: no nausea, no vomiting, no diarrhea , no  abdominal pain.  No blood in the stools. No dysphagia, no odynophagia    Endocrine: No polyphagia, no polyuria , no polydipsia  GU: No dysuria, gross hematuria, difficulty urinating. No urinary urgency, no frequency.  Musculoskeletal: No joint swellings or unusual aches or pains  Skin: No change in the color of the skin, palor , no  Rash  Allergic, immunologic: No environmental allergies , no  food allergies  Neurological: No dizziness no  syncope. No headaches. No diplopia, no slurred, no slurred speech, no motor deficits, no facial  Numbness  Hematological: No enlarged lymph nodes, no easy bruising , no unusual bleedings  Psychiatry: No suicidal ideas, no hallucinations, no beavior problems, no confusion.  No unusual/severe anxiety, no depression   Past Medical History:  Diagnosis Date  . ASCUS (atypical squamous cells of undetermined significance) on Pap smear 12/2012  . Blurred vision   . CA cervix ([place]) 02/26/2013  . Cervical cancer ([place]) 02/2013   Stage IB1 poorly differentiated squamous cell  . 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 Morbid obesity ([place])   . Positive PPD    s/p abx per patient    . Pseudotumor cerebri   . STD (sexually transmitted disease)    Chlamydia history  . Thyroid disease     Past Surgical History:  Procedure Laterality Date  . CHOLECYSTECTOMY  12/01  . LAPAROSCOPIC GASTRIC BANDING  2008   see OV 06/09/08- ?Malfunction  . LAPAROSCOPIC RADICAL TOTAL HYSTERECTOMY W/ NODE BIOPSY, RIGHT SALPINGECTOMY  02/2013   [facility]  . ORIF ANKLE FRACTURE Left 06/16/2014   Procedure: OPEN REDUCTION INTERNAL FIXATION (ORIF) LEFT ANKLE, LISFRANC FRACTURE;  Surgeon: Marianna Payment, MD;  Location: [facility];  Service: Orthopedics;  Laterality: Left;  . SALPINGECTOMY Left 2011   ectopic pregnancy  . TUBAL LIGATION Right 2005    Social History   Social History  . Marital status: Married    Spouse name: N/A  . Number of children: 0  . Years of education: N/A   Occupational History  . Accountant      Social History Main Topics  . Smoking status: Never Smoker  . Smokeless tobacco: Never Used  . Alcohol use No  . Drug use: No  . Sexual activity: Not on file   Other Topics Concern  . Not on file   Social History Narrative  . No narrative on file     Family History  Problem Relation Age of Onset  . Hypertension Father   . Breast cancer Mother 90    [name]  . Heart disease Sister   . Diabetes Maternal Grandmother   .  Colon cancer Paternal Grandfather     dx age 15s?  Marland Kitchen Coronary artery disease Neg Hx   . Stroke Neg Hx       Medication List       Accurate as of 03/13/16 11:59 PM. Always use your most recent med list.          beclomethasone 80 MCG/ACT inhaler Commonly known as:  QVAR Inhale 2 puffs into the lungs 2 (two) times daily.   levothyroxine 100 MCG tablet Commonly known as:  SYNTHROID, LEVOTHROID Take 1 tablet (100 mcg total) by mouth daily before breakfast.   PROAIR HFA 108 (90 Base) MCG/ACT inhaler Generic drug:  albuterol Inhale 1 puff into the lungs every 6 (six) hours as needed for wheezing or shortness of  breath.          Objective:   Physical Exam BP 118/74 (BP Location: Left Arm, Patient Position: Sitting, Cuff Size: Normal)   Pulse 71   Temp 97.8 F (36.6 C) (Oral)   Resp 14   Ht 5\' 3"  (1.6 m)   Wt 275 lb 2 oz (124.8 kg)   LMP 01/02/2013   SpO2 93%   BMI 48.74 kg/m   General:   Well developed, well nourished . NAD.  Neck: No  thyromegaly  HEENT:  Normocephalic . Face symmetric, atraumatic Lungs:  CTA B Normal respiratory effort, no intercostal retractions, no accessory muscle use. Heart: RRR,  no murmur.  No pretibial edema bilaterally  Abdomen:  Not distended, soft, non-tender. No rebound or rigidity.   Skin: Exposed areas without rash. Not pale. Not jaundice Neurologic:  alert & oriented X3.  Speech normal, gait appropriate for age and unassisted Strength symmetric and appropriate for age.  Psych: Cognition and judgment appear intact.  Cooperative with normal attention span and concentration.  Behavior appropriate. No anxious or depressed appearing.    Assessment & Plan:    Assessment  Hypothyroid + PPD, s/p antibiotics Cervical cancer, laparoscopic radical total hysterectomy 02-2013 H/o STDs, HPV + Pseudotumor Cerebri- dx 2014, see neuro note 11-06-12 (also saw 2 additional neurologist including Dr Harvel Ricks 11-22-15) Obesity -- gastric banding ~ 2006 H/o vit d def    PLAN: Hypothyroidism: Good med compliance, check a TSH Bronchospasm: Improved, still has inhalers just in case Pseudotumor cerebri: Currently asx, strongly requests to see Dr. Harvel Ricks as requested by her ophthalmologist. Will refer her. Vit d def-- rec to take supplements, states they cause constipation >>>>> try  one tablet 3 times a week, labs on RTC RTC 8 months

## 2016-03-14 LAB — CBC WITH DIFFERENTIAL/PLATELET
BASOS ABS: 0 10*3/uL (ref 0.0–0.1)
Basophils Relative: 0.5 % (ref 0.0–3.0)
Eosinophils Absolute: 0.1 10*3/uL (ref 0.0–0.7)
Eosinophils Relative: 1.3 % (ref 0.0–5.0)
HCT: 41 % (ref 36.0–46.0)
Hemoglobin: 13.9 g/dL (ref 12.0–15.0)
LYMPHS ABS: 2.7 10*3/uL (ref 0.7–4.0)
Lymphocytes Relative: 26.9 % (ref 12.0–46.0)
MCHC: 33.9 g/dL (ref 30.0–36.0)
MCV: 92.1 fl (ref 78.0–100.0)
MONO ABS: 0.4 10*3/uL (ref 0.1–1.0)
MONOS PCT: 4.1 % (ref 3.0–12.0)
NEUTROS ABS: 6.8 10*3/uL (ref 1.4–7.7)
NEUTROS PCT: 67.2 % (ref 43.0–77.0)
PLATELETS: 306 10*3/uL (ref 150.0–400.0)
RBC: 4.45 Mil/uL (ref 3.87–5.11)
RDW: 13.9 % (ref 11.5–15.5)
WBC: 10.1 10*3/uL (ref 4.0–10.5)

## 2016-03-14 LAB — LIPID PANEL
CHOL/HDL RATIO: 4
Cholesterol: 180 mg/dL (ref 0–200)
HDL: 49.1 mg/dL (ref >39.00)
LDL CALC: 99 mg/dL (ref 0–99)
NonHDL: 130.65
TRIGLYCERIDES: 159 mg/dL — AB (ref 0.0–149.0)
VLDL: 31.8 mg/dL (ref 0.0–40.0)

## 2016-03-14 LAB — COMPREHENSIVE METABOLIC PANEL
ALT: 14 U/L (ref 0–35)
AST: 15 U/L (ref 0–37)
Albumin: 3.9 g/dL (ref 3.5–5.2)
Alkaline Phosphatase: 81 U/L (ref 39–117)
BILIRUBIN TOTAL: 0.5 mg/dL (ref 0.2–1.2)
BUN: 8 mg/dL (ref 6–23)
CALCIUM: 8.8 mg/dL (ref 8.4–10.5)
CHLORIDE: 103 meq/L (ref 96–112)
CO2: 27 meq/L (ref 19–32)
Creatinine, Ser: 0.57 mg/dL (ref 0.40–1.20)
GFR: 122.25 mL/min (ref >60.00)
GLUCOSE: 69 mg/dL — AB (ref 70–99)
POTASSIUM: 4.1 meq/L (ref 3.5–5.1)
Sodium: 137 mEq/L (ref 135–145)
Total Protein: 6.8 g/dL (ref 6.0–8.3)

## 2016-03-14 LAB — TSH: TSH: 2.66 u[IU]/mL (ref 0.35–4.50)

## 2016-03-14 NOTE — Assessment & Plan Note (Signed)
Hypothyroidism: Good med compliance, check a TSH Bronchospasm: Improved, still has inhalers just in case Pseudotumor cerebri: Currently asx, strongly requests to see Dr. Harvel Ricks as requested by her ophthalmologist. Will refer her. Vit d def-- rec to take supplements, states they cause constipation >>>>> try  one tablet 3 times a week, labs on RTC RTC 8 months

## 2016-03-19 ENCOUNTER — Encounter: Payer: Self-pay | Admitting: Internal Medicine

## 2016-04-23 ENCOUNTER — Other Ambulatory Visit: Payer: Self-pay | Admitting: Internal Medicine

## 2016-04-23 ENCOUNTER — Encounter: Payer: Self-pay | Admitting: Internal Medicine

## 2016-04-23 DIAGNOSIS — E079 Disorder of thyroid, unspecified: Secondary | ICD-10-CM

## 2016-04-24 NOTE — Telephone Encounter (Signed)
Endo referral placed. 

## 2016-05-08 ENCOUNTER — Encounter: Payer: Self-pay | Admitting: Endocrinology

## 2016-05-14 ENCOUNTER — Ambulatory Visit
Admission: RE | Admit: 2016-05-14 | Discharge: 2016-05-14 | Disposition: A | Discharge status: Home / Self Care | Payer: BLUE CROSS/BLUE SHIELD | Source: Ambulatory Visit | Attending: Internal Medicine | Admitting: Internal Medicine

## 2016-05-14 ENCOUNTER — Other Ambulatory Visit: Payer: Self-pay | Admitting: Internal Medicine

## 2016-05-14 DIAGNOSIS — M25562 Pain in left knee: Secondary | ICD-10-CM

## 2016-05-14 DIAGNOSIS — R609 Edema, unspecified: Secondary | ICD-10-CM

## 2016-05-16 ENCOUNTER — Other Ambulatory Visit: Payer: Self-pay | Admitting: Internal Medicine

## 2016-05-16 DIAGNOSIS — M25562 Pain in left knee: Secondary | ICD-10-CM

## 2016-05-24 ENCOUNTER — Other Ambulatory Visit: Payer: BLUE CROSS/BLUE SHIELD

## 2016-06-06 ENCOUNTER — Other Ambulatory Visit: Payer: BLUE CROSS/BLUE SHIELD

## 2016-06-19 ENCOUNTER — Ambulatory Visit (INDEPENDENT_AMBULATORY_CARE_PROVIDER_SITE_OTHER): Payer: BLUE CROSS/BLUE SHIELD | Admitting: Orthopaedic Surgery

## 2016-06-19 ENCOUNTER — Ambulatory Visit (INDEPENDENT_AMBULATORY_CARE_PROVIDER_SITE_OTHER): Payer: Self-pay

## 2016-06-19 ENCOUNTER — Encounter (INDEPENDENT_AMBULATORY_CARE_PROVIDER_SITE_OTHER): Payer: Self-pay | Admitting: Orthopaedic Surgery

## 2016-06-19 VITALS — Ht 63.0 in | Wt 275.0 lb

## 2016-06-19 DIAGNOSIS — G8929 Other chronic pain: Secondary | ICD-10-CM

## 2016-06-19 DIAGNOSIS — M25561 Pain in right knee: Secondary | ICD-10-CM

## 2016-06-19 NOTE — Progress Notes (Signed)
Office Visit Note   Patient: Samantha Smith           Date of Birth: [birth year]           MRN: [id number] Visit Date: 06/19/2016              Requested by: Colon Branch, MD [address] [place], [place] [address] PCP: Kathlene November, MD   Assessment & Plan: Visit Diagnoses:  1. Chronic pain of right knee     Plan: Impression is left knee arthritis flareup with baseline mild arthritis. We discussed at length treatment options and I answered them to her satisfaction. She declined an injection today. She will give [place] a call if she changes her mind. I'll see her back as needed.Total face to face encounter time was greater than 25 minutes and over half of this time was spent in counseling and/or coordination of care.   Follow-Up Instructions: Return if symptoms worsen or fail to improve.   Orders:  Orders Placed This Encounter  Procedures  . XR KNEE 3 VIEW RIGHT   No orders of the defined types were placed in this encounter.     Procedures: No procedures performed   Clinical Data: No additional findings.   Subjective: Chief Complaint  Patient presents with  . Right Knee - Pain    Samantha. Smith comes in today for left knee pain for about 2 months. She denies any injuries or mechanical symptoms. The pain is worse with swelling and with activity. She states that occasionally it'll pop but not necessarily associated with pain. She takes Mobic with partial relief. She does feel like there is a loose body sensation. Denies any numbness or tingling.    Review of Systems  Constitutional: Negative.   HENT: Negative.   Eyes: Negative.   Respiratory: Negative.   Cardiovascular: Negative.   Endocrine: Negative.   Musculoskeletal: Negative.   Neurological: Negative.   Hematological: Negative.   Psychiatric/Behavioral: Negative.   All other systems reviewed and are negative.    Objective: Vital Signs: Ht 5\' 3"  (1.6 m)   Wt 275 lb (124.7 kg)   LMP 01/02/2013    BMI 48.71 kg/m   Physical Exam  Constitutional: She is oriented to person, place, and time. She appears well-developed and well-nourished.  HENT:  Head: Atraumatic.  Eyes: EOM are normal.  Neck: Neck supple.  Cardiovascular: Intact distal pulses.   Pulmonary/Chest: Effort normal.  Abdominal: Soft.  Neurological: She is alert and oriented to person, place, and time.  Skin: Skin is warm. Capillary refill takes less than 2 seconds.  Psychiatric: She has a normal mood and affect. Her behavior is normal. Judgment and thought content normal.  Nursing note and vitals reviewed.   Ortho Exam Exam of the left knee shows a trace effusion. Range of motion is normal. Collaterals and cruciates are stable. No meniscal signs.  Specialty Comments:  No specialty comments available.  Imaging: Xr Knee 3 View Right  Result Date: 06/19/2016 Mild DJD of left knee    PMFS History: Patient Active Problem List   Diagnosis Date Noted  . PCP NOTES >>>>>>>>>>>>>>>>>>>>>>>>>>>>>>>>>>>>>>> 04/06/2015  . Annual physical exam 12/27/2014  . Bilateral ankle fractures 06/13/2014  . Lisfranc's sprain 06/13/2014  . Transient hypotension 06/13/2014  . CA cervix ([place]) 02/26/2013  . Skin lesion 11/10/2012  . Positive PPD   . STD (sexually transmitted disease)   . Pseudotumor cerebri   . Morbid obesity ([place])   .  LGSIL (low grade squamous intraepithelial dysplasia)   . Paresthesias 02/06/2011  . DEFICIENCY, VITAMIN D NOS 02/03/2007  . CND/STAT, MOTHER CMPLG PRG, BRTRIC SRG PP 02/03/2007  . PSTPRC STATUS, BARIATRIC SURGERY 02/03/2007   Past Medical History:  Diagnosis Date  . ASCUS (atypical squamous cells of undetermined significance) on Pap smear 12/2012  . Blurred vision   . Cervical cancer ([place]) 02/2013   Stage IB1 poorly differentiated squamous cell  . 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 Morbid obesity ([place])   . Positive PPD    s/p abx per patient  . Pseudotumor cerebri   . STD (sexually  transmitted disease)    Chlamydia history  . Thyroid disease     Family History  Problem Relation Age of Onset  . Hypertension Father   . Breast cancer Mother 7    Samantha Smith  . Heart disease Sister   . Diabetes Maternal Grandmother   . Colon cancer Paternal Grandfather     dx age 92s?  Marland Kitchen Coronary artery disease Neg Hx   . Stroke Neg Hx     Past Surgical History:  Procedure Laterality Date  . CHOLECYSTECTOMY  12/01  . LAPAROSCOPIC GASTRIC BANDING  2008   see OV 06/09/08- ?Malfunction  . LAPAROSCOPIC RADICAL TOTAL HYSTERECTOMY W/ NODE BIOPSY, RIGHT SALPINGECTOMY  02/2013   [facility]  . ORIF ANKLE FRACTURE Left 06/16/2014   Procedure: OPEN REDUCTION INTERNAL FIXATION (ORIF) LEFT ANKLE, LISFRANC FRACTURE;  Surgeon: Marianna Payment, MD;  Location: [facility];  Service: Orthopedics;  Laterality: Left;  . SALPINGECTOMY Left 2011   ectopic pregnancy  . TUBAL LIGATION Right 2005   Social History   Occupational History  . Accountant      Social History Main Topics  . Smoking status: Never Smoker  . Smokeless tobacco: Never Used  . Alcohol use No  . Drug use: No  . Sexual activity: Not on file

## 2016-11-05 ENCOUNTER — Ambulatory Visit (INDEPENDENT_AMBULATORY_CARE_PROVIDER_SITE_OTHER): Payer: BLUE CROSS/BLUE SHIELD | Admitting: Physician Assistant

## 2016-11-05 VITALS — BP 129/81 | HR 68 | Temp 98.4°F | Resp 16 | Ht 62.5 in | Wt 276.0 lb

## 2016-11-05 DIAGNOSIS — R1012 Left upper quadrant pain: Secondary | ICD-10-CM

## 2016-11-05 LAB — POCT CBC
Granulocyte percent: 60.4 %G (ref 37–80)
HEMATOCRIT: 42.2 % (ref 37.7–47.9)
HEMOGLOBIN: 14.2 g/dL (ref 12.2–16.2)
LYMPH, POC: 2.6 (ref 0.6–3.4)
MCH, POC: 31.3 pg — AB (ref 27–31.2)
MCHC: 33.6 g/dL (ref 31.8–35.4)
MCV: 93.1 fL (ref 80–97)
MID (cbc): 0.7 (ref 0–0.9)
MPV: 8.1 fL (ref 0–99.8)
POC Granulocyte: 5.1 (ref 2–6.9)
POC LYMPH PERCENT: 30.8 %L (ref 10–50)
POC MID %: 8.8 %M (ref 0–12)
Platelet Count, POC: 303 10*3/uL (ref 142–424)
RBC: 4.53 M/uL (ref 4.04–5.48)
RDW, POC: 13.8 %
WBC: 8.4 10*3/uL (ref 4.6–10.2)

## 2016-11-05 MED ORDER — PANTOPRAZOLE SODIUM 40 MG PO TBEC: 40.0000 mg | DELAYED_RELEASE_TABLET | Freq: Every day | ORAL | 3 refills | Status: DC

## 2016-11-05 NOTE — Patient Instructions (Signed)
     IF you received an x-ray today, you will receive an invoice from [facility]. Please contact  Radiology at [phone] with questions or concerns regarding your invoice.   IF you received labwork today, you will receive an invoice from [facility]. Please contact [facility] at [phone] with questions or concerns regarding your invoice.   Our billing staff will not be able to assist you with questions regarding bills from these companies.  You will be contacted with the lab results as soon as they are available. The fastest way to get your results is to activate your My Chart account. Instructions are located on the last page of this paperwork. If you have not heard from us regarding the results in 2 weeks, please contact this office.     

## 2016-11-05 NOTE — Progress Notes (Signed)
11/05/2016 2:48 PM   DOB: 08-21-1970 / MRN: [id number]  SUBJECTIVE:  Samantha Smith is a 46 y.o. female with a history of s/p lap band surgery presenting for left sided rib pain that she descrbes as a burning (as she points to her left upper abdomen) Tells me this hurts worse when she eats spicy foods and gets better with defecation.  The she thinks this is going to be an ulcer due to those complaints, however she denies a history of ulcer. She reports one episode of bright red blood per rectum that occurred and resolved early in the illness. Says that wiping at that time hurt her. She has tried [employer] and this has helped her symptoms. She feels that she is getting somewhat better.   She has been using Miralax and tells me this works right away however she only did this once on the first day.  She has been somewhat constipated since that time.   She is allergic to demeclocycline; tetracyclines & related; and adhesive [tape].   She  has a past medical history of ASCUS (atypical squamous cells of undetermined significance) on Pap smear (12/2012); Blurred vision; Cervical cancer ([place]) (02/2013); High risk HPV infection (12/2012); Morbid obesity ([place]); Positive PPD; Pseudotumor cerebri; STD (sexually transmitted disease); and Thyroid disease.    She  reports that she has never smoked. She has never used smokeless tobacco. She reports that she does not drink alcohol or use drugs. She  has no sexual activity history on file. The patient  has a past surgical history that includes Cholecystectomy (12/01); Laparoscopic gastric banding (2008); LAPAROSCOPIC RADICAL TOTAL HYSTERECTOMY W/ NODE BIOPSY, RIGHT SALPINGECTOMY (02/2013); ORIF ankle fracture (Left, 06/16/2014); Tubal ligation (Right, 2005); and Salpingectomy (Left, 2011).  Her family history includes Breast cancer (age of onset: 50) in her mother; Colon cancer in her paternal grandfather; Diabetes in her maternal grandmother; Heart disease in her  sister; Hypertension in her father.  Review of Systems  Constitutional: Negative for chills, diaphoresis and fever.  Respiratory: Negative for cough, hemoptysis, sputum production, shortness of breath and wheezing.   Cardiovascular: Negative for chest pain, orthopnea and leg swelling.  Gastrointestinal: Positive for abdominal pain, blood in stool and constipation. Negative for diarrhea, melena and nausea.  Genitourinary: Negative for hematuria.  Skin: Negative for rash.  Neurological: Negative for dizziness.    The problem list and medications were reviewed and updated by myself where necessary and exist elsewhere in the encounter.   OBJECTIVE:  BP 129/81 (BP Location: Right Arm, Patient Position: Sitting, Cuff Size: Large)   Pulse 68   Temp 98.4 F (36.9 C) (Oral)   Resp 16   Ht 5' 2.5" (1.588 m)   Wt 276 lb (125.2 kg)   LMP 01/02/2013   SpO2 99%   BMI 49.68 kg/m   Physical Exam  Constitutional: She is active.  Cardiovascular: Normal rate, regular rhythm, normal heart sounds and intact distal pulses.   Pulmonary/Chest: Effort normal and breath sounds normal. No tachypnea. She has no wheezes. She has no rales.  Abdominal: There is tenderness (mild, left upper qudrant).  Musculoskeletal: Normal range of motion.  Neurological: She is alert.  Skin: Skin is warm and dry.    Results for orders placed or performed in visit on 11/05/16 (from the past 72 hour(s))  POCT CBC     Status: Abnormal   Collection Time: 11/05/16  1:54 PM  Result Value Ref Range   WBC 8.4 4.6 - 10.2 K/uL  Lymph, poc 2.6 0.6 - 3.4   POC LYMPH PERCENT 30.8 10 - 50 %L   MID (cbc) 0.7 0 - 0.9   POC MID % 8.8 0 - 12 %M   POC Granulocyte 5.1 2 - 6.9   Granulocyte percent 60.4 37 - 80 %G   RBC 4.53 4.04 - 5.48 M/uL   Hemoglobin 14.2 12.2 - 16.2 g/dL   HCT, POC 42.2 37.7 - 47.9 %   MCV 93.1 80 - 97 fL   MCH, POC 31.3 (A) 27 - 31.2 pg   MCHC 33.6 31.8 - 35.4 g/dL   RDW, POC 13.8 %   Platelet Count, POC  303 142 - 424 K/uL   MPV 8.1 0 - 99.8 fL    No results found.  ASSESSMENT AND PLAN:  Irais was seen today for abdominal pain.  Diagnoses and all orders for this visit:  Abdominal pain, left upper quadrant: HPI and exam consistent with GERD.  I will check for H. Pylori.  H&H normal. Advised she use miralax everyday.  I will see her back in about two weeks for a recheck pending labs.  -     H. pylori breath test -     Basic metabolic panel -     POCT CBC -     pantoprazole (PROTONIX) 40 MG tablet; Take 1 tablet (40 mg total) by mouth daily.    The patient is advised to call or return to clinic if she does not see an improvement in symptoms, or to seek the care of the closest emergency department if she worsens with the above plan.   Philis Fendt, MHS, PA-C [facility] 11/05/2016 2:48 PM

## 2016-11-06 LAB — BASIC METABOLIC PANEL
BUN/Creatinine Ratio: 19 (ref 9–23)
BUN: 11 mg/dL (ref 6–24)
CALCIUM: 9 mg/dL (ref 8.7–10.2)
CHLORIDE: 101 mmol/L (ref 96–106)
CO2: 23 mmol/L (ref 18–29)
Creatinine, Ser: 0.59 mg/dL (ref 0.57–1.00)
GFR, EST AFRICAN AMERICAN: 128 mL/min/{1.73_m2} (ref >59)
GFR, EST NON AFRICAN AMERICAN: 111 mL/min/{1.73_m2} (ref >59)
Glucose: 80 mg/dL (ref 65–99)
Potassium: 4.3 mmol/L (ref 3.5–5.2)
Sodium: 141 mmol/L (ref 134–144)

## 2016-11-06 LAB — H. PYLORI BREATH TEST: H. pylori UBiT: POSITIVE — AB

## 2016-11-07 ENCOUNTER — Other Ambulatory Visit: Payer: Self-pay | Admitting: Physician Assistant

## 2016-11-07 DIAGNOSIS — A048 Other specified bacterial intestinal infections: Secondary | ICD-10-CM

## 2016-11-07 MED ORDER — AMOXICILLIN-POT CLAVULANATE 875-125 MG PO TABS: 1.0000 | ORAL_TABLET | Freq: Two times a day (BID) | ORAL | 0 refills | Status: DC

## 2016-11-07 MED ORDER — CLARITHROMYCIN 500 MG PO TABS: 500.0000 mg | ORAL_TABLET | Freq: Two times a day (BID) | ORAL | 0 refills | Status: DC

## 2016-11-07 MED ORDER — ONDANSETRON HCL 4 MG PO TABS: 4.0000 mg | ORAL_TABLET | Freq: Three times a day (TID) | ORAL | 0 refills | Status: DC | PRN

## 2016-11-19 ENCOUNTER — Ambulatory Visit (INDEPENDENT_AMBULATORY_CARE_PROVIDER_SITE_OTHER): Payer: BLUE CROSS/BLUE SHIELD | Admitting: Physician Assistant

## 2016-11-19 ENCOUNTER — Encounter: Payer: Self-pay | Admitting: Physician Assistant

## 2016-11-19 VITALS — BP 120/82 | HR 92 | Temp 98.2°F | Resp 18 | Ht 62.4 in | Wt 276.2 lb

## 2016-11-19 DIAGNOSIS — R1012 Left upper quadrant pain: Secondary | ICD-10-CM | POA: Diagnosis not present

## 2016-11-19 NOTE — Progress Notes (Signed)
  11/19/2016 2:30 PM   DOB: October 31, 1970 / MRN: [id number]  SUBJECTIVE:  Samantha Smith is a 46 y.o. female presenting for recheck of H. Pylori.  She has completed triple therapy and tells me she has been asymptomatic since last Thursday.   She is allergic to demeclocycline; tetracyclines & related; and adhesive [tape].    Review of Systems  Gastrointestinal: Negative for abdominal pain, blood in stool, constipation, diarrhea, melena and nausea.  Genitourinary: Negative for flank pain.    The problem list and medications were reviewed and updated by myself where necessary and exist elsewhere in the encounter.   OBJECTIVE:  BP 120/82   Pulse 92   Temp 98.2 F (36.8 C) (Oral)   Resp 18   Ht 5' 2.4" (1.585 m)   Wt 276 lb 3.2 oz (125.3 kg)   LMP 01/02/2013   SpO2 95%   BMI 49.87 kg/m   Physical Exam  Cardiovascular: Normal rate.   Pulmonary/Chest: Effort normal.  Abdominal: Soft. Bowel sounds are normal. She exhibits no distension and no mass. There is no tenderness. There is no rebound and no guarding.    No results found for this or any previous visit (from the past 72 hour(s)).  No results found.  ASSESSMENT AND PLAN:  Samantha Smith was seen today for follow-up.  Diagnoses and all orders for this visit:  Abdominal pain, left upper quadrant Comments: S/P positive H. Pylori and triple therapy.  Asymptomatic today.     The patient is advised to call or return to clinic if she does not see an improvement in symptoms, or to seek the care of the closest emergency department if she worsens with the above plan.   Samantha Smith, MHS, PA-C [facility] 11/19/2016 2:30 PM

## 2016-11-19 NOTE — Patient Instructions (Addendum)
Come back as needed! I am glad you are feeling better.     We recommend that you schedule a mammogram for breast cancer screening. Typically, you do not need a referral to do this. Please contact a local imaging center to schedule your mammogram.  [facility] - [phone]  *ask for the Radiology Department [facility] - [phone] or [phone]  [facility] - [phone] [phone] [facility] - [phone]  *ask for the [facility] - [phone]  *ask for the Radiology Department [facility] - [phone]  *ask for the [facility] - [phone]     IF you received an x-ray today, you will receive an invoice from [facility]. Please contact [facility] at [phone] with questions or concerns regarding your invoice.   IF you received labwork today, you will receive an invoice from [facility]. Please contact [facility] at [phone] with questions or concerns regarding your invoice.   Our billing staff will not be able to assist you with questions regarding bills from these companies.  You will be contacted with the lab results as soon as they are available. The fastest way to get your results is to activate your My Chart account. Instructions are located on the last page of this paperwork. If you have not heard from [place] regarding the results in 2 weeks, please contact this office.

## 2016-11-20 ENCOUNTER — Encounter: Payer: Self-pay | Admitting: Gynecology

## 2016-11-20 ENCOUNTER — Ambulatory Visit (INDEPENDENT_AMBULATORY_CARE_PROVIDER_SITE_OTHER): Payer: BLUE CROSS/BLUE SHIELD | Admitting: Gynecology

## 2016-11-20 VITALS — BP 124/80 | Ht 62.0 in | Wt 275.0 lb

## 2016-11-20 DIAGNOSIS — C539 Malignant neoplasm of cervix uteri, unspecified: Secondary | ICD-10-CM

## 2016-11-20 DIAGNOSIS — Z1151 Encounter for screening for human papillomavirus (HPV): Secondary | ICD-10-CM | POA: Diagnosis not present

## 2016-11-20 DIAGNOSIS — Z01419 Encounter for gynecological examination (general) (routine) without abnormal findings: Secondary | ICD-10-CM

## 2016-11-20 NOTE — Addendum Note (Signed)
Addended by: Nelva Nay on: 11/20/2016 12:01 PM   Modules accepted: Orders

## 2016-11-20 NOTE — Patient Instructions (Signed)
Schedule and follow up for your mammogram.

## 2016-11-20 NOTE — Progress Notes (Signed)
    Samantha Smith 1971/05/27 [id number]        46 y.o.  G3P0030 for annual exam.  History of Stage IB 1 cervical cancer status post robotic radical hysterectomy and lymph node dissection 02/2013 at [facility].  Past medical history,surgical history, problem list, medications, allergies, family history and social history were all reviewed and documented as reviewed in the EPIC chart.  ROS:  Performed with pertinent positives and negatives included in the history, assessment and plan.   Additional significant findings :  None   Exam: [name] assistant Vitals:   11/20/16 1035  BP: 124/80  Weight: 275 lb (124.7 kg)  Height: 5\' 2"  (1.575 m)   Body mass index is 50.3 kg/m.  General appearance:  Normal affect, orientation and appearance. Skin: Grossly normal HEENT: Without gross lesions.  No cervical or supraclavicular adenopathy. Thyroid normal.  Lungs:  Clear without wheezing, rales or rhonchi Cardiac: RR, without RMG Abdominal:  Soft, nontender, without masses, guarding, rebound, organomegaly or hernia Breasts:  Examined lying and sitting without masses, retractions, discharge or axillary adenopathy. Pelvic:  Ext, BUS, Vagina: Normal. Pap smear/HPV of vaginal cuff  Adnexa: Without gross masses or tenderness    Anus and perineum: Normal   Rectovaginal: Normal sphincter tone without palpated masses or tenderness.    Assessment/Plan:  46 y.o. G59P0030 female for annual exam.   1. History of stage IB 1 cervical cancer status post robotic radical hysterectomy 2014 with lymph node dissection. Exam NED. Pap smear of cuff done. Plans follow up appointment in 6 months at [facility]. 2. Mammography [place]. I reminded her to schedule this and she agrees to call and schedule. SBE monthly reviewed. 3. Health maintenance. No routine lab work done as patient reports this done elsewhere. Follow up in one year year, 6 months at [facility].   Anastasio Auerbach MD, 10:49 AM 11/20/2016

## 2016-11-22 ENCOUNTER — Other Ambulatory Visit: Payer: Self-pay | Admitting: Gynecology

## 2016-11-22 DIAGNOSIS — Z1231 Encounter for screening mammogram for malignant neoplasm of breast: Secondary | ICD-10-CM

## 2016-11-23 ENCOUNTER — Encounter: Payer: Self-pay | Admitting: Gynecology

## 2016-11-23 LAB — PAP IG AND HPV HIGH-RISK: HPV DNA High Risk: NOT DETECTED

## 2016-12-19 ENCOUNTER — Ambulatory Visit
Admission: RE | Admit: 2016-12-19 | Discharge: 2016-12-19 | Disposition: A | Discharge status: Home / Self Care | Payer: BLUE CROSS/BLUE SHIELD | Source: Ambulatory Visit | Attending: Gynecology | Admitting: Gynecology

## 2016-12-19 DIAGNOSIS — Z1231 Encounter for screening mammogram for malignant neoplasm of breast: Secondary | ICD-10-CM | POA: Diagnosis not present

## 2017-01-15 DIAGNOSIS — H5203 Hypermetropia, bilateral: Secondary | ICD-10-CM | POA: Diagnosis not present

## 2017-01-22 ENCOUNTER — Encounter: Payer: Self-pay | Admitting: Gynecology

## 2017-01-22 ENCOUNTER — Ambulatory Visit (INDEPENDENT_AMBULATORY_CARE_PROVIDER_SITE_OTHER): Payer: BLUE CROSS/BLUE SHIELD | Admitting: Gynecology

## 2017-01-22 VITALS — BP 124/80

## 2017-01-22 DIAGNOSIS — N644 Mastodynia: Secondary | ICD-10-CM

## 2017-01-22 NOTE — Progress Notes (Signed)
    Samantha Smith 11-Sep-1970 [id number]        46 y.o.  G3P0030 presents with a one-month history of right nipple pain. Patient notes daily right nipple pain particularly when her areola contracts sharp stabbing pain shooting into the nipple. Also notes a discoloration of her nipple although has been present for years unchanged she brought this to my attention. She did have nipple discomfort on the right noted 2016 evaluation. That came and went monthly but this seems to be a more constant over the last 4 weeks. No nipple discharge or palpable abnormalities on self-exam. Left nipple without abnormalities.  Past medical history,surgical history, problem list, medications, allergies, family history and social history were all reviewed and documented in the EPIC chart.  Directed ROS with pertinent positives and negatives documented in the history of present illness/assessment and plan.  Exam: [name] assistant Vitals:   01/22/17 1542  BP: 124/80   General appearance:  Normal Both breast examined lying and sitting without masses, retractions, discharge, adenopathy. Right nipple does have a slight hyperpigmented area lower half without concerning changes.   Assessment/Plan:  46 y.o. G3P0030 right nipple pain daily over the last month.  No findings on physical exam. Does have an area of increased pigmentation but patient notes it has been present for years unchanged and does not appear concerning on my exam today. Recently had normal screening mammogram 12/19/2016. Will start with ultrasound to look for underlying ductal pathology and diagnostic mammogram at radiologist's discretion after reviewing her screening mammogram to see if this would add at their decision. Possible referral to general surgeon if persists or certainly any findings on studies.    Anastasio Auerbach MD, 4:02 PM 01/22/2017

## 2017-01-22 NOTE — Patient Instructions (Signed)
The breast center will call you to arrange for the breast ultrasound and possible mammogram. Call my office if you do not hear from them within 1 week.

## 2017-01-24 ENCOUNTER — Telehealth: Payer: Self-pay | Admitting: *Deleted

## 2017-01-24 DIAGNOSIS — N644 Mastodynia: Secondary | ICD-10-CM

## 2017-01-24 NOTE — Telephone Encounter (Signed)
-----   Message from Anastasio Auerbach, MD sent at 01/22/2017  4:05 PM EDT ----- Schedule right breast ultrasound at the breast center reference right nipple pain. Patient had recent screening mammogram. Request diagnostic mammogram if felt indicated by radiologist.

## 2017-01-24 NOTE — Telephone Encounter (Signed)
Pt scheduled on 01/28/17 @ 12:50pm, pt voicemail not set up to leave a message

## 2017-01-25 ENCOUNTER — Encounter: Payer: Self-pay | Admitting: *Deleted

## 2017-01-25 NOTE — Telephone Encounter (Signed)
Sent pt a my chart message, I called pt on 7/5/,7/6 and her voicemail is not set up to leave a message.

## 2017-01-28 ENCOUNTER — Ambulatory Visit
Admission: RE | Admit: 2017-01-28 | Discharge: 2017-01-28 | Disposition: A | Discharge status: Home / Self Care | Payer: BLUE CROSS/BLUE SHIELD | Source: Ambulatory Visit | Attending: Gynecology | Admitting: Gynecology

## 2017-01-28 DIAGNOSIS — R928 Other abnormal and inconclusive findings on diagnostic imaging of breast: Secondary | ICD-10-CM | POA: Diagnosis not present

## 2017-01-28 DIAGNOSIS — N644 Mastodynia: Secondary | ICD-10-CM

## 2017-01-28 DIAGNOSIS — N6489 Other specified disorders of breast: Secondary | ICD-10-CM | POA: Diagnosis not present

## 2017-01-29 NOTE — Telephone Encounter (Signed)
Pt was aware

## 2017-02-02 DIAGNOSIS — L259 Unspecified contact dermatitis, unspecified cause: Secondary | ICD-10-CM | POA: Diagnosis not present

## 2017-02-12 DIAGNOSIS — E785 Hyperlipidemia, unspecified: Secondary | ICD-10-CM | POA: Insufficient documentation

## 2017-02-12 DIAGNOSIS — K219 Gastro-esophageal reflux disease without esophagitis: Secondary | ICD-10-CM | POA: Insufficient documentation

## 2017-02-12 DIAGNOSIS — E039 Hypothyroidism, unspecified: Secondary | ICD-10-CM | POA: Insufficient documentation

## 2017-02-13 ENCOUNTER — Ambulatory Visit (INDEPENDENT_AMBULATORY_CARE_PROVIDER_SITE_OTHER): Payer: BLUE CROSS/BLUE SHIELD | Admitting: Internal Medicine

## 2017-02-13 ENCOUNTER — Encounter: Payer: Self-pay | Admitting: Internal Medicine

## 2017-02-13 VITALS — BP 96/50 | HR 76 | Temp 98.1°F | Ht 62.0 in | Wt 276.1 lb

## 2017-02-13 DIAGNOSIS — E039 Hypothyroidism, unspecified: Secondary | ICD-10-CM | POA: Diagnosis not present

## 2017-02-13 DIAGNOSIS — L739 Follicular disorder, unspecified: Secondary | ICD-10-CM | POA: Diagnosis not present

## 2017-02-13 LAB — TSH: TSH: 2.24 u[IU]/mL (ref 0.35–4.50)

## 2017-02-13 MED ORDER — BETAMETHASONE DIPROPIONATE AUG 0.05 % EX CREA: TOPICAL_CREAM | Freq: Two times a day (BID) | CUTANEOUS | 0 refills | Status: DC

## 2017-02-13 MED ORDER — PREDNISONE 10 MG PO TABS: ORAL_TABLET | ORAL | 0 refills | Status: DC

## 2017-02-13 NOTE — Progress Notes (Signed)
Subjective:    Patient ID: Samantha Smith, female    DOB: Dec 08, 1970, 46 y.o.   MRN: [id number]  DOS:  02/13/2017 Type of visit - description : acute Interval history: Symptoms started 3 weeks ago with a itchy rash on the legs anteriorly. Went to urgent care, received a shot (steroids?) and prescribed Kenalog lotion. Overall felt better but symptoms are not completely gone. Still itching. Good compliance with Synthroid  Review of Systems No fever chills. Denies shaving her legs. Has not being in a whirpool Did  some yard work but used long pants   Past Medical History:  Diagnosis Date  . ASCUS (atypical squamous cells of undetermined significance) on Pap smear 12/2012  . Blurred vision   . Cervical cancer ([place]) 02/2013   Stage IB1 poorly differentiated squamous cell  . 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 Morbid obesity ([place])   . Positive PPD    s/p abx per patient  . Pseudotumor cerebri   . STD (sexually transmitted disease)    Chlamydia history  . Thyroid disease     Past Surgical History:  Procedure Laterality Date  . CHOLECYSTECTOMY  12/01  . LAPAROSCOPIC GASTRIC BANDING  2008   see OV 06/09/08- ?Malfunction  . LAPAROSCOPIC RADICAL TOTAL HYSTERECTOMY W/ NODE BIOPSY, RIGHT SALPINGECTOMY  02/2013   [facility]  . ORIF ANKLE FRACTURE Left 06/16/2014   Procedure: OPEN REDUCTION INTERNAL FIXATION (ORIF) LEFT ANKLE, LISFRANC FRACTURE;  Surgeon: Marianna Payment, MD;  Location: [facility];  Service: Orthopedics;  Laterality: Left;  . SALPINGECTOMY Left 2011   ectopic pregnancy  . TUBAL LIGATION Right 2005    Social History   Social History  . Marital status: Married    Spouse name: N/A  . Number of children: 0  . Years of education: N/A   Occupational History  . Accountant      Social History Main Topics  . Smoking status: Never Smoker  . Smokeless tobacco: Never Used  . Alcohol use No  . Drug use: No  . Sexual activity: Not on file     Comment: 1st intercourse 46  yo-More than 5 partners   Other Topics Concern  . Not on file   Social History Narrative  . No narrative on file      Allergies as of 02/13/2017      Reactions   Codeine Anaphylaxis   Demeclocycline    Other reaction(s): Other (See Comments) Sutotuma?   Tetracyclines & Related Other (See Comments)   Pseudo tumor   Adhesive [tape] Hives      Medication List       Accurate as of 02/13/17 11:59 PM. Always use your most recent med list.          augmented betamethasone dipropionate 0.05 % cream Commonly known as:  DIPROLENE-AF Apply topically 2 (two) times daily.   cetirizine 10 MG tablet Commonly known as:  ZYRTEC Take 10 mg by mouth daily.   levothyroxine 100 MCG tablet Commonly known as:  SYNTHROID, LEVOTHROID Take 1 tablet (100 mcg total) by mouth daily before breakfast.   predniSONE 10 MG tablet Commonly known as:  DELTASONE 3 tabs x 2 days, 2 tabs x 2 days, 1 tab x 2 days   triamcinolone cream 0.5 % Commonly known as:  KENALOG Apply 1 application topically 3 (three) times daily as needed.          Objective:   Physical Exam BP (!) 96/50 (BP Location: Left Arm, Patient  Position: Sitting, Cuff Size: Normal)   Pulse 76   Temp 98.1 F (36.7 C) (Oral)   Ht 5\' 2"  (1.575 m)   Wt 276 lb 2 oz (125.2 kg)   LMP 01/02/2013   SpO2 98%   BMI 50.50 kg/m  General:   Well developed, well nourished . NAD.  HEENT:  Normocephalic . Face symmetric, atraumatic  Lower extremities: Mild left lower extremity edema, chronic since surgery. Skin:  Changes @ the pretibial area more noticeable on the left: Minimal macular hyperpigmentation/erythema  around some of the follicles, no pustules  Neurologic:  alert & oriented X3.  Speech normal, gait appropriate for age and unassisted Psych--  Cognition and judgment appear intact.  Cooperative with normal attention span and concentration.  Behavior appropriate. No anxious or depressed appearing.      Assessment & Plan:     Assessment  Hypothyroid + PPD, s/p antibiotics Cervical cancer, laparoscopic radical total hysterectomy 02-2013 H/o STDs, HPV + Pseudotumor Cerebri- dx 2014, see neuro note 11-06-12 (also saw 2 additional neurologist including Dr Harvel Ricks 11-22-15) Obesity -- gastric banding ~ 2006 H/o vit d def    PLAN: Folliculitis? Seems to have folliculitis, doubt bacterial or fungal. Will continue treatment with oral and topical steroids. Further evaluation needed if not improving. Hypothyroidism: Due for a TSH, will check. F/u  for a yearly checkup already scheduled

## 2017-02-13 NOTE — Patient Instructions (Signed)
GO TO THE LAB : Get the blood work     GO TO THE FRONT DESK Schedule your next appointment for a  physical exam at your convenience. Fasting  Take prednisone as prescribed by mouth  Use the cream twice a day as needed  Call if not gradually improving

## 2017-02-13 NOTE — Progress Notes (Signed)
Pre visit review using our clinic review tool, if applicable. No additional management support is needed unless otherwise documented below in the visit note. 

## 2017-02-14 NOTE — Assessment & Plan Note (Signed)
Folliculitis? Seems to have folliculitis, doubt bacterial or fungal. Will continue treatment with oral and topical steroids. Further evaluation needed if not improving. Hypothyroidism: Due for a TSH, will check. F/u  for a yearly checkup already scheduled

## 2017-02-16 ENCOUNTER — Other Ambulatory Visit: Payer: Self-pay | Admitting: Internal Medicine

## 2017-03-11 DIAGNOSIS — M722 Plantar fascial fibromatosis: Secondary | ICD-10-CM | POA: Diagnosis not present

## 2017-03-11 DIAGNOSIS — E669 Obesity, unspecified: Secondary | ICD-10-CM | POA: Diagnosis not present

## 2017-03-11 DIAGNOSIS — E039 Hypothyroidism, unspecified: Secondary | ICD-10-CM | POA: Diagnosis not present

## 2017-03-21 DIAGNOSIS — Z6841 Body Mass Index (BMI) 40.0 and over, adult: Secondary | ICD-10-CM | POA: Diagnosis not present

## 2017-03-21 DIAGNOSIS — E039 Hypothyroidism, unspecified: Secondary | ICD-10-CM | POA: Diagnosis not present

## 2017-03-21 DIAGNOSIS — Z9884 Bariatric surgery status: Secondary | ICD-10-CM | POA: Diagnosis not present

## 2017-03-21 DIAGNOSIS — C539 Malignant neoplasm of cervix uteri, unspecified: Secondary | ICD-10-CM | POA: Diagnosis not present

## 2017-03-21 DIAGNOSIS — E669 Obesity, unspecified: Secondary | ICD-10-CM | POA: Diagnosis not present

## 2017-04-01 ENCOUNTER — Encounter: Payer: Self-pay | Admitting: Internal Medicine

## 2017-04-01 ENCOUNTER — Ambulatory Visit (INDEPENDENT_AMBULATORY_CARE_PROVIDER_SITE_OTHER): Payer: BLUE CROSS/BLUE SHIELD | Admitting: Internal Medicine

## 2017-04-01 VITALS — BP 132/78 | HR 76 | Temp 97.7°F | Resp 14 | Ht 62.0 in | Wt 280.2 lb

## 2017-04-01 DIAGNOSIS — E039 Hypothyroidism, unspecified: Secondary | ICD-10-CM

## 2017-04-01 DIAGNOSIS — Z0000 Encounter for general adult medical examination without abnormal findings: Secondary | ICD-10-CM | POA: Diagnosis not present

## 2017-04-01 DIAGNOSIS — Z9884 Bariatric surgery status: Secondary | ICD-10-CM

## 2017-04-01 LAB — LIPID PANEL
Cholesterol: 166 mg/dL (ref 0–200)
HDL: 46.7 mg/dL (ref >39.00)
LDL Cholesterol: 92 mg/dL (ref 0–99)
NonHDL: 119.21
TRIGLYCERIDES: 137 mg/dL (ref 0.0–149.0)
Total CHOL/HDL Ratio: 4
VLDL: 27.4 mg/dL (ref 0.0–40.0)

## 2017-04-01 NOTE — Assessment & Plan Note (Signed)
Hypothyroidism: Good compliance with meds, last TSH satisfactory, recheck in 6 months. Morbid obesity: Currently not doing any specific diet, she does not exercise regularly. We had a long conversation about the issue, rec  to see the healthy weight and wellness clinic for obesity management. Not interested in having further bariatric surgery, see below S/P gastric banding: Would like band removed, refer to general surgery. RTC one year, CPX ( but TSH in 6 months)

## 2017-04-01 NOTE — Assessment & Plan Note (Addendum)
-  Td 09; Pneumonia 2016 -Female care:  per gynecology, personal h/o cervical cancer + FH breast ca: Mammogram 01-2017 -[facility] History of remote the scope, no report  Labs reviewed, will get a FLP Diet and exercise discussed

## 2017-04-01 NOTE — Progress Notes (Signed)
Subjective:    Patient ID: Samantha Smith, female    DOB: [birth year], 46 y.o.   MRN: [id number]  DOS:  04/01/2017 Type of visit - description : cpx Interval history: No major concerns, feels at baseline   Review of Systems Had a brief dizziness episode  2 weeks ago, not associated headache, nausea, vomiting, diplopia or facial numbness. Symptoms self resolved. She did have some ear discomfort at the time.  Other than above, a 14 point review of systems is negative     Past Medical History:  Diagnosis Date  . ASCUS (atypical squamous cells of undetermined significance) on Pap smear 12/2012  . Blurred vision   . Cervical cancer ([place]) 02/2013   Stage IB1 poorly differentiated squamous cell  . 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 Morbid obesity ([place])   . Positive PPD    s/p abx per patient  . Pseudotumor cerebri   . STD (sexually transmitted disease)    Chlamydia history  . Thyroid disease     Past Surgical History:  Procedure Laterality Date  . CHOLECYSTECTOMY  12/01  . LAPAROSCOPIC GASTRIC BANDING  2008   see OV 06/09/08- ?Malfunction  . LAPAROSCOPIC RADICAL TOTAL HYSTERECTOMY W/ NODE BIOPSY, RIGHT SALPINGECTOMY  02/2013   [facility]  . ORIF ANKLE FRACTURE Left 06/16/2014   Procedure: OPEN REDUCTION INTERNAL FIXATION (ORIF) LEFT ANKLE, LISFRANC FRACTURE;  Surgeon: Marianna Payment, MD;  Location: [place];  Service: Orthopedics;  Laterality: Left;  . SALPINGECTOMY Left 2011   ectopic pregnancy  . TUBAL LIGATION Right 2005    Social History   Social History  . Marital status: Married    Spouse name: N/A  . Number of children: 0  . Years of education: N/A   Occupational History  . Accountant      Social History Main Topics  . Smoking status: Never Smoker  . Smokeless tobacco: Never Used  . Alcohol use No  . Drug use: No  . Sexual activity: Not on file     Comment: 1st intercourse 46 yo-More than 5 partners   Other Topics Concern  . Not on file   Social History  Narrative  . No narrative on file     Family History  Problem Relation Age of Onset  . Hypertension Father   . Breast cancer Mother 16       [name]  . Heart disease Sister   . Diabetes Maternal Grandmother   . Colon cancer Paternal Grandfather        dx age 44s?  . Breast cancer Paternal Grandmother   . Coronary artery disease Neg Hx   . Stroke Neg Hx      Allergies as of 04/01/2017      Reactions   Codeine Anaphylaxis   Sulfasalazine Other (See Comments), Rash   blisters   Demeclocycline    Other reaction(s): Other (See Comments) Sutotuma?   Tetracyclines & Related Other (See Comments)   Pseudo tumor   Adhesive [tape] Hives      Medication List       Accurate as of 04/01/17  6:46 PM. Always use your most recent med list.          ARMOUR THYROID 60 MG tablet Generic drug:  thyroid Take 60 mg by mouth daily.   cetirizine 10 MG tablet Commonly known as:  ZYRTEC Take 10 mg by mouth daily.            Discharge Care  Instructions        Start     Ordered   04/01/17 0000  Lipid panel     04/01/17 0942   04/01/17 0000  TSH     04/01/17 0942   04/01/17 0000  Ambulatory referral to General Surgery    Comments:  Requesting removal of gastric band (placed in MX)  Question:  What is the reason for referral?  Answer:  Bariatric Surgery   04/01/17 0946         Objective:   Physical Exam BP 132/78 (BP Location: Left Arm, Patient Position: Sitting, Cuff Size: Normal)   Pulse 76   Temp 97.7 F (36.5 C) (Oral)   Resp 14   Ht 5\' 2"  (1.575 m)   Wt 280 lb 4 oz (127.1 kg)   LMP 01/02/2013   SpO2 96%   BMI 51.26 kg/m   General:   Well developed, morbidly obese appearing . NAD.  Neck: No  thyromegaly  HEENT:  Normocephalic . Face symmetric, atraumatic Lungs:  CTA B Normal respiratory effort, no intercostal retractions, no accessory muscle use. Heart: RRR,  no murmur.  No pretibial edema bilaterally  Abdomen:  Not distended, soft,  non-tender. No rebound or rigidity.   Skin: Exposed areas without rash. Not pale. Not jaundice Neurologic:  alert & oriented X3.  Speech normal, gait appropriate for age and unassisted Strength symmetric and appropriate for age.  Psych: Cognition and judgment appear intact.  Cooperative with normal attention span and concentration.  Behavior appropriate. No anxious or depressed appearing.    Assessment & Plan:   Assessment  Hypothyroid + PPD, s/p antibiotics Cervical cancer, laparoscopic radical total hysterectomy 02-2013 H/o STDs, HPV + Pseudotumor Cerebri- dx 2014, see neuro note 11-06-12 (also saw 2 additional neurologist including Dr Harvel Ricks 11-22-15) Morbid Obesity -- gastric banding ~ 2006 ([place]) H/o vit d def    PLAN: Hypothyroidism: Good compliance with meds, last TSH satisfactory, recheck in 6 months. Morbid obesity: Currently not doing any specific diet, she does not exercise regularly. We had a long conversation about the issue, rec  to see the healthy weight and wellness clinic for obesity management. Not interested in having further bariatric surgery, see below S/P gastric banding: Would like band removed, refer to general surgery. RTC one year, CPX ( but TSH in 6 months)

## 2017-04-01 NOTE — Patient Instructions (Signed)
GO TO THE LAB : Get the blood work     GO TO THE FRONT DESK  Schedule labs to be done 6 months from today, TSH  Schedule your next appointment for a  physical exam in one year

## 2017-04-01 NOTE — Progress Notes (Signed)
Pre visit review using our clinic review tool, if applicable. No additional management support is needed unless otherwise documented below in the visit note. 

## 2017-04-23 ENCOUNTER — Ambulatory Visit (INDEPENDENT_AMBULATORY_CARE_PROVIDER_SITE_OTHER): Payer: BLUE CROSS/BLUE SHIELD

## 2017-04-23 ENCOUNTER — Ambulatory Visit (INDEPENDENT_AMBULATORY_CARE_PROVIDER_SITE_OTHER): Payer: BLUE CROSS/BLUE SHIELD | Admitting: Orthopaedic Surgery

## 2017-04-23 ENCOUNTER — Encounter (INDEPENDENT_AMBULATORY_CARE_PROVIDER_SITE_OTHER): Payer: Self-pay | Admitting: Orthopaedic Surgery

## 2017-04-23 DIAGNOSIS — M1712 Unilateral primary osteoarthritis, left knee: Secondary | ICD-10-CM

## 2017-04-23 NOTE — Progress Notes (Signed)
Office Visit Note   Patient: Samantha Smith           Date of Birth: Oct 24, 1970           MRN: [id number] Visit Date: 04/23/2017              Requested by: Colon Branch, [address] [place], [place] [address] PCP: Colon Branch, MD   Assessment & Plan: Visit Diagnoses:  1. Primary osteoarthritis of left knee     Plan: Overall impression is patellofemoral arthritis and effusion. Patient is significantly overweight due to thyroid issues. She declined knee aspiration and injection. She will continue to take meloxicam. Questions encouraged and answered. Follow-up as needed.  Follow-Up Instructions: Return if symptoms worsen or fail to improve.   Orders:  Orders Placed This Encounter  Procedures  . XR KNEE 3 VIEW LEFT   No orders of the defined types were placed in this encounter.     Procedures: No procedures performed   Clinical Data: No additional findings.   Subjective: Chief Complaint  Patient presents with  . Left Knee - Pain    Patient is a 46 year old female comes to me for left knee pain for a year. I previously taken care of her for foot and ankle fractures. She states that she does have swelling and popping of the left knee that's worse with ascending stairs. She does also endorses stiffness. She endorses discomfort in the back of the knee and in the suprapatellar region. Denies any numbness and tingling.    Review of Systems  Constitutional: Negative.   HENT: Negative.   Eyes: Negative.   Respiratory: Negative.   Cardiovascular: Negative.   Endocrine: Negative.   Musculoskeletal: Negative.   Neurological: Negative.   Hematological: Negative.   Psychiatric/Behavioral: Negative.   All other systems reviewed and are negative.    Objective: Vital Signs: LMP 01/02/2013   Physical Exam  Constitutional: She is oriented to person, place, and time. She appears well-developed and well-nourished.  Pulmonary/Chest: Effort normal.    Neurological: She is alert and oriented to person, place, and time.  Skin: Skin is warm. Capillary refill takes less than 2 seconds.  Psychiatric: She has a normal mood and affect. Her behavior is normal. Judgment and thought content normal.  Nursing note and vitals reviewed.   Ortho Exam Left knee exam shows a small effusion collaterals and cruciates are stable. Patellar tracking is normal. Positive patellar crepitus. Specialty Comments:  No specialty comments available.  Imaging: Xr Knee 3 View Left  Result Date: 04/23/2017 Mild osteoarthritis    PMFS History: Patient Active Problem List   Diagnosis Date Noted  . Hypothyroidism 02/12/2017  . Gastroesophageal reflux disease without esophagitis 02/12/2017  . Hyperlipidemia 02/12/2017  . PCP NOTES >>>>>>>>>>>>>>>>>>>>>>>>>>>>>>>>>>>>>>> 04/06/2015  . Annual physical exam 12/27/2014  . Bilateral ankle fractures 06/13/2014  . Cervical cancer ([place]) 02/26/2013  . Skin lesion 11/10/2012  . Positive PPD   . Pseudotumor cerebri   . Morbid obesity ([place])   . Paresthesias 02/06/2011  . Vitamin D deficiency 02/03/2007  . PSTPRC STATUS, BARIATRIC SURGERY 02/03/2007   Past Medical History:  Diagnosis Date  . ASCUS (atypical squamous cells of undetermined significance) on Pap smear 12/2012  . Blurred vision   . Cervical cancer ([place]) 02/2013   Stage IB1 poorly differentiated squamous cell  . 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 Morbid obesity ([place])   . Positive PPD    s/p abx  per patient  . Pseudotumor cerebri   . STD (sexually transmitted disease)    Chlamydia history  . Thyroid disease     Family History  Problem Relation Age of Onset  . Hypertension Father   . Breast cancer Mother 66       [name]  . Heart disease Sister   . Diabetes Maternal Grandmother   . Colon cancer Paternal Grandfather        dx age 75s?  . Breast cancer Paternal Grandmother   . Coronary artery disease Neg Hx   . Stroke Neg Hx      Past Surgical History:  Procedure Laterality Date  . CHOLECYSTECTOMY  12/01  . LAPAROSCOPIC GASTRIC BANDING  2008   see OV 06/09/08- ?Malfunction  . LAPAROSCOPIC RADICAL TOTAL HYSTERECTOMY W/ NODE BIOPSY, RIGHT SALPINGECTOMY  02/2013   [facility]  . ORIF ANKLE FRACTURE Left 06/16/2014   Procedure: OPEN REDUCTION INTERNAL FIXATION (ORIF) LEFT ANKLE, LISFRANC FRACTURE;  Surgeon: Marianna Payment, MD;  Location: [facility];  Service: Orthopedics;  Laterality: Left;  . SALPINGECTOMY Left 2011   ectopic pregnancy  . TUBAL LIGATION Right 2005   Social History   Occupational History  . Accountant      Social History Main Topics  . Smoking status: Never Smoker  . Smokeless tobacco: Never Used  . Alcohol use No  . Drug use: No  . Sexual activity: Not on file     Comment: 1st intercourse 46 yo-More than 5 partners

## 2017-05-11 DIAGNOSIS — Z23 Encounter for immunization: Secondary | ICD-10-CM | POA: Diagnosis not present

## 2017-05-27 DIAGNOSIS — E039 Hypothyroidism, unspecified: Secondary | ICD-10-CM | POA: Diagnosis not present

## 2017-05-27 DIAGNOSIS — D229 Melanocytic nevi, unspecified: Secondary | ICD-10-CM | POA: Diagnosis not present

## 2017-05-27 DIAGNOSIS — E559 Vitamin D deficiency, unspecified: Secondary | ICD-10-CM | POA: Diagnosis not present

## 2017-05-27 DIAGNOSIS — E785 Hyperlipidemia, unspecified: Secondary | ICD-10-CM | POA: Diagnosis not present

## 2017-05-27 DIAGNOSIS — Z0000 Encounter for general adult medical examination without abnormal findings: Secondary | ICD-10-CM | POA: Diagnosis not present

## 2017-05-28 DIAGNOSIS — E785 Hyperlipidemia, unspecified: Secondary | ICD-10-CM | POA: Diagnosis not present

## 2017-05-28 DIAGNOSIS — E559 Vitamin D deficiency, unspecified: Secondary | ICD-10-CM | POA: Diagnosis not present

## 2017-05-28 DIAGNOSIS — Z0000 Encounter for general adult medical examination without abnormal findings: Secondary | ICD-10-CM | POA: Diagnosis not present

## 2017-05-28 DIAGNOSIS — E039 Hypothyroidism, unspecified: Secondary | ICD-10-CM | POA: Diagnosis not present

## 2017-08-28 DIAGNOSIS — Z9884 Bariatric surgery status: Secondary | ICD-10-CM | POA: Diagnosis not present

## 2017-08-28 DIAGNOSIS — Z6841 Body Mass Index (BMI) 40.0 and over, adult: Secondary | ICD-10-CM | POA: Diagnosis not present

## 2017-08-29 ENCOUNTER — Other Ambulatory Visit: Payer: Self-pay | Admitting: Surgical Oncology

## 2017-08-29 DIAGNOSIS — K9509 Other complications of gastric band procedure: Secondary | ICD-10-CM

## 2017-09-11 DIAGNOSIS — D2372 Other benign neoplasm of skin of left lower limb, including hip: Secondary | ICD-10-CM | POA: Diagnosis not present

## 2017-09-11 DIAGNOSIS — D225 Melanocytic nevi of trunk: Secondary | ICD-10-CM | POA: Diagnosis not present

## 2017-09-30 DIAGNOSIS — R635 Abnormal weight gain: Secondary | ICD-10-CM | POA: Diagnosis not present

## 2017-09-30 DIAGNOSIS — R948 Abnormal results of function studies of other organs and systems: Secondary | ICD-10-CM | POA: Diagnosis not present

## 2017-09-30 DIAGNOSIS — Z6841 Body Mass Index (BMI) 40.0 and over, adult: Secondary | ICD-10-CM | POA: Diagnosis not present

## 2017-10-05 DIAGNOSIS — M25512 Pain in left shoulder: Secondary | ICD-10-CM | POA: Diagnosis not present

## 2017-10-10 DIAGNOSIS — Z9884 Bariatric surgery status: Secondary | ICD-10-CM | POA: Diagnosis not present

## 2017-10-10 DIAGNOSIS — Z6841 Body Mass Index (BMI) 40.0 and over, adult: Secondary | ICD-10-CM | POA: Diagnosis not present

## 2017-10-10 DIAGNOSIS — Z79899 Other long term (current) drug therapy: Secondary | ICD-10-CM | POA: Diagnosis not present

## 2017-10-27 ENCOUNTER — Other Ambulatory Visit: Payer: Self-pay

## 2017-10-27 ENCOUNTER — Emergency Department (HOSPITAL_BASED_OUTPATIENT_CLINIC_OR_DEPARTMENT_OTHER)
Admission: EM | Admit: 2017-10-27 | Discharge: 2017-10-27 | Disposition: A | Discharge status: Home / Self Care | Payer: BLUE CROSS/BLUE SHIELD | Attending: Emergency Medicine | Admitting: Emergency Medicine

## 2017-10-27 ENCOUNTER — Encounter (HOSPITAL_BASED_OUTPATIENT_CLINIC_OR_DEPARTMENT_OTHER): Payer: Self-pay | Admitting: Emergency Medicine

## 2017-10-27 DIAGNOSIS — K5791 Diverticulosis of intestine, part unspecified, without perforation or abscess with bleeding: Secondary | ICD-10-CM | POA: Diagnosis not present

## 2017-10-27 DIAGNOSIS — Z5321 Procedure and treatment not carried out due to patient leaving prior to being seen by health care provider: Secondary | ICD-10-CM | POA: Diagnosis not present

## 2017-10-27 DIAGNOSIS — R1032 Left lower quadrant pain: Secondary | ICD-10-CM | POA: Insufficient documentation

## 2017-10-27 LAB — URINALYSIS, ROUTINE W REFLEX MICROSCOPIC
Bilirubin Urine: NEGATIVE
GLUCOSE, UA: NEGATIVE mg/dL
HGB URINE DIPSTICK: NEGATIVE
Ketones, ur: NEGATIVE mg/dL
LEUKOCYTES UA: NEGATIVE
Nitrite: NEGATIVE
PROTEIN: NEGATIVE mg/dL
pH: 7 (ref 5.0–8.0)

## 2017-10-27 NOTE — ED Triage Notes (Signed)
LLQ pain since Friday, sent by Urgent Care for CT for ? Diverticulitis .

## 2017-10-27 NOTE — ED Notes (Signed)
Called x 2 in waiting room

## 2017-10-27 NOTE — ED Notes (Signed)
No answer in waiting room 

## 2017-10-29 ENCOUNTER — Ambulatory Visit (HOSPITAL_BASED_OUTPATIENT_CLINIC_OR_DEPARTMENT_OTHER)
Admission: RE | Admit: 2017-10-29 | Discharge: 2017-10-29 | Disposition: A | Discharge status: Home / Self Care | Payer: BLUE CROSS/BLUE SHIELD | Source: Ambulatory Visit | Attending: Internal Medicine | Admitting: Internal Medicine

## 2017-10-29 ENCOUNTER — Encounter: Payer: Self-pay | Admitting: Internal Medicine

## 2017-10-29 ENCOUNTER — Encounter (HOSPITAL_BASED_OUTPATIENT_CLINIC_OR_DEPARTMENT_OTHER): Payer: Self-pay

## 2017-10-29 ENCOUNTER — Ambulatory Visit: Payer: BLUE CROSS/BLUE SHIELD | Admitting: Internal Medicine

## 2017-10-29 VITALS — BP 124/78 | HR 87 | Temp 98.1°F | Resp 18 | Ht 62.0 in | Wt 271.4 lb

## 2017-10-29 DIAGNOSIS — K5732 Diverticulitis of large intestine without perforation or abscess without bleeding: Secondary | ICD-10-CM | POA: Insufficient documentation

## 2017-10-29 DIAGNOSIS — Z90710 Acquired absence of both cervix and uterus: Secondary | ICD-10-CM | POA: Diagnosis not present

## 2017-10-29 DIAGNOSIS — R109 Unspecified abdominal pain: Secondary | ICD-10-CM | POA: Diagnosis not present

## 2017-10-29 DIAGNOSIS — K5792 Diverticulitis of intestine, part unspecified, without perforation or abscess without bleeding: Secondary | ICD-10-CM

## 2017-10-29 DIAGNOSIS — R1012 Left upper quadrant pain: Secondary | ICD-10-CM | POA: Insufficient documentation

## 2017-10-29 DIAGNOSIS — Z9049 Acquired absence of other specified parts of digestive tract: Secondary | ICD-10-CM | POA: Diagnosis not present

## 2017-10-29 DIAGNOSIS — Z9884 Bariatric surgery status: Secondary | ICD-10-CM | POA: Diagnosis not present

## 2017-10-29 LAB — URINALYSIS, ROUTINE W REFLEX MICROSCOPIC
HGB URINE DIPSTICK: NEGATIVE
Ketones, ur: NEGATIVE
Leukocytes, UA: NEGATIVE
Nitrite: NEGATIVE
PH: 7 (ref 5.0–8.0)
RBC / HPF: NONE SEEN (ref 0–?)
Specific Gravity, Urine: 1.01 (ref 1.000–1.030)
TOTAL PROTEIN, URINE-UPE24: NEGATIVE
Urine Glucose: NEGATIVE
Urobilinogen, UA: 2 — AB (ref 0.0–1.0)

## 2017-10-29 LAB — CBC WITH DIFFERENTIAL/PLATELET
Basophils Absolute: 0.1 10*3/uL (ref 0.0–0.1)
Basophils Relative: 1.1 % (ref 0.0–3.0)
EOS PCT: 2.6 % (ref 0.0–5.0)
Eosinophils Absolute: 0.2 10*3/uL (ref 0.0–0.7)
HCT: 43.3 % (ref 36.0–46.0)
Hemoglobin: 14.4 g/dL (ref 12.0–15.0)
LYMPHS ABS: 1.9 10*3/uL (ref 0.7–4.0)
Lymphocytes Relative: 20.2 % (ref 12.0–46.0)
MCHC: 33.2 g/dL (ref 30.0–36.0)
MCV: 95.2 fl (ref 78.0–100.0)
MONOS PCT: 5.5 % (ref 3.0–12.0)
Monocytes Absolute: 0.5 10*3/uL (ref 0.1–1.0)
NEUTROS ABS: 6.5 10*3/uL (ref 1.4–7.7)
NEUTROS PCT: 70.6 % (ref 43.0–77.0)
PLATELETS: 283 10*3/uL (ref 150.0–400.0)
RBC: 4.55 Mil/uL (ref 3.87–5.11)
RDW: 14.1 % (ref 11.5–15.5)
WBC: 9.3 10*3/uL (ref 4.0–10.5)

## 2017-10-29 LAB — HEMOCCULT GUIAC POC 1CARD (OFFICE): Fecal Occult Blood, POC: NEGATIVE

## 2017-10-29 LAB — BASIC METABOLIC PANEL
BUN: 8 mg/dL (ref 6–23)
CO2: 28 meq/L (ref 19–32)
Calcium: 9 mg/dL (ref 8.4–10.5)
Chloride: 103 mEq/L (ref 96–112)
Creatinine, Ser: 0.66 mg/dL (ref 0.40–1.20)
GFR: 102.47 mL/min (ref >60.00)
GLUCOSE: 102 mg/dL — AB (ref 70–99)
POTASSIUM: 3.9 meq/L (ref 3.5–5.1)
SODIUM: 139 meq/L (ref 135–145)

## 2017-10-29 MED ORDER — IOPAMIDOL (ISOVUE-300) INJECTION 61%
100.0000 mL | Freq: Once | INTRAVENOUS | Status: AC | PRN
  Administered 2017-10-29: 100 mL via INTRAVENOUS

## 2017-10-29 MED ORDER — CIPROFLOXACIN HCL 500 MG PO TABS: 500.0000 mg | ORAL_TABLET | Freq: Two times a day (BID) | ORAL | 0 refills | Status: AC

## 2017-10-29 MED ORDER — METRONIDAZOLE 500 MG PO TABS: 500.0000 mg | ORAL_TABLET | Freq: Three times a day (TID) | ORAL | 0 refills | Status: AC

## 2017-10-29 NOTE — Progress Notes (Signed)
Pre visit review using our clinic review tool, if applicable. No additional management support is needed unless otherwise documented below in the visit note. 

## 2017-10-29 NOTE — Progress Notes (Signed)
Subjective:    Patient ID: Samantha Smith, female    DOB: January 02, 1971, 47 y.o.   MRN: [id number]  DOS:  10/29/2017 Type of visit - description : acute Interval history: Symptoms started 10/25/2017 after she ate tacos: Had a discomfort at the center of the abdomen. The next day, she felt worse, the abdominal discomfort was initially at the upper abdomen and eventually located at the left upper quadrant.  Bowel sounds were increased, she did notice increase on gas (belching and flatulence).  She had on and off mucus/small amount of blood from the rectum. She went to urgent care, x-rays showed "No blockage", was prescribed Cipro and Flagyl and was recommended to go to the ER for further eval (CT). At the ER, UA was negative but she left before seen by the doctor. Since then she continue with about the same symptoms, pain mostly located at the left upper quadrant of the abdomen, took Cipro and Flagyl 2 doses, but no abx today  Eventually yesterday she had a large BM with brown stools apparently surrounded by dry blood.  Since then she feels better but not completely well. Currently with no nausea, no other BMs since yesterday.   Wt Readings from Last 3 Encounters:  10/29/17 271 lb 6 oz (123.1 kg)  10/27/17 271 lb (122.9 kg)  04/01/17 280 lb 4 oz (127.1 kg)     Review of Systems Did have a temperature of 99.0 and some sweats during the last few days. Had nausea initially, vomited once Appetite is slightly decreased No cough  Past Medical History:  Diagnosis Date  . ASCUS (atypical squamous cells of undetermined significance) on Pap smear 12/2012  . Blurred vision   . Cervical cancer ([place]) 02/2013   Stage IB1 poorly differentiated squamous cell  . 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 Morbid obesity ([place])   . Positive PPD    s/p abx per patient  . Pseudotumor cerebri   . STD (sexually transmitted disease)    Chlamydia history  . Thyroid disease     Past Surgical History:  Procedure  Laterality Date  . CHOLECYSTECTOMY  12/01  . LAPAROSCOPIC GASTRIC BANDING  2008   see OV 06/09/08- ?Malfunction  . LAPAROSCOPIC RADICAL TOTAL HYSTERECTOMY W/ NODE BIOPSY, RIGHT SALPINGECTOMY  02/2013   [facility]  . ORIF ANKLE FRACTURE Left 06/16/2014   Procedure: OPEN REDUCTION INTERNAL FIXATION (ORIF) LEFT ANKLE, LISFRANC FRACTURE;  Surgeon: Marianna Payment, MD;  Location: [place];  Service: Orthopedics;  Laterality: Left;  . SALPINGECTOMY Left 2011   ectopic pregnancy  . TUBAL LIGATION Right 2005    Social History   Socioeconomic History  . Marital status: Married    Spouse name: Not on file  . Number of children: 0  . Years of education: Not on file  . Highest education level: Not on file  Occupational History  . Occupation: Optometrist    Social Needs  . Financial resource strain: Not on file  . Food insecurity:    Worry: Not on file    Inability: Not on file  . Transportation needs:    Medical: Not on file    Non-medical: Not on file  Tobacco Use  . Smoking status: Never Smoker  . Smokeless tobacco: Never Used  Substance and Sexual Activity  . Alcohol use: No    Alcohol/week: 0.0 oz  . Drug use: No  . Sexual activity: Not on file    Comment: 1st intercourse 47 yo-More than 5  partners  Lifestyle  . Physical activity:    Days per week: Not on file    Minutes per session: Not on file  . Stress: Not on file  Relationships  . Social connections:    Talks on phone: Not on file    Gets together: Not on file    Attends religious service: Not on file    Active member of club or organization: Not on file    Attends meetings of clubs or organizations: Not on file    Relationship status: Not on file  . Intimate partner violence:    Fear of current or ex partner: Not on file    Emotionally abused: Not on file    Physically abused: Not on file    Forced sexual activity: Not on file  Other Topics Concern  . Not on file  Social History Narrative  . Not on file       Allergies as of 10/29/2017      Reactions   Codeine Anaphylaxis   Sulfasalazine Other (See Comments), Rash   blisters   Demeclocycline    Other reaction(s): Other (See Comments) Sutotuma?   Tetracyclines & Related Other (See Comments)   Pseudo tumor   Adhesive [tape] Hives      Medication List        Accurate as of 10/29/17  9:40 AM. Always use your most recent med list.          ARMOUR THYROID 60 MG tablet Generic drug:  thyroid Take 60 mg by mouth daily.   cetirizine 10 MG tablet Commonly known as:  ZYRTEC Take 10 mg by mouth daily.   ciprofloxacin 500 MG tablet Commonly known as:  CIPRO Take 500 mg by mouth 2 (two) times daily.   metroNIDAZOLE 500 MG tablet Commonly known as:  FLAGYL Take 500 mg by mouth 2 (two) times daily.          Objective:   Physical Exam  Abdominal:     BP 124/78 (BP Location: Left Arm, Patient Position: Sitting, Cuff Size: Normal)   Pulse 87   Temp 98.1 F (36.7 C) (Oral)   Resp 18   Ht 5\' 2"  (1.575 m)   Wt 271 lb 6 oz (123.1 kg)   LMP 01/02/2013   SpO2 98%   BMI 49.64 kg/m  General:   Well developed, well nourished . NAD.  HEENT:  Normocephalic . Face symmetric, atraumatic.  Not pale, no jaundice Lungs:  CTA B Normal respiratory effort, no intercostal retractions, no accessory muscle use. Heart: RRR,  no murmur.  no pretibial edema bilaterally  Abdomen:  Not distended, soft, slightly tender at the left side.  See graphic.  No mass or rebound DRE: Normal sphincter tone, no mass, trace amount of yellow stools found, Hemoccult negative.  No obvious blood or mucus Skin: Not pale. Not jaundice Neurologic:  alert & oriented X3.  Speech normal, gait appropriate for age and unassisted Psych--  Cognition and judgment appear intact.  Cooperative with normal attention span and concentration.  Behavior appropriate. No anxious or depressed appearing.     Assessment & Plan:   Assessment  Hypothyroid + PPD, s/p  antibiotics Cervical cancer, laparoscopic radical total hysterectomy 02-2013 H/o STDs, HPV + Pseudotumor Cerebri- dx 2014, see neuro note 11-06-12 (also saw 2 additional neurologist including Dr Harvel Ricks 11-22-15) Morbid Obesity -- gastric banding ~ 2006 ([place]) H/o vit d def    PLAN: Abdominal pain, left-sided: She has a history of gastric  banding in 2008, cholecystectomy, hysterectomy bilateral salpingectomy, presents w/ above sxs Does not look toxic, no acute abdomen. DDX includes diverticulitis, colitis, resolving SBO, all others. We will get a CBC, CMP, UA, urine culture. CT abdomen and pelvis with contrast.  Last creatinine normal Patient has a number of questions that were answered to the best of my ability, further advise with results Addendum:  CT report consistent with diverticulitis. Recommend Cipro 500 mg twice daily x10 days Flagyl 500 mg 3 times daily x10 days. See CT report Come back in 4 weeks.  Sooner if worse

## 2017-10-29 NOTE — Patient Instructions (Signed)
Get your blood work  Go downstairs to the x-ray department about your CAT scan  Hold antibiotics until the CAT scan results are ready

## 2017-10-29 NOTE — Assessment & Plan Note (Signed)
Abdominal pain, left-sided: She has a history of gastric banding in 2008, cholecystectomy, hysterectomy bilateral salpingectomy, presents w/ above sxs Does not look toxic, no acute abdomen. DDX includes diverticulitis, colitis, resolving SBO, all others. We will get a CBC, CMP, UA, urine culture. CT abdomen and pelvis with contrast.  Last creatinine normal Patient has a number of questions that were answered to the best of my ability, further advise with results Addendum:  CT report consistent with diverticulitis. Recommend Cipro 500 mg twice daily x10 days Flagyl 500 mg 3 times daily x10 days. See CT report Come back in 4 weeks.  Sooner if worse

## 2017-10-30 LAB — URINE CULTURE
MICRO NUMBER:: 90436035
Result:: NO GROWTH
SPECIMEN QUALITY:: ADEQUATE

## 2017-11-21 ENCOUNTER — Encounter: Payer: Self-pay | Admitting: Gynecology

## 2017-11-21 ENCOUNTER — Ambulatory Visit: Payer: BLUE CROSS/BLUE SHIELD | Admitting: Gynecology

## 2017-11-21 VITALS — BP 124/82 | Ht 62.0 in | Wt 273.0 lb

## 2017-11-21 DIAGNOSIS — Z01419 Encounter for gynecological examination (general) (routine) without abnormal findings: Secondary | ICD-10-CM

## 2017-11-21 DIAGNOSIS — E039 Hypothyroidism, unspecified: Secondary | ICD-10-CM | POA: Diagnosis not present

## 2017-11-21 DIAGNOSIS — Z8541 Personal history of malignant neoplasm of cervix uteri: Secondary | ICD-10-CM

## 2017-11-21 NOTE — Progress Notes (Signed)
    JAONNA WORD [birth year] [id number]        47 y.o.  G3P0030 for annual gynecologic exam.  Doing well without complaints.  Past medical history,surgical history, problem list, medications, allergies, family history and social history were all reviewed and documented as reviewed in the EPIC chart.  ROS:  Performed with pertinent positives and negatives included in the history, assessment and plan.   Additional significant findings : None   Exam: [name] assistant Vitals:   11/21/17 1230  BP: 124/82  Weight: 273 lb (123.8 kg)  Height: 5\' 2"  (1.575 m)   Body mass index is 49.93 kg/m.  General appearance:  Normal affect, orientation and appearance. Skin: Grossly normal HEENT: Without gross lesions.  No cervical or supraclavicular adenopathy. Thyroid normal.  Lungs:  Clear without wheezing, rales or rhonchi Cardiac: RR, without RMG Abdominal:  Soft, nontender, without masses, guarding, rebound, organomegaly or hernia Breasts:  Examined lying and sitting without masses, retractions, discharge or axillary adenopathy. Pelvic:  Ext, BUS, Vagina: Normal.  Pap smear of vaginal cuff done  Adnexa: Without masses or tenderness    Anus and perineum: Normal   Rectovaginal: Normal sphincter tone without palpated masses or tenderness.    Assessment/Plan:  47 y.o. G31P0030 female for annual gynecologic exam.   1. History of stage I B1 cervical cancer status post robotic radical hysterectomy with lymph node dissection 2014 at [facility].  Doing well without complaints.  Exam NED.  Pap smear done. 2. Screening mammography due now and patient reminded to schedule.  History of right nipple pain last July with diagnostic right mammography and ultrasound negative.  Patient notes pain has resolved.  Exam today is normal. 3. Health maintenance.  No routine lab work done as patient does this elsewhere.  Follow-up 1 year, sooner as needed.   Anastasio Auerbach MD, 12:57 PM 11/21/2017

## 2017-11-21 NOTE — Addendum Note (Signed)
Addended by: Nelva Nay on: 11/21/2017 01:02 PM   Modules accepted: Orders

## 2017-11-21 NOTE — Patient Instructions (Addendum)
Follow-up in 1 year for annual exam, sooner if any issues. 

## 2017-11-22 ENCOUNTER — Encounter: Payer: Self-pay | Admitting: Gynecology

## 2017-11-22 LAB — PAP IG W/ RFLX HPV ASCU

## 2017-11-26 ENCOUNTER — Encounter: Payer: Self-pay | Admitting: Internal Medicine

## 2017-11-26 ENCOUNTER — Ambulatory Visit: Payer: BLUE CROSS/BLUE SHIELD | Admitting: Internal Medicine

## 2017-11-26 VITALS — BP 132/68 | HR 72 | Temp 98.1°F | Resp 14 | Ht 62.0 in | Wt 275.0 lb

## 2017-11-26 DIAGNOSIS — K5792 Diverticulitis of intestine, part unspecified, without perforation or abscess without bleeding: Secondary | ICD-10-CM

## 2017-11-26 DIAGNOSIS — K5790 Diverticulosis of intestine, part unspecified, without perforation or abscess without bleeding: Secondary | ICD-10-CM

## 2017-11-26 NOTE — Progress Notes (Signed)
Subjective:    Patient ID: Samantha Smith, female    DOB: 01/08/1971, 47 y.o.   MRN: [id number]  DOS:  11/26/2017 Type of visit - description : Follow-up Interval history: Recently diagnosed with diverticulitis, status post antibiotics, she quickly responded to treatment, currently asymptomatic   Review of Systems Denies fever chills No nausea, vomiting, diarrhea.  No blood in the stools.  Past Medical History:  Diagnosis Date  . ASCUS (atypical squamous cells of undetermined significance) on Pap smear 12/2012  . Blurred vision   . Cervical cancer ([place]) 02/2013   Stage IB1 poorly differentiated squamous cell  . Diverticulitis   . 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 Morbid obesity ([place])   . Positive PPD    s/p abx per patient  . Pseudotumor cerebri   . STD (sexually transmitted disease)    Chlamydia history  . Thyroid disease     Past Surgical History:  Procedure Laterality Date  . CHOLECYSTECTOMY  12/01  . LAPAROSCOPIC GASTRIC BANDING  2008   see OV 06/09/08- ?Malfunction  . LAPAROSCOPIC RADICAL TOTAL HYSTERECTOMY W/ NODE BIOPSY, RIGHT SALPINGECTOMY  02/2013   [facility]  . ORIF ANKLE FRACTURE Left 06/16/2014   Procedure: OPEN REDUCTION INTERNAL FIXATION (ORIF) LEFT ANKLE, LISFRANC FRACTURE;  Surgeon: Marianna Payment, MD;  Location: [place];  Service: Orthopedics;  Laterality: Left;  . SALPINGECTOMY Left 2011   ectopic pregnancy  . TUBAL LIGATION Right 2005    Social History   Socioeconomic History  . Marital status: Married    Spouse name: Not on file  . Number of children: 0  . Years of education: Not on file  . Highest education level: Not on file  Occupational History  . Occupation: Optometrist    Social Needs  . Financial resource strain: Not on file  . Food insecurity:    Worry: Not on file    Inability: Not on file  . Transportation needs:    Medical: Not on file    Non-medical: Not on file  Tobacco Use  . Smoking status: Never Smoker  . Smokeless tobacco:  Never Used  Substance and Sexual Activity  . Alcohol use: No    Alcohol/week: 0.0 oz  . Drug use: No  . Sexual activity: Yes    Birth control/protection: Surgical    Comment: 1st intercourse 47 yo-More than 5 partners  Lifestyle  . Physical activity:    Days per week: Not on file    Minutes per session: Not on file  . Stress: Not on file  Relationships  . Social connections:    Talks on phone: Not on file    Gets together: Not on file    Attends religious service: Not on file    Active member of club or organization: Not on file    Attends meetings of clubs or organizations: Not on file    Relationship status: Not on file  . Intimate partner violence:    Fear of current or ex partner: Not on file    Emotionally abused: Not on file    Physically abused: Not on file    Forced sexual activity: Not on file  Other Topics Concern  . Not on file  Social History Narrative  . Not on file      Allergies as of 11/26/2017      Reactions   Codeine Anaphylaxis   Sulfasalazine Other (See Comments), Rash   blisters   Demeclocycline    Other reaction(s): Other (  See Comments) Sutotuma?   Tetracyclines & Related Other (See Comments)   Pseudo tumor   Adhesive [tape] Hives      Medication List        Accurate as of 11/26/17 11:59 PM. Always use your most recent med list.          ARMOUR THYROID 60 MG tablet Generic drug:  thyroid Take 60 mg by mouth daily.          Objective:   Physical Exam BP 132/68 (BP Location: Left Arm, Patient Position: Sitting, Cuff Size: Normal)   Pulse 72   Temp 98.1 F (36.7 C) (Oral)   Resp 14   Ht 5\' 2"  (1.575 m)   Wt 275 lb (124.7 kg)   LMP 01/02/2013   SpO2 98%   BMI 50.30 kg/m  General:   Well developed, well nourished . NAD.  HEENT:  Normocephalic . Face symmetric, atraumatic   Abdomen:  Not distended, soft, non-tender. No rebound or rigidity.   Skin: Not pale. Not jaundice Neurologic:  alert & oriented X3.  Speech normal,  gait appropriate for age and unassisted Psych--  Cognition and judgment appear intact.  Cooperative with normal attention span and concentration.  Behavior appropriate. No anxious or depressed appearing.     Assessment & Plan:  Assessment  Hypothyroid + PPD, s/p antibiotics Cervical cancer, laparoscopic radical total hysterectomy 02-2013 H/o STDs, HPV + Pseudotumor Cerebri- dx 2014, see neuro note 11-06-12 (also saw 2 additional neurologist including Dr Harvel Ricks 11-22-15) Morbid Obesity -- gastric banding ~ 2006 ([place]) H/o vit d def   PLAN: Diverticulitis: First episode diagnosed few weeks ago based on a CT scan, good response to antibiotics.  Currently asymptomatic.  Long discussion about diet, she needs a fiber rich diet, no need to avoid seeds or peanuts.  During acute episodes, she does need to follow a bland diet.  Recommend to check the [facility] web site for more info. Okay to take Metamucil, Colace as needed, she needs to avoid constipation. Refer to GI for consideration of colonoscopy Also, I agreed to see her sister as a new patient RTC 03-2018 CPX

## 2017-11-26 NOTE — Patient Instructions (Signed)
Next visit for a physical by September 2019    Good information about diverticulitis  The [facility] web site     [email or web address]    Please ask your sister to make an appointment

## 2017-11-26 NOTE — Progress Notes (Signed)
Pre visit review using our clinic review tool, if applicable. No additional management support is needed unless otherwise documented below in the visit note. 

## 2017-11-27 NOTE — Assessment & Plan Note (Signed)
Diverticulitis: First episode diagnosed few weeks ago based on a CT scan, good response to antibiotics.  Currently asymptomatic.  Long discussion about diet, she needs a fiber rich diet, no need to avoid seeds or peanuts.  During acute episodes, she does need to follow a bland diet.  Recommend to check the [facility] web site for more info. Okay to take Metamucil, Colace as needed, she needs to avoid constipation. Refer to GI for consideration of colonoscopy Also, I agreed to see her sister as a new patient RTC 03-2018 CPX

## 2018-01-20 ENCOUNTER — Ambulatory Visit: Payer: BLUE CROSS/BLUE SHIELD | Admitting: Internal Medicine

## 2018-01-24 ENCOUNTER — Encounter: Payer: Self-pay | Admitting: Internal Medicine

## 2018-03-05 ENCOUNTER — Telehealth: Payer: Self-pay

## 2018-03-05 NOTE — Telephone Encounter (Signed)
I am asked to complete adoption paperwork for pt- this is urgent and her PCP is out of town.   Reviewed chart and I am able to sign form for her today

## 2018-03-05 NOTE — Telephone Encounter (Signed)
Received adoption physical form from [employer]- informed Pt that PCP out of office until 8/20- however completed forms to the best of my ability. Informed Pt's husband that he and Pt will need to come by and sign forms for [place] to be able to release records. Form placed at front desk for Pt signature.

## 2018-03-05 NOTE — Telephone Encounter (Signed)
Spoke w/ Pt's husband- aware forms are completed- however we need them to sign agreeing to release medical information. Pt and her husband will be here later today to sign.

## 2018-04-02 ENCOUNTER — Encounter: Payer: BLUE CROSS/BLUE SHIELD | Admitting: Internal Medicine

## 2018-04-10 DIAGNOSIS — E039 Hypothyroidism, unspecified: Secondary | ICD-10-CM | POA: Diagnosis not present

## 2018-04-10 DIAGNOSIS — C539 Malignant neoplasm of cervix uteri, unspecified: Secondary | ICD-10-CM | POA: Diagnosis not present

## 2018-04-10 DIAGNOSIS — Z9884 Bariatric surgery status: Secondary | ICD-10-CM | POA: Diagnosis not present

## 2018-04-10 DIAGNOSIS — Z6841 Body Mass Index (BMI) 40.0 and over, adult: Secondary | ICD-10-CM | POA: Diagnosis not present

## 2018-04-10 DIAGNOSIS — E669 Obesity, unspecified: Secondary | ICD-10-CM | POA: Diagnosis not present

## 2018-04-11 ENCOUNTER — Ambulatory Visit (INDEPENDENT_AMBULATORY_CARE_PROVIDER_SITE_OTHER): Payer: BLUE CROSS/BLUE SHIELD | Admitting: Internal Medicine

## 2018-04-11 ENCOUNTER — Ambulatory Visit (HOSPITAL_BASED_OUTPATIENT_CLINIC_OR_DEPARTMENT_OTHER)
Admission: RE | Admit: 2018-04-11 | Discharge: 2018-04-11 | Disposition: A | Discharge status: Home / Self Care | Payer: BLUE CROSS/BLUE SHIELD | Source: Ambulatory Visit | Attending: Internal Medicine | Admitting: Internal Medicine

## 2018-04-11 ENCOUNTER — Encounter: Payer: Self-pay | Admitting: Internal Medicine

## 2018-04-11 VITALS — BP 124/76 | HR 76 | Temp 97.7°F | Resp 16 | Ht 62.0 in | Wt 273.5 lb

## 2018-04-11 DIAGNOSIS — Z0001 Encounter for general adult medical examination with abnormal findings: Secondary | ICD-10-CM

## 2018-04-11 DIAGNOSIS — Z23 Encounter for immunization: Secondary | ICD-10-CM

## 2018-04-11 DIAGNOSIS — Z0000 Encounter for general adult medical examination without abnormal findings: Secondary | ICD-10-CM

## 2018-04-11 DIAGNOSIS — R1011 Right upper quadrant pain: Secondary | ICD-10-CM | POA: Diagnosis not present

## 2018-04-11 NOTE — Patient Instructions (Addendum)
GO TO THE LAB : Get the blood work     GO TO THE FRONT DESK Schedule your next appointment for a physical exam in 1 year   STOP BY THE FIRST FLOOR:  get the XR    If the pain at the right side gets worse or is not improving please let me know

## 2018-04-11 NOTE — Assessment & Plan Note (Signed)
-  Td 2019; PNM 23 2016; flu shot today -Female care:  per gynecology, personal h/o cervical cancer, reports she saw her gynecologist oncology just today and she was so she was doing very well. + FH breast ca: Mammogram 01-2017, plans to schedule a mammogram.  Clinical breast exam by gynecology -[facility]: Was refer to GI because had a recent diverticulitis, will refer again. Labs : CMP, FLP, CBC, amylase, lipase, TSH, UA, urine culture

## 2018-04-11 NOTE — Progress Notes (Signed)
Pre visit review using our clinic review tool, if applicable. No additional management support is needed unless otherwise documented below in the visit note. 

## 2018-04-11 NOTE — Progress Notes (Signed)
Subjective:    Patient ID: Samantha Smith, female    DOB: 1971/07/09, 47 y.o.   MRN: [id number]  DOS:  04/11/2018 Type of visit - description : CPX Interval history: Here with her husband   Review of Systems  Reports several months history of right upper quadrant and right chest wall pain, initially on and off, now she has more steady, mild pain. Symptoms sometimes increased with fatty foods. Denies any fever, chills.  No urinary symptoms No nausea or vomiting Does not recall any rash there.  Other than above, a 14 point review of systems is negative    Past Medical History:  Diagnosis Date  . ASCUS (atypical squamous cells of undetermined significance) on Pap smear 12/2012  . Blurred vision   . Cervical cancer ([place]) 02/2013   Stage IB1 poorly differentiated squamous cell  . Diverticulitis   . 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 Morbid obesity ([place])   . Positive PPD    s/p abx per patient  . Pseudotumor cerebri   . STD (sexually transmitted disease)    Chlamydia history  . Thyroid disease     Past Surgical History:  Procedure Laterality Date  . CHOLECYSTECTOMY  12/01  . LAPAROSCOPIC GASTRIC BANDING  2008   see OV 06/09/08- ?Malfunction  . LAPAROSCOPIC RADICAL TOTAL HYSTERECTOMY W/ NODE BIOPSY, RIGHT SALPINGECTOMY  02/2013   [facility]  . ORIF ANKLE FRACTURE Left 06/16/2014   Procedure: OPEN REDUCTION INTERNAL FIXATION (ORIF) LEFT ANKLE, LISFRANC FRACTURE;  Surgeon: Marianna Payment, MD;  Location: [place];  Service: Orthopedics;  Laterality: Left;  . SALPINGECTOMY Left 2011   ectopic pregnancy  . TUBAL LIGATION Right 2005    Social History   Socioeconomic History  . Marital status: Married    Spouse name: Not on file  . Number of children: 0  . Years of education: Not on file  . Highest education level: Not on file  Occupational History  . Occupation: Optometrist    Social Needs  . Financial resource strain: Not on file  . Food insecurity:    Worry: Not on file    Inability: Not on file  . Transportation needs:    Medical: Not on file    Non-medical: Not on file  Tobacco Use  . Smoking status: Never Smoker  . Smokeless tobacco: Never Used  Substance and Sexual Activity  . Alcohol use: No    Alcohol/week: 0.0 standard drinks  . Drug use: No  . Sexual activity: Yes    Birth control/protection: Surgical    Comment: 1st intercourse 47 yo-More than 5 partners  Lifestyle  . Physical activity:    Days per week: Not on file    Minutes per session: Not on file  . Stress: Not on file  Relationships  . Social connections:    Talks on phone: Not on file    Gets together: Not on file    Attends religious service: Not on file    Active member of club or organization: Not on file    Attends meetings of clubs or organizations: Not on file    Relationship status: Not on file  . Intimate partner violence:    Fear of current or ex partner: Not on file    Emotionally abused: Not on file    Physically abused: Not on file    Forced sexual activity: Not on file  Other Topics Concern  . Not on file  Social History Narrative  . Not  on file     Family History  Problem Relation Age of Onset  . Hypertension Father   . Breast cancer Mother 29       [name]  . Heart disease Sister   . Diabetes Maternal Grandmother   . Colon cancer Paternal Grandfather        dx age 47s?  . Breast cancer Paternal Grandmother   . Coronary artery disease Neg Hx   . Stroke Neg Hx      Allergies as of 04/11/2018      Reactions   Codeine Anaphylaxis   Sulfasalazine Other (See Comments), Rash   blisters   Demeclocycline    Other reaction(s): Other (See Comments) Sutotuma?   Tetracyclines & Related Other (See Comments)   Pseudo tumor   Adhesive [tape] Hives      Medication List        Accurate as of 04/11/18 11:59 PM. Always use your most recent med list.          ARMOUR THYROID 60 MG tablet Generic drug:  thyroid Take 60 mg by mouth  daily.          Objective:   Physical Exam  Abdominal:     BP 124/76 (BP Location: Right Arm, Patient Position: Sitting, Cuff Size: Normal)   Pulse 76   Temp 97.7 F (36.5 C) (Oral)   Resp 16   Ht 5\' 2"  (1.575 m)   Wt 273 lb 8 oz (124.1 kg)   LMP 01/02/2013   SpO2 97%   BMI 50.02 kg/m  General: Well developed, NAD, see BMI.  Neck: No  thyromegaly  HEENT:  Normocephalic . Face symmetric, atraumatic Lungs:  CTA B Normal respiratory effort, no intercostal retractions, no accessory muscle use. Heart: RRR,  no murmur.  No pretibial edema bilaterally  Abdomen:  Not distended, soft, exam limited by patient habitus, she is tender particularly at the chest wall, right distal area, slightly tender at the RLQ of the abdomen without mass or rebound. See picture Skin: Exposed areas without rash.  Specifically no rash on the back or abdomen.  Not pale. Not jaundice Neurologic:  alert & oriented X3.  Speech normal, gait appropriate for age and unassisted Strength symmetric and appropriate for age.  Psych: Cognition and judgment appear intact.  Cooperative with normal attention span and concentration.  Behavior appropriate. No anxious or depressed appearing.     Assessment & Plan:   Assessment  Hypothyroid + PPD, s/p antibiotics Cervical cancer, laparoscopic radical total hysterectomy 02-2013 H/o STDs, HPV + Pseudotumor Cerebri- dx 2014, see neuro note 11-06-12 (also saw 2 additional neurologist including Dr Harvel Ricks 11-22-15) Morbid Obesity -- gastric banding ~ 2006 ([place]) H/o vit d def   PLAN: Hypothyroid: On meds, check a TSH Morbid obesity: Working with a weight management clinic, she is planning to have another bariatric surgery ASAP. RUQ abdominal pain, right chest wall pain: Etiology unclear, status post cholecystectomy years ago, she recently had diverticulitis per CT, that is clinically resolve, etiology unclear. We will get a chest x-ray, abdominal ultrasound, UA,  urine culture, amylase and lipase.  If pain persist consider a referral, I asked patient to notify me if this gets worse or not improving. The patient does not believe pain is related to the previous gastric banding which according to her  Is located  to the left. RTC 1 year   Today, in addition to CPX, I spent more than  15  min with the  patient: >50% of the time counseling regards RUQ abd pain

## 2018-04-12 LAB — URINALYSIS, ROUTINE W REFLEX MICROSCOPIC
BILIRUBIN URINE: NEGATIVE
Glucose, UA: NEGATIVE
Hgb urine dipstick: NEGATIVE
KETONES UR: NEGATIVE
Leukocytes, UA: NEGATIVE
Nitrite: NEGATIVE
PROTEIN: NEGATIVE
SPECIFIC GRAVITY, URINE: 1.015 (ref 1.001–1.03)
pH: 5.5 (ref 5.0–8.0)

## 2018-04-12 LAB — URINE CULTURE
MICRO NUMBER:: 91132390
SPECIMEN QUALITY:: ADEQUATE

## 2018-04-13 NOTE — Assessment & Plan Note (Signed)
Hypothyroid: On meds, check a TSH Morbid obesity: Working with a weight management clinic, she is planning to have another bariatric surgery ASAP. RUQ abdominal pain, right chest wall pain: Etiology unclear, status post cholecystectomy years ago, she recently had diverticulitis per CT, that is clinically resolve, etiology unclear. We will get a chest x-ray, abdominal ultrasound, UA, urine culture, amylase and lipase.  If pain persist consider a referral, I asked patient to notify me if this gets worse or not improving. The patient does not believe pain is related to the previous gastric banding which according to her  Is located  to the left. RTC 1 year

## 2018-04-14 ENCOUNTER — Other Ambulatory Visit: Payer: BLUE CROSS/BLUE SHIELD

## 2018-04-24 ENCOUNTER — Encounter: Payer: Self-pay | Admitting: Gastroenterology

## 2018-05-01 ENCOUNTER — Ambulatory Visit (HOSPITAL_BASED_OUTPATIENT_CLINIC_OR_DEPARTMENT_OTHER)
Admission: RE | Admit: 2018-05-01 | Discharge: 2018-05-01 | Disposition: A | Discharge status: Home / Self Care | Payer: BLUE CROSS/BLUE SHIELD | Source: Ambulatory Visit | Attending: Internal Medicine | Admitting: Internal Medicine

## 2018-05-01 DIAGNOSIS — Z9049 Acquired absence of other specified parts of digestive tract: Secondary | ICD-10-CM | POA: Diagnosis not present

## 2018-05-01 DIAGNOSIS — R1011 Right upper quadrant pain: Secondary | ICD-10-CM | POA: Diagnosis not present

## 2018-05-05 ENCOUNTER — Other Ambulatory Visit: Payer: BLUE CROSS/BLUE SHIELD

## 2018-05-28 DIAGNOSIS — Z1231 Encounter for screening mammogram for malignant neoplasm of breast: Secondary | ICD-10-CM

## 2018-05-29 DIAGNOSIS — E669 Obesity, unspecified: Secondary | ICD-10-CM | POA: Diagnosis not present

## 2018-05-29 DIAGNOSIS — E039 Hypothyroidism, unspecified: Secondary | ICD-10-CM | POA: Diagnosis not present

## 2018-05-29 DIAGNOSIS — E785 Hyperlipidemia, unspecified: Secondary | ICD-10-CM | POA: Diagnosis not present

## 2018-05-29 DIAGNOSIS — R7309 Other abnormal glucose: Secondary | ICD-10-CM | POA: Diagnosis not present

## 2018-05-29 DIAGNOSIS — Z0000 Encounter for general adult medical examination without abnormal findings: Secondary | ICD-10-CM | POA: Diagnosis not present

## 2018-05-29 DIAGNOSIS — G629 Polyneuropathy, unspecified: Secondary | ICD-10-CM | POA: Diagnosis not present

## 2018-06-02 DIAGNOSIS — H4711 Papilledema associated with increased intracranial pressure: Secondary | ICD-10-CM | POA: Diagnosis not present

## 2018-06-02 DIAGNOSIS — H35041 Retinal micro-aneurysms, unspecified, right eye: Secondary | ICD-10-CM | POA: Diagnosis not present

## 2018-06-02 DIAGNOSIS — H2513 Age-related nuclear cataract, bilateral: Secondary | ICD-10-CM | POA: Diagnosis not present

## 2018-06-02 DIAGNOSIS — H3561 Retinal hemorrhage, right eye: Secondary | ICD-10-CM | POA: Diagnosis not present

## 2018-06-03 ENCOUNTER — Encounter: Payer: Self-pay | Admitting: Gastroenterology

## 2018-06-03 ENCOUNTER — Ambulatory Visit: Payer: BLUE CROSS/BLUE SHIELD | Admitting: Gastroenterology

## 2018-06-03 VITALS — BP 130/78 | HR 79 | Ht 63.0 in | Wt 275.2 lb

## 2018-06-03 DIAGNOSIS — R1010 Upper abdominal pain, unspecified: Secondary | ICD-10-CM

## 2018-06-03 DIAGNOSIS — Z9884 Bariatric surgery status: Secondary | ICD-10-CM | POA: Diagnosis not present

## 2018-06-03 NOTE — Patient Instructions (Addendum)
You have an appointment with Dr. Toney Rakes on 06-04-18 at 1:30 pm, the address is [place], [place] [address]  [phone]     You have been scheduled for an Upper GI Series at [facility]. Your appointment is on 06-04-18 at 09:30 am. Please arrive 15 minutes prior to your test for registration. Make certain not to have anything to eat or drink after midnight on the night before your test. If you need to reschedule, please contact radiology at [phone]. --------------------------------------------------------------------------------------------------------------- An upper GI series uses x rays to help diagnose problems of the upper GI tract, which includes the esophagus, stomach, and duodenum. The duodenum is the first part of the small intestine. An upper GI series is conducted by a radiology technologist or a radiologist-a doctor who specializes in x-ray imaging-at a hospital or outpatient center. While sitting or standing in front of an x-ray machine, the patient drinks barium liquid, which is often white and has a chalky consistency and taste. The barium liquid coats the lining of the upper GI tract and makes signs of disease show up more clearly on x rays. X-ray video, called fluoroscopy, is used to view the barium liquid moving through the esophagus, stomach, and duodenum. Additional x rays and fluoroscopy are performed while the patient lies on an x-ray table. To fully coat the upper GI tract with barium liquid, the technologist or radiologist may press on the abdomen or ask the patient to change position. Patients hold still in various positions, allowing the technologist or radiologist to take x rays of the upper GI tract at different angles. If a technologist conducts the upper GI series, a radiologist will later examine the images to look for problems.  This test typically takes about 1 hour to  complete -------------------------------------------------------------------  Important: Drink plenty of water (8-10 cups/day) for a few days following the procedure to avoid constipation and blockage. The barium will make your stools white for a few days. --------------------------------------------------------------------------------------------------------------------------------------------

## 2018-06-03 NOTE — Progress Notes (Signed)
Referring Provider: Colon Branch, MD Primary Care Physician:  Colon Branch, MD   Reason for Consultation: Abdominal pain   IMPRESSION:  Upper quadrant abdominal pain x 4 months    -No etiology for pain identified on CT 10/2017 or abdominal ultrasound 05/01/2018 Gastric lap band in [place] in 2007 BMI 49 Prior cholecystectomy  Pain is likely related to gastric band.   PLAN: UGI series Make appointment with Dr. 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 Larose Smith for further evaluation of abdominal pain.  The history is obtained through the patient and review of her electronic health record including records at [facility]. Her husband accompanies her to this appointment.   Postprandial symptoms with the food touching something related to the lap band. Every meal is painful. Associated dizziness. She must physically support herself so she does not fall. She remembers having a hiatal hernia and is worried that it was not fixed during the surgery.   Gastric lap band performed in [place] in 2007. Initially lost 40 pounds but has regained it. Started a weight loss program through [facility]. She would like to try a gastric sleeve.  She was seen by Dr. Toney Smith 08/31/17 and reported similar symptoms. He recommended an upper GI and seeking authorization for band removal. Tried of Optifast was not tolerated due to constipation and acute diverticulitis.  She is frustrated that the time at [facility] has prioritized weight loss over her pain related to the band. She doesn't feel that they believe her diet choices. She has wanted to see the doctor to rediscuss these issues and has had a difficult time getting a follow-up appointment. She is concerned that they are following a protocol and not treating her as an individual.   Triggered by pork, beef, and  shredded meat. Fish or chicken is better tolerated. She has to eat very small bites.  Weight stable during this time.   She has chronic constipation that she attributes to the thyroid. She drinks water and increases her fiber intake. She knows what she needs to do without prescription therapy.   [name] had an abdominal ultrasound 05/01/2018 to evaluate for 93-month history of intermittent right upper quadrant pain.  The liver, common bile duct, and visualized pancreas appeared normal, however views of the pancreas were limited.  The gallbladder surgically absent.  Further review of epic shows a CT of the abdomen and pelvis with contrast 10/29/2017 to evaluate for left lower quadrant pain improved after defecation.  This revealed mild sigmoid colonic diverticulosis with a superimposed short segment of colonic wall thickening and pericolonic fat stranding consistent with mild sigmoid diverticulitis, gastric lap band present, prior cholecystectomy, and a benign appearing cyst in the adnexa.    Past Medical History:  Diagnosis Date  . ASCUS (atypical squamous cells of undetermined significance) on Pap smear 12/2012  . Blurred vision   . Cervical cancer ([place]) 02/2013   Stage IB1 poorly differentiated squamous cell  . Diverticulitis   . 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 Morbid obesity ([place])   . Positive PPD    s/p abx per patient  . Pseudotumor cerebri   . STD (sexually transmitted disease)    Chlamydia history  . Thyroid disease     Past Surgical History:  Procedure Laterality Date  . CHOLECYSTECTOMY  12/01  . LAPAROSCOPIC GASTRIC BANDING  2008   see OV 06/09/08- ?Malfunction  . LAPAROSCOPIC RADICAL TOTAL HYSTERECTOMY W/ NODE BIOPSY, RIGHT SALPINGECTOMY  02/2013   [facility]  . ORIF ANKLE FRACTURE Left 06/16/2014   Procedure: OPEN REDUCTION INTERNAL FIXATION (ORIF) LEFT ANKLE, LISFRANC FRACTURE;  Surgeon: Marianna Payment, MD;  Location: [facility];  Service: Orthopedics;  Laterality: Left;  .  SALPINGECTOMY Left 2011   ectopic pregnancy  . TUBAL LIGATION Right 2005    Current Outpatient Medications  Medication Sig Dispense Refill  . thyroid (ARMOUR THYROID) 60 MG tablet Take 60 mg by mouth daily.     No current facility-administered medications for this visit.     Allergies as of 06/03/2018 - Review Complete 04/11/2018  Allergen Reaction Noted  . Codeine Anaphylaxis   . Sulfasalazine Other (See Comments) and Rash 02/12/2013  . Demeclocycline  09/29/2014  . Tetracyclines & related Other (See Comments) 01/15/2013  . Adhesive [tape] Hives 07/27/2012    Family History  Problem Relation Age of Onset  . Hypertension Father   . Breast cancer Mother 52       Mrs Samantha Smith  . Heart disease Sister   . Diabetes Maternal Grandmother   . Colon cancer Paternal Grandfather        dx age 12s?  . Breast cancer Paternal Grandmother   . Coronary artery disease Neg Hx   . Stroke Neg Hx     Social History   Socioeconomic History  . Marital status: Married    Spouse name: Not on file  . Number of children: 0  . Years of education: Not on file  . Highest education level: Not on file  Occupational History  . Occupation: Optometrist    Social Needs  . Financial resource strain: Not on file  . Food insecurity:    Worry: Not on file    Inability: Not on file  . Transportation needs:    Medical: Not on file    Non-medical: Not on file  Tobacco Use  . Smoking status: Never Smoker  . Smokeless tobacco: Never Used  Substance and Sexual Activity  . Alcohol use: No    Alcohol/week: 0.0 standard drinks  . Drug use: No  . Sexual activity: Yes    Birth control/protection: Surgical    Comment: 1st intercourse 47 yo-More than 5 partners  Lifestyle  . Physical activity:    Days per week: Not on file    Minutes per session: Not on file  . Stress: Not on file  Relationships  . Social connections:    Talks on phone: Not on file    Gets together: Not on file     Attends religious service: Not on file    Active member of club or organization: Not on file    Attends meetings of clubs or organizations: Not on file    Relationship status: Not on file  . Intimate partner violence:    Fear of current or ex partner: Not on file    Emotionally abused: Not on file    Physically abused: Not on file    Forced sexual activity: Not on file  Other Topics Concern  . Not on file  Social History Narrative  . Not on file    Review of Systems: 12 system ROS is negative except as noted above.  @weight @ Physical Exam: Vital signs were reviewed. General:   Alert, well-nourished, pleasant and cooperative in NAD Head:  Normocephalic and atraumatic.  Eyes:  Sclera clear, no icterus.   Conjunctiva pink. Mouth:  No deformity or lesions.   Neck:  Supple; no thyromegaly. Lungs:  Clear throughout to auscultation.   No wheezes.  Heart:  Regular rate and rhythm; no murmurs Abdomen:  Soft, nontender, normal bowel sounds. No rebound or guarding. No hepatosplenomegaly Rectal:  Deferred  Msk:  Symmetrical without gross deformities. Extremities:  No gross deformities or edema. Neurologic:  Alert and  oriented x4;  grossly nonfocal Skin:  No rash or bruise. Psych:  Alert and cooperative. Normal mood and affect.   Rochele Lueck L. Tarri Glenn Md, MPH [facility] 06/03/2018, 7:26 AM

## 2018-06-04 ENCOUNTER — Other Ambulatory Visit: Payer: Self-pay | Admitting: Gastroenterology

## 2018-06-04 ENCOUNTER — Ambulatory Visit (HOSPITAL_COMMUNITY)
Admission: RE | Admit: 2018-06-04 | Discharge: 2018-06-04 | Disposition: A | Discharge status: Home / Self Care | Payer: BLUE CROSS/BLUE SHIELD | Source: Ambulatory Visit | Attending: Gastroenterology | Admitting: Gastroenterology

## 2018-06-04 DIAGNOSIS — K219 Gastro-esophageal reflux disease without esophagitis: Secondary | ICD-10-CM | POA: Diagnosis not present

## 2018-06-04 DIAGNOSIS — R1010 Upper abdominal pain, unspecified: Secondary | ICD-10-CM | POA: Diagnosis not present

## 2018-06-04 DIAGNOSIS — Z9884 Bariatric surgery status: Secondary | ICD-10-CM | POA: Insufficient documentation

## 2018-06-04 DIAGNOSIS — T85898D Other specified complication of other internal prosthetic devices, implants and grafts, subsequent encounter: Secondary | ICD-10-CM | POA: Diagnosis not present

## 2018-06-12 DIAGNOSIS — Z9884 Bariatric surgery status: Secondary | ICD-10-CM | POA: Diagnosis not present

## 2018-06-12 DIAGNOSIS — Z6841 Body Mass Index (BMI) 40.0 and over, adult: Secondary | ICD-10-CM | POA: Diagnosis not present

## 2018-06-13 ENCOUNTER — Ambulatory Visit (HOSPITAL_COMMUNITY): Payer: BLUE CROSS/BLUE SHIELD

## 2018-06-26 DIAGNOSIS — Z9884 Bariatric surgery status: Secondary | ICD-10-CM | POA: Diagnosis not present

## 2018-06-26 DIAGNOSIS — Z01818 Encounter for other preprocedural examination: Secondary | ICD-10-CM | POA: Diagnosis not present

## 2018-06-26 DIAGNOSIS — K9509 Other complications of gastric band procedure: Secondary | ICD-10-CM | POA: Diagnosis not present

## 2018-06-26 DIAGNOSIS — Z6841 Body Mass Index (BMI) 40.0 and over, adult: Secondary | ICD-10-CM | POA: Diagnosis not present

## 2018-06-27 ENCOUNTER — Other Ambulatory Visit (INDEPENDENT_AMBULATORY_CARE_PROVIDER_SITE_OTHER): Payer: BLUE CROSS/BLUE SHIELD

## 2018-06-27 DIAGNOSIS — R1011 Right upper quadrant pain: Secondary | ICD-10-CM | POA: Diagnosis not present

## 2018-06-27 DIAGNOSIS — Z0000 Encounter for general adult medical examination without abnormal findings: Secondary | ICD-10-CM | POA: Diagnosis not present

## 2018-06-27 LAB — CBC WITH DIFFERENTIAL/PLATELET
BASOS PCT: 0.7 % (ref 0.0–3.0)
Basophils Absolute: 0 10*3/uL (ref 0.0–0.1)
EOS ABS: 0.1 10*3/uL (ref 0.0–0.7)
EOS PCT: 1.3 % (ref 0.0–5.0)
HEMATOCRIT: 43.7 % (ref 36.0–46.0)
Hemoglobin: 14.6 g/dL (ref 12.0–15.0)
Lymphocytes Relative: 25.4 % (ref 12.0–46.0)
Lymphs Abs: 1.9 10*3/uL (ref 0.7–4.0)
MCHC: 33.5 g/dL (ref 30.0–36.0)
MCV: 93.8 fl (ref 78.0–100.0)
MONOS PCT: 5 % (ref 3.0–12.0)
Monocytes Absolute: 0.4 10*3/uL (ref 0.1–1.0)
Neutro Abs: 5 10*3/uL (ref 1.4–7.7)
Neutrophils Relative %: 67.6 % (ref 43.0–77.0)
Platelets: 267 10*3/uL (ref 150.0–400.0)
RBC: 4.66 Mil/uL (ref 3.87–5.11)
RDW: 13.5 % (ref 11.5–15.5)
WBC: 7.4 10*3/uL (ref 4.0–10.5)

## 2018-06-27 LAB — COMPREHENSIVE METABOLIC PANEL
ALT: 15 U/L (ref 0–35)
AST: 15 U/L (ref 0–37)
Albumin: 4.1 g/dL (ref 3.5–5.2)
Alkaline Phosphatase: 76 U/L (ref 39–117)
BUN: 9 mg/dL (ref 6–23)
CALCIUM: 9.1 mg/dL (ref 8.4–10.5)
CHLORIDE: 103 meq/L (ref 96–112)
CO2: 26 mEq/L (ref 19–32)
Creatinine, Ser: 0.61 mg/dL (ref 0.40–1.20)
GFR: 111.9 mL/min (ref >60.00)
GLUCOSE: 96 mg/dL (ref 70–99)
Potassium: 3.9 mEq/L (ref 3.5–5.1)
Sodium: 139 mEq/L (ref 135–145)
TOTAL PROTEIN: 6.8 g/dL (ref 6.0–8.3)
Total Bilirubin: 0.6 mg/dL (ref 0.2–1.2)

## 2018-06-27 LAB — LIPID PANEL
Cholesterol: 181 mg/dL (ref 0–200)
HDL: 52.5 mg/dL (ref >39.00)
LDL Cholesterol: 106 mg/dL — ABNORMAL HIGH (ref 0–99)
NONHDL: 128.96
Total CHOL/HDL Ratio: 3
Triglycerides: 115 mg/dL (ref 0.0–149.0)
VLDL: 23 mg/dL (ref 0.0–40.0)

## 2018-06-27 LAB — AMYLASE: Amylase: 17 U/L — ABNORMAL LOW (ref 27–131)

## 2018-06-27 LAB — LIPASE: LIPASE: 3 U/L — AB (ref 11.0–59.0)

## 2018-06-27 LAB — TSH: TSH: 2.96 u[IU]/mL (ref 0.35–4.50)

## 2018-06-29 ENCOUNTER — Encounter: Payer: Self-pay | Admitting: Internal Medicine

## 2018-07-01 DIAGNOSIS — E039 Hypothyroidism, unspecified: Secondary | ICD-10-CM | POA: Diagnosis not present

## 2018-07-01 DIAGNOSIS — R109 Unspecified abdominal pain: Secondary | ICD-10-CM | POA: Diagnosis not present

## 2018-07-01 DIAGNOSIS — Z6841 Body Mass Index (BMI) 40.0 and over, adult: Secondary | ICD-10-CM | POA: Diagnosis not present

## 2018-07-01 DIAGNOSIS — Z4651 Encounter for fitting and adjustment of gastric lap band: Secondary | ICD-10-CM | POA: Diagnosis not present

## 2018-07-01 DIAGNOSIS — R110 Nausea: Secondary | ICD-10-CM | POA: Diagnosis not present

## 2018-07-01 DIAGNOSIS — K9509 Other complications of gastric band procedure: Secondary | ICD-10-CM | POA: Diagnosis not present

## 2018-07-21 DIAGNOSIS — E785 Hyperlipidemia, unspecified: Secondary | ICD-10-CM | POA: Diagnosis not present

## 2018-07-21 DIAGNOSIS — E039 Hypothyroidism, unspecified: Secondary | ICD-10-CM | POA: Diagnosis not present

## 2018-07-21 DIAGNOSIS — R7309 Other abnormal glucose: Secondary | ICD-10-CM | POA: Diagnosis not present

## 2018-07-21 DIAGNOSIS — E559 Vitamin D deficiency, unspecified: Secondary | ICD-10-CM | POA: Diagnosis not present

## 2018-09-16 ENCOUNTER — Ambulatory Visit: Payer: BLUE CROSS/BLUE SHIELD | Admitting: Internal Medicine

## 2018-09-16 ENCOUNTER — Encounter: Payer: Self-pay | Admitting: Internal Medicine

## 2018-09-16 ENCOUNTER — Telehealth: Payer: Self-pay

## 2018-09-16 VITALS — BP 122/60 | HR 78 | Temp 98.0°F | Resp 16 | Ht 63.0 in | Wt 277.5 lb

## 2018-09-16 DIAGNOSIS — J9801 Acute bronchospasm: Secondary | ICD-10-CM

## 2018-09-16 DIAGNOSIS — J019 Acute sinusitis, unspecified: Secondary | ICD-10-CM

## 2018-09-16 DIAGNOSIS — J40 Bronchitis, not specified as acute or chronic: Secondary | ICD-10-CM | POA: Diagnosis not present

## 2018-09-16 NOTE — Progress Notes (Signed)
Subjective:    Patient ID: Samantha Smith, female    DOB: 1970/12/25, 48 y.o.   MRN: [id number]  DOS:  09/16/2018 Type of visit - description: Acute visit Went to the urgent care twice with respiratory symptoms Was seen 09/09/2018 with sore throat: Prescribed Z-Pak. Was seen again 09/12/2018: Was worse >>> chest congestion, sinus pain, shortness of breath. She was found to be wheezing, got nebulization and steroid IM injection. Was prescribed prednisone, Tessalon, pro-air and cefuroxime. Since then is slightly better but he still has some residual symptoms.   Review of Systems Currently with no fever chills No shortness of breath just difficult to breathe through her nose.  Cough and wheezing have definitely decreased. Has a lot of postnasal dripping, thick, green mucus.  Past Medical History:  Diagnosis Date  . ASCUS (atypical squamous cells of undetermined significance) on Pap smear 12/2012  . Blurred vision   . Cervical cancer ([place]) 02/2013   Stage IB1 poorly differentiated squamous cell  . Diverticulitis   . 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 Morbid obesity ([place])   . Positive PPD    s/p abx per patient  . Pseudotumor cerebri   . STD (sexually transmitted disease)    Chlamydia history  . Thyroid disease     Past Surgical History:  Procedure Laterality Date  . ABDOMINAL HYSTERECTOMY  2014  . CHOLECYSTECTOMY  12/01  . LAPAROSCOPIC GASTRIC BANDING  2008   see OV 06/09/08- ?Malfunction  . LAPAROSCOPIC RADICAL TOTAL HYSTERECTOMY W/ NODE BIOPSY, RIGHT SALPINGECTOMY  02/2013   [facility]  . ORIF ANKLE FRACTURE Left 06/16/2014   Procedure: OPEN REDUCTION INTERNAL FIXATION (ORIF) LEFT ANKLE, LISFRANC FRACTURE;  Surgeon: Marianna Payment, MD;  Location: [facility];  Service: Orthopedics;  Laterality: Left;  . SALPINGECTOMY Left 2011   ectopic pregnancy  . TUBAL LIGATION Right 2005    Social History   Socioeconomic History  . Marital status: Married    Spouse name: Not on file  .  Number of children: 0  . Years of education: Not on file  . Highest education level: Not on file  Occupational History  . Occupation: Optometrist    Social Needs  . Financial resource strain: Not on file  . Food insecurity:    Worry: Not on file    Inability: Not on file  . Transportation needs:    Medical: Not on file    Non-medical: Not on file  Tobacco Use  . Smoking status: Never Smoker  . Smokeless tobacco: Never Used  Substance and Sexual Activity  . Alcohol use: No    Alcohol/week: 0.0 standard drinks  . Drug use: No  . Sexual activity: Yes    Birth control/protection: Surgical    Comment: 1st intercourse 48 yo-More than 5 partners  Lifestyle  . Physical activity:    Days per week: Not on file    Minutes per session: Not on file  . Stress: Not on file  Relationships  . Social connections:    Talks on phone: Not on file    Gets together: Not on file    Attends religious service: Not on file    Active member of club or organization: Not on file    Attends meetings of clubs or organizations: Not on file    Relationship status: Not on file  . Intimate partner violence:    Fear of current or ex partner: Not on file    Emotionally abused: Not on file  Physically abused: Not on file    Forced sexual activity: Not on file  Other Topics Concern  . Not on file  Social History Narrative  . Not on file      Allergies as of 09/16/2018      Reactions   Codeine Anaphylaxis   Sulfasalazine Other (See Comments), Rash   blisters   Demeclocycline    Other reaction(s): Other (See Comments) [place]?   Tetracyclines & Related Other (See Comments)   Pseudo tumor   Adhesive [tape] Hives      Medication List       Accurate as of September 16, 2018  6:51 PM. Always use your most recent med list.        ARMOUR THYROID 60 MG tablet Generic drug:  thyroid Take 60 mg by mouth daily.   benzonatate 100 MG capsule Commonly known as:  TESSALON Take 1-2 capsules by mouth  every 8 (eight) hours as needed.   cefUROXime 500 MG tablet Commonly known as:  CEFTIN Take 1 tablet by mouth 2 (two) times daily.   predniSONE 10 MG (48) Tbpk tablet Commonly known as:  STERAPRED UNI-PAK 48 TAB Take as directed   PROAIR HFA 108 (90 Base) MCG/ACT inhaler Generic drug:  albuterol Inhale 2 puffs into the lungs every 4 (four) hours as needed.           Objective:   Physical Exam BP 122/60 (BP Location: Left Wrist, Patient Position: Sitting, Cuff Size: Small)   Pulse 78   Temp 98 F (36.7 C) (Oral)   Resp 16   Ht 5\' 3"  (1.6 m)   Wt 277 lb 8 oz (125.9 kg)   LMP 01/02/2013   SpO2 95%   BMI 49.16 kg/m  General:   Well developed, NAD, BMI noted. HEENT:  Normocephalic . Face symmetric, atraumatic.  TMs normal.  Nose quite congested, sinuses no TTP Lungs:  CTA B.  No crackles or wheezing. Normal respiratory effort, no intercostal retractions, no accessory muscle use. Heart: RRR,  no murmur.  No pretibial edema bilaterally  Skin: Not pale. Not jaundice Neurologic:  alert & oriented X3.  Speech normal, gait appropriate for age and unassisted Psych--  Cognition and judgment appear intact.  Cooperative with normal attention span and concentration.  Behavior appropriate. No anxious or depressed appearing.      Assessment     Assessment  Hypothyroid + PPD, s/p antibiotics Cervical cancer, laparoscopic radical total hysterectomy 02-2013 H/o STDs, HPV + Pseudotumor Cerebri- dx 2014, see neuro note 11-06-12 (also saw 2 additional neurologist including Dr Harvel Ricks 11-22-15) Morbid Obesity -- gastric banding ~ 2006 ([place]) H/o vit d def   PLAN: Sinusitis, bronchitis and bronchospasm. See HPI, status post a Z-Pak, now on cefuroxime.  Overall she is better.  Recommend to continue cefuroxime, prednisone (a taper pack which was prescribed elsewhere, she is somewhat confused about how to use it, we agreed on: 40, 40, 20 qd 4), albuterol as needed for wheezing,  saline spray. Also use Mucinex, Tessalon Perles and Flonase. Call if the improvement does not continue.

## 2018-09-16 NOTE — Telephone Encounter (Signed)
ROI faxed to [facility]. Awaiting records. ROI sent for scanning.

## 2018-09-16 NOTE — Patient Instructions (Addendum)
Please schedule your mammogram once better.  ===  Continue the antibiotic called cefuroxime  For cough control: Mucinex DM twice a day until better, also use the benzonatate tablets. Continue prednisone as recommended  Use albuterol as needed for persisting cough, wheezing.  For nasal congestion: Use the saline nasal spray as often as needed Flonase over-the-counter: 2 sprays on each side of the nose daily  Continue prednisone  Call if not gradually back to normal.

## 2018-09-16 NOTE — Progress Notes (Signed)
Pre visit review using our clinic review tool, if applicable. No additional management support is needed unless otherwise documented below in the visit note. 

## 2018-09-16 NOTE — Assessment & Plan Note (Addendum)
Sinusitis, bronchitis and bronchospasm. See HPI, status post a Z-Pak, now on cefuroxime.  Overall she is better.  Recommend to continue cefuroxime, prednisone (a taper pack which was prescribed elsewhere, she is somewhat confused about how to use it, we agreed on: 40, 40, 20 qd 4), albuterol as needed for wheezing, saline spray. Also use Mucinex, Tessalon Perles and Flonase. Call if the improvement does not continue.

## 2018-09-17 NOTE — Telephone Encounter (Signed)
Medical records received, placed in MD red folder.

## 2018-09-18 NOTE — Telephone Encounter (Signed)
Records reviewed, CXR was wnl

## 2018-09-22 DIAGNOSIS — J209 Acute bronchitis, unspecified: Secondary | ICD-10-CM | POA: Diagnosis not present

## 2018-12-29 DIAGNOSIS — Z20828 Contact with and (suspected) exposure to other viral communicable diseases: Secondary | ICD-10-CM | POA: Diagnosis not present

## 2018-12-29 DIAGNOSIS — E039 Hypothyroidism, unspecified: Secondary | ICD-10-CM | POA: Diagnosis not present

## 2018-12-29 DIAGNOSIS — E669 Obesity, unspecified: Secondary | ICD-10-CM | POA: Diagnosis not present

## 2018-12-31 DIAGNOSIS — Z20828 Contact with and (suspected) exposure to other viral communicable diseases: Secondary | ICD-10-CM | POA: Diagnosis not present

## 2018-12-31 DIAGNOSIS — E039 Hypothyroidism, unspecified: Secondary | ICD-10-CM | POA: Diagnosis not present

## 2019-04-14 ENCOUNTER — Encounter: Payer: Self-pay | Admitting: Gynecology

## 2019-05-05 ENCOUNTER — Other Ambulatory Visit: Payer: Self-pay | Admitting: Gynecology

## 2019-05-05 ENCOUNTER — Other Ambulatory Visit: Payer: Self-pay

## 2019-05-05 ENCOUNTER — Ambulatory Visit
Admission: RE | Admit: 2019-05-05 | Discharge: 2019-05-05 | Disposition: A | Discharge status: Home / Self Care | Payer: BC Managed Care – PPO | Source: Ambulatory Visit | Attending: Gynecology | Admitting: Gynecology

## 2019-05-05 DIAGNOSIS — Z1231 Encounter for screening mammogram for malignant neoplasm of breast: Secondary | ICD-10-CM

## 2019-05-21 ENCOUNTER — Other Ambulatory Visit: Payer: Self-pay

## 2019-05-21 ENCOUNTER — Encounter: Payer: Self-pay | Admitting: Gynecology

## 2019-05-21 ENCOUNTER — Ambulatory Visit: Payer: BC Managed Care – PPO | Admitting: Gynecology

## 2019-05-21 VITALS — BP 124/82 | Ht 63.0 in | Wt 285.0 lb

## 2019-05-21 DIAGNOSIS — Z23 Encounter for immunization: Secondary | ICD-10-CM

## 2019-05-21 DIAGNOSIS — Z8541 Personal history of malignant neoplasm of cervix uteri: Secondary | ICD-10-CM | POA: Diagnosis not present

## 2019-05-21 DIAGNOSIS — Z01419 Encounter for gynecological examination (general) (routine) without abnormal findings: Secondary | ICD-10-CM

## 2019-05-21 NOTE — Patient Instructions (Signed)
Follow-up in 1 year for annual exam, sooner if any issues. 

## 2019-05-21 NOTE — Addendum Note (Signed)
Addended by: Nelva Nay on: 05/21/2019 02:46 PM   Modules accepted: Orders

## 2019-05-21 NOTE — Progress Notes (Signed)
    Samantha Smith Jan 14, 1971 [id number]        48 y.o.  G3P0030 for annual gynecologic exam.  Without gynecologic complaints  Past medical history,surgical history, problem list, medications, allergies, family history and social history were all reviewed and documented as reviewed in the EPIC chart.  ROS:  Performed with pertinent positives and negatives included in the history, assessment and plan.   Additional significant findings : None   Exam: [name] assistant Vitals:   05/21/19 1418  BP: 124/82  Weight: 285 lb (129.3 kg)  Height: 5\' 3"  (1.6 m)   Body mass index is 50.49 kg/m.  General appearance:  Normal affect, orientation and appearance. Skin: Grossly normal HEENT: Without gross lesions.  No cervical or supraclavicular adenopathy. Thyroid normal.  Lungs:  Clear without wheezing, rales or rhonchi Cardiac: RR, without RMG Abdominal:  Soft, nontender, without masses, guarding, rebound, organomegaly or hernia Breasts:  Examined lying and sitting without masses, retractions, discharge or axillary adenopathy. Pelvic:  Ext, BUS, Vagina: Normal.  Pap smear done  Adnexa: Without masses or tenderness    Anus and perineum: Normal   Rectovaginal: Normal sphincter tone without palpated masses or tenderness.    Assessment/Plan:  48 y.o. G20P0030 female for annual gynecologic exam.   1. History of stage IB 1 cervical cancer status post robotic radical hysterectomy with lymph node dissection 2014 at [facility]  Doing well without complaints.  Exam NED.  Pap smear done. 2. Mammography 04/2019.  Continue with annual mammography next year.  Breast exam normal today. 3. Health maintenance.  No routine lab work done as patient does this elsewhere.  Follow-up 1 year, sooner as needed.   Anastasio Auerbach MD, 2:37 PM 05/21/2019

## 2019-05-22 ENCOUNTER — Encounter: Payer: Self-pay | Admitting: Gynecology

## 2019-05-22 LAB — PAP IG W/ RFLX HPV ASCU

## 2019-06-23 DIAGNOSIS — E039 Hypothyroidism, unspecified: Secondary | ICD-10-CM | POA: Diagnosis not present

## 2019-08-12 ENCOUNTER — Other Ambulatory Visit: Payer: Self-pay

## 2019-08-13 ENCOUNTER — Ambulatory Visit: Payer: BC Managed Care – PPO | Admitting: Internal Medicine

## 2019-08-17 ENCOUNTER — Other Ambulatory Visit: Payer: Self-pay | Admitting: Internal Medicine

## 2019-08-17 DIAGNOSIS — R1011 Right upper quadrant pain: Secondary | ICD-10-CM

## 2019-08-28 ENCOUNTER — Ambulatory Visit
Admission: RE | Admit: 2019-08-28 | Discharge: 2019-08-28 | Disposition: A | Discharge status: Home / Self Care | Payer: BC Managed Care – PPO | Source: Ambulatory Visit | Attending: Internal Medicine | Admitting: Internal Medicine

## 2019-08-28 DIAGNOSIS — R1011 Right upper quadrant pain: Secondary | ICD-10-CM

## 2019-10-08 ENCOUNTER — Other Ambulatory Visit: Payer: BC Managed Care – PPO | Attending: Family

## 2019-10-08 DIAGNOSIS — Z23 Encounter for immunization: Secondary | ICD-10-CM | POA: Insufficient documentation

## 2019-10-08 NOTE — Progress Notes (Signed)
   Covid-19 Vaccination Clinic  Name:  Samantha Smith    MRN: [id number] DOB: Aug 21, 1970  10/08/2019  [name] was observed post Covid-19 immunization for 15 minutes without incident. She was provided with Vaccine Information Sheet and instruction to access the V-Safe system.   [name] was instructed to call 911 with any severe reactions post vaccine: Marland Kitchen Difficulty breathing  . Swelling of face and throat  . A fast heartbeat  . A bad rash all over body  . Dizziness and weakness   Immunizations Administered    Name Date Dose VIS Date Route   Moderna COVID-19 Vaccine 10/08/2019  9:44 AM 0.5 mL 06/23/2019 Intramuscular   Manufacturer: Moderna   Lot: [id number]   [id number]

## 2019-11-10 ENCOUNTER — Ambulatory Visit: Payer: BC Managed Care – PPO | Attending: Family

## 2019-11-10 DIAGNOSIS — Z23 Encounter for immunization: Secondary | ICD-10-CM

## 2019-11-10 NOTE — Progress Notes (Signed)
   Covid-19 Vaccination Clinic  Name:  Samantha Smith    MRN: [id number] DOB: Aug 30, 1970  11/10/2019  Samantha Smith was observed post Covid-19 immunization for 15 minutes without incident. She was provided with Vaccine Information Sheet and instruction to access the V-Safe system.   Samantha Smith was instructed to call 911 with any severe reactions post vaccine: Marland Kitchen Difficulty breathing  . Swelling of face and throat  . A fast heartbeat  . A bad rash all over body  . Dizziness and weakness   Immunizations Administered    Name Date Dose VIS Date Route   Moderna COVID-19 Vaccine 11/10/2019 10:07 AM 0.5 mL 06/2019 Intramuscular   Manufacturer: Moderna   Lot: [id number]   [id number]

## 2020-04-22 ENCOUNTER — Other Ambulatory Visit: Payer: Self-pay | Admitting: Internal Medicine

## 2020-04-22 DIAGNOSIS — Z1231 Encounter for screening mammogram for malignant neoplasm of breast: Secondary | ICD-10-CM

## 2020-05-11 ENCOUNTER — Ambulatory Visit
Admission: RE | Admit: 2020-05-11 | Discharge: 2020-05-11 | Disposition: A | Discharge status: Home / Self Care | Payer: BC Managed Care – PPO | Source: Ambulatory Visit | Attending: Internal Medicine | Admitting: Internal Medicine

## 2020-05-11 ENCOUNTER — Other Ambulatory Visit: Payer: Self-pay

## 2020-05-11 DIAGNOSIS — Z1231 Encounter for screening mammogram for malignant neoplasm of breast: Secondary | ICD-10-CM

## 2020-05-27 ENCOUNTER — Encounter: Payer: Self-pay | Admitting: Obstetrics and Gynecology

## 2020-05-27 ENCOUNTER — Other Ambulatory Visit: Payer: Self-pay

## 2020-05-27 ENCOUNTER — Encounter: Payer: BC Managed Care – PPO | Admitting: Obstetrics and Gynecology

## 2020-07-27 ENCOUNTER — Other Ambulatory Visit: Payer: Self-pay

## 2020-07-27 ENCOUNTER — Ambulatory Visit (INDEPENDENT_AMBULATORY_CARE_PROVIDER_SITE_OTHER): Payer: BC Managed Care – PPO | Admitting: Obstetrics and Gynecology

## 2020-07-27 ENCOUNTER — Encounter: Payer: Self-pay | Admitting: Obstetrics and Gynecology

## 2020-07-27 VITALS — BP 122/80 | Ht 63.0 in | Wt 281.0 lb

## 2020-07-27 DIAGNOSIS — Z8541 Personal history of malignant neoplasm of cervix uteri: Secondary | ICD-10-CM | POA: Diagnosis not present

## 2020-07-27 DIAGNOSIS — Z1272 Encounter for screening for malignant neoplasm of vagina: Secondary | ICD-10-CM

## 2020-07-27 DIAGNOSIS — Z01419 Encounter for gynecological examination (general) (routine) without abnormal findings: Secondary | ICD-10-CM

## 2020-07-27 NOTE — Progress Notes (Signed)
   Samantha Smith [birth year] [id number]  SUBJECTIVE:  50 y.o. G3P0030 female for annual routine gynecologic exam and Pap smear. She has no gynecologic concerns.  Current Outpatient Medications  Medication Sig Dispense Refill  . thyroid (ARMOUR THYROID) 60 MG tablet Take 60 mg by mouth daily.     No current facility-administered medications for this visit.   Allergies: Codeine, Sulfasalazine, Demeclocycline, Tetracyclines & related, and Adhesive [tape]  Patient's last menstrual period was 01/02/2013.  Past medical history,surgical history, problem list, medications, allergies, family history and social history were all reviewed and documented as reviewed in the EPIC chart.  ROS: Pertinent positives and negatives as reviewed in HPI    OBJECTIVE:  Ht 5\' 3"  (1.6 m)   Wt 281 lb (127.5 kg)   LMP 01/02/2013   BMI 49.78 kg/m  The patient appears well, alert, oriented, in no distress. ENT normal.  Neck supple. No cervical or supraclavicular adenopathy or thyromegaly.  Lungs are clear, good air entry, no wheezes, rhonchi or rales. S1 and S2 normal, no murmurs, regular rate and rhythm.  Abdomen soft without tenderness, guarding, mass or organomegaly.  Neurological is normal, no focal findings.  BREAST EXAM: breasts appear normal, no suspicious masses, no skin or nipple changes or axillary nodes  PELVIC EXAM: VULVA: normal appearing vulva with no masses, tenderness or lesions, VAGINA: normal appearing vagina with normal color and discharge, no lesions, CERVIX: surgically absent, UTERUS: surgically absent, vaginal cuff normal, ADNEXA: no masses, nontender  Chaperone: 01/04/2013 Bonham present during the examination  ASSESSMENT:  50 y.o. G3P0030 here for annual gynecologic exam  PLAN:   1.  History of stage IB1 cervical cancer status post robotic radical hysterectomy with lymph node dissection in 2014 at [facility].  Doing well, no concerns other than a bit of chronic deep dyspareunia  present since her surgery.  Discussed scarring from healing after extensive surgery is likely the cause.  She has been doing okay with this.  Could see if pelvic floor PT would be beneficial if she would desire to go that route.  Exam NED.  Pap smear collected. 2. Mammogram 04/2020.  Normal breast exam today.  Continue with annual mammograms. 3. Health maintenance.  No labs today as she normally has these completed elsewhere.  Says she is planning for bariatric surgery.   Return annually or sooner, prn.  05/2020 MD 07/27/20

## 2020-07-28 LAB — PAP IG W/ RFLX HPV ASCU

## 2020-08-02 DIAGNOSIS — Z713 Dietary counseling and surveillance: Secondary | ICD-10-CM | POA: Diagnosis not present

## 2020-09-05 DIAGNOSIS — E039 Hypothyroidism, unspecified: Secondary | ICD-10-CM | POA: Diagnosis not present

## 2020-10-03 ENCOUNTER — Encounter: Payer: Self-pay | Admitting: Internal Medicine

## 2020-10-04 DIAGNOSIS — Z6841 Body Mass Index (BMI) 40.0 and over, adult: Secondary | ICD-10-CM | POA: Diagnosis not present

## 2020-10-04 DIAGNOSIS — K5900 Constipation, unspecified: Secondary | ICD-10-CM | POA: Diagnosis not present

## 2020-10-04 DIAGNOSIS — Z713 Dietary counseling and surveillance: Secondary | ICD-10-CM | POA: Diagnosis not present

## 2020-10-04 DIAGNOSIS — E669 Obesity, unspecified: Secondary | ICD-10-CM | POA: Diagnosis not present

## 2020-10-13 DIAGNOSIS — Z713 Dietary counseling and surveillance: Secondary | ICD-10-CM | POA: Diagnosis not present

## 2020-11-18 DIAGNOSIS — Z01818 Encounter for other preprocedural examination: Secondary | ICD-10-CM | POA: Diagnosis not present

## 2020-12-27 ENCOUNTER — Other Ambulatory Visit: Payer: Self-pay

## 2020-12-27 ENCOUNTER — Ambulatory Visit (INDEPENDENT_AMBULATORY_CARE_PROVIDER_SITE_OTHER): Payer: BC Managed Care – PPO | Admitting: Internal Medicine

## 2020-12-27 ENCOUNTER — Encounter: Payer: Self-pay | Admitting: Internal Medicine

## 2020-12-27 VITALS — BP 124/76 | HR 74 | Temp 97.6°F | Resp 18 | Ht 63.0 in | Wt 288.0 lb

## 2020-12-27 DIAGNOSIS — Z1211 Encounter for screening for malignant neoplasm of colon: Secondary | ICD-10-CM

## 2020-12-27 DIAGNOSIS — E559 Vitamin D deficiency, unspecified: Secondary | ICD-10-CM

## 2020-12-27 DIAGNOSIS — Z1159 Encounter for screening for other viral diseases: Secondary | ICD-10-CM

## 2020-12-27 DIAGNOSIS — E785 Hyperlipidemia, unspecified: Secondary | ICD-10-CM | POA: Diagnosis not present

## 2020-12-27 DIAGNOSIS — Z0000 Encounter for general adult medical examination without abnormal findings: Secondary | ICD-10-CM

## 2020-12-27 LAB — LIPID PANEL
Cholesterol: 177 mg/dL (ref 0–200)
HDL: 52.8 mg/dL (ref >39.00)
LDL Cholesterol: 100 mg/dL — ABNORMAL HIGH (ref 0–99)
NonHDL: 124.03
Total CHOL/HDL Ratio: 3
Triglycerides: 122 mg/dL (ref 0.0–149.0)
VLDL: 24.4 mg/dL (ref 0.0–40.0)

## 2020-12-27 LAB — VITAMIN D 25 HYDROXY (VIT D DEFICIENCY, FRACTURES): VITD: 12.05 ng/mL — ABNORMAL LOW (ref 30.00–100.00)

## 2020-12-27 NOTE — Progress Notes (Signed)
Subjective:    Patient ID: Samantha Smith, female    DOB: 1970/09/21, 50 y.o.   MRN: [id number]  DOS:  12/27/2020 Type of visit - description: CPX  Has no major concerns  Review of Systems    A 14 point review of systems is negative    Past Medical History:  Diagnosis Date  . Cervical cancer ([place]) 02/2013   Stage IB1 poorly differentiated squamous cell  . Diverticulitis   . 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 Morbid obesity ([place])   . Positive PPD    s/p abx per patient  . Pseudotumor cerebri   . STD (sexually transmitted disease)    Chlamydia history  . Thyroid disease     Past Surgical History:  Procedure Laterality Date  . ABDOMINAL HYSTERECTOMY  2014  . CHOLECYSTECTOMY  12/01  . LAPAROSCOPIC GASTRIC BANDING  2008   see OV 06/09/08- ?Malfunction  . LAPAROSCOPIC RADICAL TOTAL HYSTERECTOMY W/ NODE BIOPSY, RIGHT SALPINGECTOMY  02/2013   [facility]  . ORIF ANKLE FRACTURE Left 06/16/2014   Procedure: OPEN REDUCTION INTERNAL FIXATION (ORIF) LEFT ANKLE, LISFRANC FRACTURE;  Surgeon: Marianna Payment, MD;  Location: [facility];  Service: Orthopedics;  Laterality: Left;  . SALPINGECTOMY Left 2011   ectopic pregnancy  . TUBAL LIGATION Right 2005   Social History   Socioeconomic History  . Marital status: Married    Spouse name: Not on file  . Number of children: 0  . Years of education: Not on file  . Highest education level: Not on file  Occupational History  . Occupation: Optometrist    Tobacco Use  . Smoking status: Never Smoker  . Smokeless tobacco: Never Used  Vaping Use  . Vaping Use: Never used  Substance and Sexual Activity  . Alcohol use: No    Alcohol/week: 0.0 standard drinks  . Drug use: No  . Sexual activity: Yes    Birth control/protection: Surgical    Comment: 1st intercourse 50 yo-More than 5 partners  Other Topics Concern  . Not on file  Social History Narrative  . Not on file   Social Determinants of Health   Financial Resource Strain: Not on  file  Food Insecurity: Not on file  Transportation Needs: Not on file  Physical Activity: Not on file  Stress: Not on file  Social Connections: Not on file  Intimate Partner Violence: Not on file    Allergies as of 12/27/2020      Reactions   Codeine Anaphylaxis   Sulfasalazine Other (See Comments), Rash   blisters   Demeclocycline    Other reaction(s): Other (See Comments) [place]?   Tetracyclines & Related Other (See Comments)   Pseudo tumor   Adhesive [tape] Hives      Medication List       Accurate as of December 27, 2020 11:59 PM. If you have any questions, ask your nurse or doctor.        cholecalciferol 25 MCG (1000 UNIT) tablet Commonly known as: VITAMIN D3 Take 2,000 Units by mouth daily.   thyroid 60 MG tablet Commonly known as: ARMOUR Take 60 mg by mouth daily.   Vitamin D (Ergocalciferol) 1.25 MG (50000 UNIT) Caps capsule Commonly known as: DRISDOL Take 1 capsule (50,000 Units total) by mouth every 7 (seven) days. Started by: Kathlene November, MD          Objective:   Physical Exam BP 124/76 (BP Location: Left Arm, Patient Position: Sitting, Cuff Size: Normal)  Pulse 74   Temp 97.6 F (36.4 C) (Oral)   Resp 18   Ht 5\' 3"  (1.6 m)   Wt 288 lb (130.6 kg)   LMP 01/02/2013   SpO2 98%   BMI 51.02 kg/m  General: Well developed, NAD, BMI noted Neck: No  thyromegaly  HEENT:  Normocephalic . Face symmetric, atraumatic Lungs:  CTA B Normal respiratory effort, no intercostal retractions, no accessory muscle use. Heart: RRR,  no murmur.  Abdomen:  Not distended, soft, non-tender. No rebound or rigidity.   Lower extremities: no pretibial edema bilaterally  Skin: Exposed areas without rash. Not pale. Not jaundice Neurologic:  alert & oriented X3.  Speech normal, gait appropriate for age and unassisted Strength symmetric and appropriate for age.  Psych: Cognition and judgment appear intact.  Cooperative with normal attention span and concentration.   Behavior appropriate. No anxious or depressed appearing.     Assessment      Assessment  Hypothyroid + PPD, s/p antibiotics Cervical cancer, laparoscopic radical total hysterectomy 02-2013 H/o STDs, HPV + Pseudotumor Cerebri- dx 2014, see neuro note 11-06-12 (also saw 2 additional neurologist including Dr Harvel Ricks 11-22-15) Morbid Obesity:gastric banding ~ 2006 ([place] removed 06/2018 ([facility]), schedule gastric sleeve 02/2021 H/o vit d def   PLAN: Here for CPX, last seen 08-2018. Hypothyroid: On thyroid med, rx elesewhere Morbid obesity: s/p gastric banding ~ 2006 ([place]), removed 06/2018 ([facility]), schedule gastric sleeve 02/2021 H/o vit d def: Currently on a sporadic use of OTCs, recommend vitamin D3 2000 units daily, checking levels. RTC 1 year   This visit occurred during the SARS-CoV-2 public health emergency.  Safety protocols were in place, including screening questions prior to the visit, additional usage of staff PPE, and extensive cleaning of exam room while observing appropriate contact time as indicated for disinfecting solutions.

## 2020-12-27 NOTE — Patient Instructions (Addendum)
Please call the gastroenterology office and set up your colonoscopy [phone]  Start taking vitamin D3: 2000 units daily   GO TO THE LAB : Get the blood work     [place] back for a physical exam in 1 year

## 2020-12-28 ENCOUNTER — Encounter: Payer: Self-pay | Admitting: Internal Medicine

## 2020-12-28 LAB — HEPATITIS C ANTIBODY
Hepatitis C Ab: NONREACTIVE
SIGNAL TO CUT-OFF: 0.01 (ref <1.00)

## 2020-12-28 MED ORDER — VITAMIN D (ERGOCALCIFEROL) 1.25 MG (50000 UNIT) PO CAPS: 50000.0000 [IU] | ORAL_CAPSULE | ORAL | 0 refills | Status: DC

## 2020-12-28 NOTE — Assessment & Plan Note (Signed)
Here for CPX, last seen 08-2018. Hypothyroid: On thyroid med, rx elesewhere Morbid obesity: s/p gastric banding ~ 2006 ([place]), removed 06/2018 ([facility]), schedule gastric sleeve 02/2021 H/o vit d def: Currently on a sporadic use of OTCs, recommend vitamin D3 2000 units daily, checking levels. RTC 1 year

## 2020-12-28 NOTE — Assessment & Plan Note (Signed)
-  Td 2019 - PNM 23 : 2016 - COVID VAX x3, rec booster -Female care:  per gynecology, personal h/o cervical cancer + FH breast ca: MMG 04-2020 per K PN. -CCS: No Cscope, h/o diverticulitis, referral to GI failure twice , will try again - Recent labs: 11/18/2020: CBC okay, glucose 66, creatinine 0.5, LFTs normal.  A1c 5.5.  TSH 3.4 Will check FLP, vitamin D and hep C -Diet and exercise: Discussed, to have soon a gastric sleeve

## 2021-01-19 ENCOUNTER — Encounter: Payer: Self-pay | Admitting: Internal Medicine

## 2021-02-06 DIAGNOSIS — Z713 Dietary counseling and surveillance: Secondary | ICD-10-CM | POA: Diagnosis not present

## 2021-02-09 DIAGNOSIS — Z01818 Encounter for other preprocedural examination: Secondary | ICD-10-CM | POA: Diagnosis not present

## 2021-02-09 DIAGNOSIS — Z6841 Body Mass Index (BMI) 40.0 and over, adult: Secondary | ICD-10-CM | POA: Diagnosis not present

## 2021-02-20 DIAGNOSIS — K5792 Diverticulitis of intestine, part unspecified, without perforation or abscess without bleeding: Secondary | ICD-10-CM | POA: Diagnosis not present

## 2021-02-20 DIAGNOSIS — K660 Peritoneal adhesions (postprocedural) (postinfection): Secondary | ICD-10-CM | POA: Diagnosis not present

## 2021-02-20 DIAGNOSIS — R110 Nausea: Secondary | ICD-10-CM | POA: Diagnosis not present

## 2021-02-20 DIAGNOSIS — E039 Hypothyroidism, unspecified: Secondary | ICD-10-CM | POA: Diagnosis not present

## 2021-02-20 DIAGNOSIS — Z6841 Body Mass Index (BMI) 40.0 and over, adult: Secondary | ICD-10-CM | POA: Diagnosis not present

## 2021-02-20 HISTORY — PX: LAPAROSCOPIC GASTRIC SLEEVE RESECTION: SHX5895

## 2021-02-21 DIAGNOSIS — Z6841 Body Mass Index (BMI) 40.0 and over, adult: Secondary | ICD-10-CM | POA: Diagnosis not present

## 2021-02-21 DIAGNOSIS — E039 Hypothyroidism, unspecified: Secondary | ICD-10-CM | POA: Diagnosis not present

## 2021-02-21 DIAGNOSIS — K5792 Diverticulitis of intestine, part unspecified, without perforation or abscess without bleeding: Secondary | ICD-10-CM | POA: Diagnosis not present

## 2021-02-21 DIAGNOSIS — K660 Peritoneal adhesions (postprocedural) (postinfection): Secondary | ICD-10-CM | POA: Diagnosis not present

## 2021-02-22 DIAGNOSIS — Z6841 Body Mass Index (BMI) 40.0 and over, adult: Secondary | ICD-10-CM | POA: Diagnosis not present

## 2021-02-22 DIAGNOSIS — K5792 Diverticulitis of intestine, part unspecified, without perforation or abscess without bleeding: Secondary | ICD-10-CM | POA: Diagnosis not present

## 2021-02-22 DIAGNOSIS — K660 Peritoneal adhesions (postprocedural) (postinfection): Secondary | ICD-10-CM | POA: Diagnosis not present

## 2021-02-22 DIAGNOSIS — E039 Hypothyroidism, unspecified: Secondary | ICD-10-CM | POA: Diagnosis not present

## 2021-03-23 DIAGNOSIS — Z713 Dietary counseling and surveillance: Secondary | ICD-10-CM | POA: Diagnosis not present

## 2021-03-23 DIAGNOSIS — Z9884 Bariatric surgery status: Secondary | ICD-10-CM | POA: Diagnosis not present

## 2021-03-31 ENCOUNTER — Other Ambulatory Visit: Payer: Self-pay

## 2021-03-31 ENCOUNTER — Encounter: Payer: Self-pay | Admitting: Obstetrics & Gynecology

## 2021-03-31 ENCOUNTER — Ambulatory Visit: Payer: BC Managed Care – PPO | Admitting: Obstetrics & Gynecology

## 2021-03-31 VITALS — BP 118/78 | HR 89 | Resp 16

## 2021-03-31 DIAGNOSIS — N9089 Other specified noninflammatory disorders of vulva and perineum: Secondary | ICD-10-CM

## 2021-03-31 DIAGNOSIS — L292 Pruritus vulvae: Secondary | ICD-10-CM

## 2021-03-31 LAB — WET PREP FOR TRICH, YEAST, CLUE

## 2021-03-31 MED ORDER — TINIDAZOLE 500 MG PO TABS: 1000.0000 mg | ORAL_TABLET | Freq: Two times a day (BID) | ORAL | 0 refills | Status: AC

## 2021-03-31 MED ORDER — FLUCONAZOLE 150 MG PO TABS: 150.0000 mg | ORAL_TABLET | Freq: Every day | ORAL | 1 refills | Status: AC

## 2021-03-31 NOTE — Progress Notes (Signed)
    Samantha Smith 1970-10-18 [id number]        50 y.o.  G3P0030   RP: Vulvar bumps with itching  HPI: Vulvar bumps worsening in the last 2 weeks.  Worsening by rubbing when patient walks.  No pus seen.  No history of herpes.  Also complaining of vulvar itching.  Mild vaginal discharge.  No pelvic pain.  No fever.  Patient had gastric sleeve surgery recently.   OB History  Gravida Para Term Preterm AB Living  3       3 0  SAB IAB Ectopic Multiple Live Births      2        # Outcome Date GA Lbr Len/2nd Weight Sex Delivery Anes PTL Lv  3 Ectopic           2 AB           1 Ectopic             Past medical history,surgical history, problem list, medications, allergies, family history and social history were all reviewed and documented in the EPIC chart.   Directed ROS with pertinent positives and negatives documented in the history of present illness/assessment and plan.  Exam:  Vitals:   03/31/21 1022  BP: 118/78  Pulse: 89  Resp: 16   General appearance:  Normal   Gynecologic exam: Vulva:  post vulva/upper inner thigh probable sebaceous cysts.  2 on the right, 1 on the left.  Erythema and tenderness.  HSV Sureswab done.  Wet prep taken in vagina.  Wet prep:  Clue cells with odor present   Assessment/Plan:  50 y.o. G3P0030   1. Vulvar lesion Probably sebaceous's gland cysts in a location difficult to heal given that there is friction when walking.  Recommend warm soaking and compresses.  Will use Neosporin ointment for local antibiotics as well as to decrease friction.  Will rule out genital herpes with sure swab HSV. - SureSwab HSV, Type 1/2 DNA, PCR  2. Vulvar itching Wet prep confirmed bacterial vaginosis.  Will treat with tinidazole 2 tablets per mouth twice a day for 2 days.  Follow with fluconazole 1 tablet per mouth daily for 3 days to prevent post antibiotic yeast vaginitis.  Both prescriptions sent to pharmacy. - WET PREP FOR [place], YEAST, CLUE  Other  orders - Multiple Vitamin (MULTIVITAMIN PO); Take by mouth.  - tinidazole (TINDAMAX) 500 MG tablet; Take 2 tablets (1,000 mg total) by mouth 2 (two) times daily for 2 days. - fluconazole (DIFLUCAN) 150 MG tablet; Take 1 tablet (150 mg total) by mouth daily for 3 days.   Princess Bruins MD, 10:37 AM 03/31/2021

## 2021-04-03 LAB — SURESWAB HSV, TYPE 1/2 DNA, PCR
HSV 1 DNA: NOT DETECTED
HSV 2 DNA: NOT DETECTED

## 2021-04-05 ENCOUNTER — Encounter: Payer: Self-pay | Admitting: *Deleted

## 2021-04-05 DIAGNOSIS — R7309 Other abnormal glucose: Secondary | ICD-10-CM | POA: Diagnosis not present

## 2021-04-05 DIAGNOSIS — E559 Vitamin D deficiency, unspecified: Secondary | ICD-10-CM | POA: Diagnosis not present

## 2021-04-05 DIAGNOSIS — K581 Irritable bowel syndrome with constipation: Secondary | ICD-10-CM | POA: Diagnosis not present

## 2021-04-05 DIAGNOSIS — E785 Hyperlipidemia, unspecified: Secondary | ICD-10-CM | POA: Diagnosis not present

## 2021-04-05 DIAGNOSIS — E039 Hypothyroidism, unspecified: Secondary | ICD-10-CM | POA: Diagnosis not present

## 2021-04-21 ENCOUNTER — Encounter: Payer: Self-pay | Admitting: Internal Medicine

## 2021-06-29 ENCOUNTER — Other Ambulatory Visit: Payer: Self-pay

## 2021-06-29 ENCOUNTER — Ambulatory Visit (INDEPENDENT_AMBULATORY_CARE_PROVIDER_SITE_OTHER): Payer: BC Managed Care – PPO | Admitting: Orthopaedic Surgery

## 2021-06-29 ENCOUNTER — Ambulatory Visit: Payer: Self-pay

## 2021-06-29 ENCOUNTER — Encounter: Payer: Self-pay | Admitting: Orthopaedic Surgery

## 2021-06-29 DIAGNOSIS — M79672 Pain in left foot: Secondary | ICD-10-CM | POA: Diagnosis not present

## 2021-06-29 NOTE — Progress Notes (Signed)
Office Visit Note   Patient: Samantha Smith           Date of Birth: 1971-06-28           MRN: [id number] Visit Date: 06/29/2021              Requested by: Colon Branch, [address] [place],  [place] [id number] PCP: Colon Branch, MD   Assessment & Plan: Visit Diagnoses:  1. Pain in left foot     Plan: Impression is left foot tarsal bossing which is progressively worsening over the past few years.  The patient is interested at having these removed.  We have discussed the need to obtain a CT scan of the left foot to further evaluate the size of the nodules.  She will follow-up once this is been completed to further discuss surgical intervention.    Follow-Up Instructions: No follow-ups on file.   Orders:  Orders Placed This Encounter  Procedures   XR Foot Complete Left   No orders of the defined types were placed in this encounter.     Procedures: No procedures performed   Clinical Data: No additional findings.   Subjective: Chief Complaint  Patient presents with   Left Foot - Pain    HPI patient is a pleasant 50 year old female who comes in today with nodules to the dorsum of her left foot.  These began shortly after ORIF left Lisfranc fracture back in November 2015.  She notes that she started out with 1 nodule but over time has developed 2 additional nodules.  All 3 have grown in size and are very tender especially with wearing shoes..  She does not have a history of these anywhere else on her body.  Review of Systems as detailed in HPI.  All others reviewed and are negative.   Objective: Vital Signs: LMP 01/02/2013   Physical Exam well-developed well-nourished female no acute distress.  Alert and oriented x3.  Ortho Exam left foot exam shows 3 pea-sized very tender and mobile nodules around the proximal aspect of the first metatarsal.  Specialty Comments:  No specialty comments available.  Imaging: XR Foot Complete Left  Result  Date: 06/29/2021 X-rays demonstrate moderate degenerative changes to the first metatarsal and cuneiform with dorsal osteophyte formation    PMFS History: Patient Active Problem List   Diagnosis Date Noted   Hypothyroidism 02/12/2017   Gastroesophageal reflux disease without esophagitis 02/12/2017   Hyperlipidemia 02/12/2017   PCP NOTES >>>>>>>>>>>>>>>>>>>>>>>>>>>>>>>>>>>>>>> 04/06/2015   Annual physical exam 12/27/2014   Bilateral ankle fractures 06/13/2014   Cervical cancer ([place]) 02/26/2013   Skin lesion 11/10/2012   Positive PPD    Pseudotumor cerebri    Morbid obesity ([place])    Paresthesias 02/06/2011   Vitamin D deficiency 02/03/2007   History of bariatric surgery 02/03/2007   Past Medical History:  Diagnosis Date   Cervical cancer ([place]) 02/2013   Stage IB1 poorly differentiated squamous cell   Diverticulitis    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   Breast cancer Paternal Grandmother    Coronary artery disease Neg Hx    Stroke Neg Hx    Esophageal cancer Neg Hx     Past Surgical History:  Procedure Laterality Date   ABDOMINAL HYSTERECTOMY  2014   CHOLECYSTECTOMY  12/01   LAPAROSCOPIC GASTRIC BANDING  2008   see OV 06/09/08- ?Malfunction   LAPAROSCOPIC RADICAL TOTAL HYSTERECTOMY W/ NODE BIOPSY, RIGHT SALPINGECTOMY  02/2013   [facility]   ORIF ANKLE FRACTURE Left 06/16/2014   Procedure: OPEN REDUCTION INTERNAL FIXATION (ORIF) LEFT ANKLE, LISFRANC FRACTURE;  Surgeon: Marianna Payment, MD;  Location: [facility];  Service: Orthopedics;  Laterality: Left;   SALPINGECTOMY Left 2011    ectopic pregnancy   TUBAL LIGATION Right 2005   Social History   Occupational History   Occupation: Optometrist    Tobacco Use   Smoking status: Never   Smokeless tobacco: Never  Vaping Use   Vaping Use: Never used  Substance and Sexual Activity   Alcohol use: No    Alcohol/week: 0.0 standard drinks   Drug use: No   Sexual activity: Yes    Birth control/protection: Surgical    Comment: 1st intercourse 50 yo-More than 5 partners, hysterectomy

## 2021-06-30 DIAGNOSIS — Z131 Encounter for screening for diabetes mellitus: Secondary | ICD-10-CM | POA: Diagnosis not present

## 2021-06-30 DIAGNOSIS — E569 Vitamin deficiency, unspecified: Secondary | ICD-10-CM | POA: Diagnosis not present

## 2021-06-30 DIAGNOSIS — Z9884 Bariatric surgery status: Secondary | ICD-10-CM | POA: Diagnosis not present

## 2021-06-30 DIAGNOSIS — E039 Hypothyroidism, unspecified: Secondary | ICD-10-CM | POA: Diagnosis not present

## 2021-07-18 ENCOUNTER — Other Ambulatory Visit: Payer: Self-pay | Admitting: Internal Medicine

## 2021-07-18 DIAGNOSIS — Z1231 Encounter for screening mammogram for malignant neoplasm of breast: Secondary | ICD-10-CM

## 2021-07-20 ENCOUNTER — Other Ambulatory Visit: Payer: Self-pay

## 2021-07-20 ENCOUNTER — Ambulatory Visit
Admission: RE | Admit: 2021-07-20 | Discharge: 2021-07-20 | Disposition: A | Discharge status: Home / Self Care | Payer: BC Managed Care – PPO | Source: Ambulatory Visit | Attending: Internal Medicine | Admitting: Internal Medicine

## 2021-07-20 DIAGNOSIS — Z1231 Encounter for screening mammogram for malignant neoplasm of breast: Secondary | ICD-10-CM

## 2021-07-25 ENCOUNTER — Other Ambulatory Visit: Payer: Self-pay

## 2021-07-25 ENCOUNTER — Ambulatory Visit
Admission: RE | Admit: 2021-07-25 | Discharge: 2021-07-25 | Disposition: A | Discharge status: Home / Self Care | Payer: Self-pay | Source: Ambulatory Visit | Attending: Orthopaedic Surgery | Admitting: Orthopaedic Surgery

## 2021-07-25 DIAGNOSIS — M79672 Pain in left foot: Secondary | ICD-10-CM

## 2021-07-25 DIAGNOSIS — M19072 Primary osteoarthritis, left ankle and foot: Secondary | ICD-10-CM | POA: Diagnosis not present

## 2021-07-28 ENCOUNTER — Ambulatory Visit (INDEPENDENT_AMBULATORY_CARE_PROVIDER_SITE_OTHER): Payer: BC Managed Care – PPO | Admitting: Obstetrics & Gynecology

## 2021-07-28 ENCOUNTER — Other Ambulatory Visit: Payer: Self-pay

## 2021-07-28 ENCOUNTER — Encounter: Payer: Self-pay | Admitting: Obstetrics & Gynecology

## 2021-07-28 ENCOUNTER — Other Ambulatory Visit (HOSPITAL_COMMUNITY)
Admission: RE | Admit: 2021-07-28 | Discharge: 2021-07-28 | Disposition: A | Discharge status: Home / Self Care | Payer: Self-pay | Source: Ambulatory Visit | Attending: Obstetrics & Gynecology | Admitting: Obstetrics & Gynecology

## 2021-07-28 VITALS — BP 108/80 | HR 62 | Resp 16 | Ht 61.75 in | Wt 251.0 lb

## 2021-07-28 DIAGNOSIS — Z1272 Encounter for screening for malignant neoplasm of vagina: Secondary | ICD-10-CM | POA: Diagnosis not present

## 2021-07-28 DIAGNOSIS — Z01419 Encounter for gynecological examination (general) (routine) without abnormal findings: Secondary | ICD-10-CM | POA: Diagnosis not present

## 2021-07-28 DIAGNOSIS — Z8541 Personal history of malignant neoplasm of cervix uteri: Secondary | ICD-10-CM | POA: Insufficient documentation

## 2021-07-28 DIAGNOSIS — Z9884 Bariatric surgery status: Secondary | ICD-10-CM

## 2021-07-28 NOTE — Progress Notes (Signed)
Samantha Smith 06/30/1971 [id number]   History:    51 y.o. G3P3L3  RP:  Established patient presenting for annual gyn exam   HPI:  History of stage IB1 cervical cancer status post robotic radical hysterectomy with lymph node dissection in 2014 at [facility].  PAP Neg 07-27-20.  Breasts normal.  MMG Neg 07-20-21.  Doing well, no concerns other than a bit of chronic deep dyspareunia present since her surgery.  Had Gastric Bypass in 02/2021.  BMI 46.28.  On [place] for weight loss.  Hypothyroidism on Armour Thyroid.  Health labs with Fam MD.  Recommend [facility].  Past medical history,surgical history, family history and social history were all reviewed and documented in the EPIC chart.  Gynecologic History Patient's last menstrual period was 01/02/2013.  Obstetric History OB History  Gravida Para Term Preterm AB Living  3       3 0  SAB IAB Ectopic Multiple Live Births      2        # Outcome Date GA Lbr Len/2nd Weight Sex Delivery Anes PTL Lv  3 Ectopic           2 AB           1 Ectopic              ROS: A ROS was performed and pertinent positives and negatives are included in the history.  GENERAL: No fevers or chills. HEENT: No change in vision, no earache, sore throat or sinus congestion. NECK: No pain or stiffness. CARDIOVASCULAR: No chest pain or pressure. No palpitations. PULMONARY: No shortness of breath, cough or wheeze. GASTROINTESTINAL: No abdominal pain, nausea, vomiting or diarrhea, melena or bright red blood per rectum. GENITOURINARY: No urinary frequency, urgency, hesitancy or dysuria. MUSCULOSKELETAL: No joint or muscle pain, no back pain, no recent trauma. DERMATOLOGIC: No rash, no itching, no lesions. ENDOCRINE: No polyuria, polydipsia, no heat or cold intolerance. No recent change in weight. HEMATOLOGICAL: No anemia or easy bruising or bleeding. NEUROLOGIC: No headache, seizures, numbness, tingling or weakness. PSYCHIATRIC: No depression, no loss of interest in normal activity  or change in sleep pattern.     Exam:   BP 108/80    Pulse 62    Resp 16    Ht 5' 1.75" (1.568 m)    Wt 251 lb (113.9 kg)    LMP 01/02/2013    BMI 46.28 kg/m   Body mass index is 46.28 kg/m.  General appearance : Well developed well nourished female. No acute distress HEENT: Eyes: no retinal hemorrhage or exudates,  Neck supple, trachea midline, no carotid bruits, no thyroidmegaly Lungs: Clear to auscultation, no rhonchi or wheezes, or rib retractions  Heart: Regular rate and rhythm, no murmurs or gallops Breast:Examined in sitting and supine position were symmetrical in appearance, no palpable masses or tenderness,  no skin retraction, no nipple inversion, no nipple discharge, no skin discoloration, no axillary or supraclavicular lymphadenopathy Abdomen: no palpable masses or tenderness, no rebound or guarding Extremities: no edema or skin discoloration or tenderness  Pelvic: Vulva: Normal             Vagina: No gross lesions or discharge.  Pap reflex done.  Cervix/Uterus absent  Adnexa  Without masses or tenderness  Anus: Normal   Assessment/Plan:  51 y.o. female for annual exam   1. Encounter for Papanicolaou smear of vagina as part of routine gynecological examination History of stage IB1 cervical cancer status post robotic radical hysterectomy  with lymph node dissection in 2014 at [facility]  PAP Neg 07-27-20.  Breasts normal.  MMG Neg 07-20-21.  Doing well, no concerns other than a bit of chronic deep dyspareunia present since her surgery.  Had Gastric Bypass in 02/2021.  BMI 46.28.  On [place] for weight loss.  Hypothyroidism on [place] Thyroid.  Health labs with Fam MD.  Recommend [place]. - Cytology - PAP( [place])  2. History of cervical cancer - Cytology - PAP( [facility])  3. History of bariatric surgery Gastric Bypass 02/2021.  Other orders - LOMAIRA 8 MG TABS; Take by mouth.   Princess Bruins MD, 10:02 AM 07/28/2021

## 2021-07-31 LAB — CYTOLOGY - PAP: Diagnosis: NEGATIVE

## 2021-08-01 ENCOUNTER — Encounter: Payer: Self-pay | Admitting: Orthopaedic Surgery

## 2021-08-01 ENCOUNTER — Ambulatory Visit (INDEPENDENT_AMBULATORY_CARE_PROVIDER_SITE_OTHER): Payer: Self-pay | Admitting: Orthopaedic Surgery

## 2021-08-01 ENCOUNTER — Other Ambulatory Visit: Payer: Self-pay

## 2021-08-01 DIAGNOSIS — M898X7 Other specified disorders of bone, ankle and foot: Secondary | ICD-10-CM

## 2021-08-01 NOTE — Progress Notes (Signed)
Office Visit Note   Patient: Samantha Smith           Date of Birth: 26-Jul-1970           MRN: [id number] Visit Date: 08/01/2021              Requested by: Colon Branch, [address] [place],  [place] [id number] PCP: Colon Branch, MD   Assessment & Plan: Visit Diagnoses:  1. Exostosis of left foot     Plan: Samantha Smith returns today to discuss CT scan of the left foot.  Left foot shows 2 prominent areas consistent with bony exostoses.  These are very superficial and the overlying skin is irritated.  The CT scan does show degenerative changes of the Lisfranc joint however her symptoms are more consistent with the exostoses.  At this point she has failed conservative management and would like to have the exostoses removed in the near future.  Risk benefits rehab reviewed with the patient detail.  Plan will be for exostosis removal and tenolysis of extensor tendons.  Follow-Up Instructions: No follow-ups on file.   Orders:  No orders of the defined types were placed in this encounter.  No orders of the defined types were placed in this encounter.     Procedures: No procedures performed   Clinical Data: No additional findings.   Subjective: Chief Complaint  Patient presents with   Left Foot - Pain, Follow-up    HPI  Review of Systems   Objective: Vital Signs: LMP 01/02/2013   Physical Exam  Ortho Exam  Specialty Comments:  No specialty comments available.  Imaging: No results found.   PMFS History: Patient Active Problem List   Diagnosis Date Noted   Hypothyroidism 02/12/2017   Gastroesophageal reflux disease without esophagitis 02/12/2017   Hyperlipidemia 02/12/2017   PCP NOTES >>>>>>>>>>>>>>>>>>>>>>>>>>>>>>>>>>>>>>> 04/06/2015   Annual physical exam 12/27/2014   Bilateral ankle fractures 06/13/2014   Cervical cancer ([place]) 02/26/2013   Skin lesion 11/10/2012   Positive PPD    Pseudotumor cerebri    Morbid obesity ([place])     Paresthesias 02/06/2011   Vitamin D deficiency 02/03/2007   History of bariatric surgery 02/03/2007   Past Medical History:  Diagnosis Date   Cervical cancer ([place]) 02/2013   Stage IB1 poorly differentiated squamous cell   Diverticulitis    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)    Positive PPD    s/p abx per patient   Pseudotumor cerebri    STD (sexually transmitted disease)    Chlamydia history   Thyroid disease     Family History  Problem Relation Age of Onset   Breast cancer Mother 71       [name]   Hypertension Father    Heart disease Sister    Diabetes Maternal Grandmother    Breast cancer Paternal Grandmother    Colon cancer Paternal Grandfather        dx age 48s?   Esophageal cancer Neg Hx     Past Surgical History:  Procedure Laterality Date   ABDOMINAL HYSTERECTOMY  2014   CHOLECYSTECTOMY  06/2000   LAPAROSCOPIC GASTRIC BANDING  2008   see OV 06/09/08- ?Malfunction   LAPAROSCOPIC GASTRIC SLEEVE RESECTION  02/20/2021   LAPAROSCOPIC RADICAL TOTAL HYSTERECTOMY W/ NODE BIOPSY, RIGHT SALPINGECTOMY  02/2013   [facility]   ORIF ANKLE FRACTURE Left 06/16/2014   Procedure: OPEN REDUCTION INTERNAL FIXATION (ORIF) LEFT ANKLE,  LISFRANC FRACTURE;  Surgeon: Marianna Payment, MD;  Location: [facility];  Service: Orthopedics;  Laterality: Left;   SALPINGECTOMY Left 2011   ectopic pregnancy   TUBAL LIGATION Right 2005   Social History   Occupational History   Occupation: Optometrist    Tobacco Use   Smoking status: Never   Smokeless tobacco: Never  Vaping Use   Vaping Use: Never used  Substance and Sexual Activity   Alcohol use: No    Alcohol/week: 0.0 standard drinks   Drug use: No   Sexual activity: Yes    Birth control/protection: Surgical    Comment: 1st intercourse 51 yo-More than 5 partners, hysterectomy

## 2021-08-03 DIAGNOSIS — E569 Vitamin deficiency, unspecified: Secondary | ICD-10-CM | POA: Diagnosis not present

## 2021-08-03 DIAGNOSIS — E039 Hypothyroidism, unspecified: Secondary | ICD-10-CM | POA: Diagnosis not present

## 2021-08-03 DIAGNOSIS — Z13220 Encounter for screening for lipoid disorders: Secondary | ICD-10-CM | POA: Diagnosis not present

## 2021-08-03 DIAGNOSIS — R635 Abnormal weight gain: Secondary | ICD-10-CM | POA: Diagnosis not present

## 2021-08-03 DIAGNOSIS — Z9884 Bariatric surgery status: Secondary | ICD-10-CM | POA: Diagnosis not present

## 2021-08-03 DIAGNOSIS — Z6841 Body Mass Index (BMI) 40.0 and over, adult: Secondary | ICD-10-CM | POA: Diagnosis not present

## 2021-08-03 DIAGNOSIS — Z131 Encounter for screening for diabetes mellitus: Secondary | ICD-10-CM | POA: Diagnosis not present

## 2021-08-31 DIAGNOSIS — K219 Gastro-esophageal reflux disease without esophagitis: Secondary | ICD-10-CM | POA: Diagnosis not present

## 2021-08-31 DIAGNOSIS — Z6841 Body Mass Index (BMI) 40.0 and over, adult: Secondary | ICD-10-CM | POA: Diagnosis not present

## 2021-09-28 DIAGNOSIS — Z9884 Bariatric surgery status: Secondary | ICD-10-CM | POA: Diagnosis not present

## 2021-09-28 DIAGNOSIS — E039 Hypothyroidism, unspecified: Secondary | ICD-10-CM | POA: Diagnosis not present

## 2021-12-23 ENCOUNTER — Other Ambulatory Visit: Payer: Self-pay

## 2021-12-23 ENCOUNTER — Emergency Department (HOSPITAL_BASED_OUTPATIENT_CLINIC_OR_DEPARTMENT_OTHER): Payer: BC Managed Care – PPO

## 2021-12-23 ENCOUNTER — Encounter (HOSPITAL_BASED_OUTPATIENT_CLINIC_OR_DEPARTMENT_OTHER): Payer: Self-pay | Admitting: Emergency Medicine

## 2021-12-23 DIAGNOSIS — W208XXA Other cause of strike by thrown, projected or falling object, initial encounter: Secondary | ICD-10-CM | POA: Diagnosis not present

## 2021-12-23 DIAGNOSIS — S0990XA Unspecified injury of head, initial encounter: Secondary | ICD-10-CM | POA: Diagnosis not present

## 2021-12-23 DIAGNOSIS — W19XXXA Unspecified fall, initial encounter: Secondary | ICD-10-CM | POA: Diagnosis not present

## 2021-12-23 DIAGNOSIS — S0083XA Contusion of other part of head, initial encounter: Secondary | ICD-10-CM | POA: Insufficient documentation

## 2021-12-23 DIAGNOSIS — S0003XA Contusion of scalp, initial encounter: Secondary | ICD-10-CM | POA: Diagnosis not present

## 2021-12-23 DIAGNOSIS — M7981 Nontraumatic hematoma of soft tissue: Secondary | ICD-10-CM | POA: Diagnosis not present

## 2021-12-23 DIAGNOSIS — R58 Hemorrhage, not elsewhere classified: Secondary | ICD-10-CM | POA: Diagnosis not present

## 2021-12-23 DIAGNOSIS — R609 Edema, unspecified: Secondary | ICD-10-CM | POA: Diagnosis not present

## 2021-12-23 MED ORDER — ACETAMINOPHEN 325 MG PO TABS
650.0000 mg | ORAL_TABLET | Freq: Once | ORAL | Status: AC
  Administered 2021-12-23: 650 mg via ORAL
  Filled 2021-12-23: qty 2

## 2021-12-23 NOTE — ED Triage Notes (Signed)
Pt w/ swelling to nose and LT eye after a decoration fell off top of a bookcase and hit her; no LOC; no active bleeding at this time

## 2021-12-24 ENCOUNTER — Emergency Department (HOSPITAL_BASED_OUTPATIENT_CLINIC_OR_DEPARTMENT_OTHER)
Admission: EM | Admit: 2021-12-24 | Discharge: 2021-12-24 | Disposition: A | Discharge status: Home / Self Care | Payer: BC Managed Care – PPO | Attending: Emergency Medicine | Admitting: Emergency Medicine

## 2021-12-24 DIAGNOSIS — S0083XA Contusion of other part of head, initial encounter: Secondary | ICD-10-CM

## 2021-12-24 NOTE — ED Provider Notes (Signed)
[facility] Provider Note   CSN: [id number] Arrival date & time: 12/23/21  2105     History  Chief Complaint  Patient presents with   Facial Injury    Samantha Smith is a 51 y.o. female.  Patient is a 51 year old female presenting with complaints of a facial injury.  A decoration fell off the top of a bookcase and struck her over the left eye and bridge of the nose.  There is no loss of consciousness, but she did have some bleeding from the left nares and a small puncture wound to the bridge of the nose.  Bleeding was controlled with direct pressure.  There was no loss of consciousness, but she does describe some headache.  The history is provided by the patient.      Home Medications Prior to Admission medications   Medication Sig Start Date End Date Taking? Authorizing Provider  LOMAIRA 8 MG TABS Take by mouth. 06/11/21   [provider]  Multiple Vitamin (MULTIVITAMIN PO) Take by mouth.    [provider]  thyroid (ARMOUR) 60 MG tablet Take 60 mg by mouth daily.    [provider]      Allergies    Codeine, Sulfasalazine, Chlorhexidine gluconate, Demeclocycline, Hydrocodone, Tetracyclines & related, and Adhesive [tape]    Review of Systems   Review of Systems  All other systems reviewed and are negative.  Physical Exam Updated Vital Signs BP 129/82   Pulse 67   Temp 98.5 F (36.9 C) (Oral)   Resp 20   Ht '5\' 3"'$  (1.6 m)   Wt 113.4 kg   LMP 01/02/2013   SpO2 96%   BMI 44.29 kg/m  Physical Exam Vitals and nursing note reviewed.  Constitutional:      General: She is not in acute distress.    Appearance: Normal appearance. She is not ill-appearing.  HENT:     Head: Normocephalic.     Comments: There is swelling to the bridge of the nose and the left eyebrow/eyelid.  She has full range of motion of the eye muscles with no diplopia.    Nose:     Comments: There is swelling to the bridge of the nose, but  no septal hematoma. Eyes:     Extraocular Movements: Extraocular movements intact.     Pupils: Pupils are equal, round, and reactive to light.  Pulmonary:     Effort: Pulmonary effort is normal.  Skin:    General: Skin is warm and dry.  Neurological:     General: No focal deficit present.     Mental Status: She is alert and oriented to person, place, and time. Mental status is at baseline.     Cranial Nerves: No cranial nerve deficit.     Motor: No weakness.    ED Results / Procedures / Treatments   Labs (all labs ordered are listed, but only abnormal results are displayed) Labs Reviewed - No data to display  EKG None  Radiology CT Maxillofacial Wo Contrast  Result Date: 12/23/2021 CLINICAL DATA:  Status post trauma. EXAM: CT MAXILLOFACIAL WITHOUT CONTRAST TECHNIQUE: Multidetector CT imaging of the maxillofacial structures was performed. Multiplanar CT image reconstructions were also generated. RADIATION DOSE REDUCTION: This exam was performed according to the departmental dose-optimization program which includes automated exposure control, adjustment of the mA and/or kV according to patient size and/or use of iterative reconstruction technique. COMPARISON:  None Available. FINDINGS: Osseous: No fracture or mandibular dislocation. No destructive  process. Orbits: Negative. No traumatic or inflammatory finding. Sinuses: Clear. Soft tissues: There is mild to moderate severity right supra orbital and right frontal scalp soft tissue swelling with an associated 1.7 cm x 0.8 cm x 0.8 cm soft tissue hematoma. Limited intracranial: No significant or unexpected finding. IMPRESSION: 1. Mild to moderate severity right supra orbital and right frontal scalp soft tissue swelling with an associated 1.7 cm x 0.8 cm x 0.8 cm soft tissue hematoma. 2. No acute fracture or dislocation of the facial bones. Electronically Signed   By: Virgina Norfolk M.D.   On: 12/23/2021 23:00    Procedures Procedures     Medications Ordered in ED Medications  acetaminophen (TYLENOL) tablet 650 mg (650 mg Oral Given 12/23/21 2221)    ED Course/ Medical Decision Making/ A&P  CT scan shows no fracture.  These will be treated as facial contusions with rest, ice, and follow-up as needed.  Final Clinical Impression(s) / ED Diagnoses Final diagnoses:  None    Rx / DC Orders ED Discharge Orders     None         Veryl Speak, MD 12/24/21 9604

## 2021-12-24 NOTE — Discharge Instructions (Signed)
Ice for 20 minutes every 2 hours while awake for the next 2 days.  Take ibuprofen 600 mg every 6 hours as needed for pain.  Follow-up with primary doctor for any new and/or concerning symptoms.

## 2021-12-28 ENCOUNTER — Ambulatory Visit (INDEPENDENT_AMBULATORY_CARE_PROVIDER_SITE_OTHER): Payer: BC Managed Care – PPO | Admitting: Internal Medicine

## 2021-12-28 ENCOUNTER — Encounter: Payer: Self-pay | Admitting: Internal Medicine

## 2021-12-28 VITALS — BP 126/84 | HR 65 | Temp 98.0°F | Resp 16 | Ht 61.75 in | Wt 254.0 lb

## 2021-12-28 DIAGNOSIS — E559 Vitamin D deficiency, unspecified: Secondary | ICD-10-CM | POA: Diagnosis not present

## 2021-12-28 DIAGNOSIS — Z23 Encounter for immunization: Secondary | ICD-10-CM

## 2021-12-28 DIAGNOSIS — E785 Hyperlipidemia, unspecified: Secondary | ICD-10-CM | POA: Diagnosis not present

## 2021-12-28 DIAGNOSIS — Z1211 Encounter for screening for malignant neoplasm of colon: Secondary | ICD-10-CM

## 2021-12-28 DIAGNOSIS — Z0000 Encounter for general adult medical examination without abnormal findings: Secondary | ICD-10-CM | POA: Diagnosis not present

## 2021-12-28 DIAGNOSIS — E039 Hypothyroidism, unspecified: Secondary | ICD-10-CM

## 2021-12-28 LAB — CBC WITH DIFFERENTIAL/PLATELET
Basophils Absolute: 0.1 10*3/uL (ref 0.0–0.1)
Basophils Relative: 1.1 % (ref 0.0–3.0)
Eosinophils Absolute: 0.1 10*3/uL (ref 0.0–0.7)
Eosinophils Relative: 1.5 % (ref 0.0–5.0)
HCT: 42 % (ref 36.0–46.0)
Hemoglobin: 13.6 g/dL (ref 12.0–15.0)
Lymphocytes Relative: 33.7 % (ref 12.0–46.0)
Lymphs Abs: 2.3 10*3/uL (ref 0.7–4.0)
MCHC: 32.3 g/dL (ref 30.0–36.0)
MCV: 94.9 fl (ref 78.0–100.0)
Monocytes Absolute: 0.3 10*3/uL (ref 0.1–1.0)
Monocytes Relative: 4.8 % (ref 3.0–12.0)
Neutro Abs: 4.1 10*3/uL (ref 1.4–7.7)
Neutrophils Relative %: 58.9 % (ref 43.0–77.0)
Platelets: 303 10*3/uL (ref 150.0–400.0)
RBC: 4.43 Mil/uL (ref 3.87–5.11)
RDW: 14 % (ref 11.5–15.5)
WBC: 7 10*3/uL (ref 4.0–10.5)

## 2021-12-28 LAB — COMPREHENSIVE METABOLIC PANEL
ALT: 15 U/L (ref 0–35)
AST: 15 U/L (ref 0–37)
Albumin: 4 g/dL (ref 3.5–5.2)
Alkaline Phosphatase: 99 U/L (ref 39–117)
BUN: 12 mg/dL (ref 6–23)
CO2: 29 mEq/L (ref 19–32)
Calcium: 9.4 mg/dL (ref 8.4–10.5)
Chloride: 103 mEq/L (ref 96–112)
Creatinine, Ser: 0.54 mg/dL (ref 0.40–1.20)
GFR: 107.54 mL/min (ref >60.00)
Glucose, Bld: 83 mg/dL (ref 70–99)
Potassium: 4.3 mEq/L (ref 3.5–5.1)
Sodium: 140 mEq/L (ref 135–145)
Total Bilirubin: 0.5 mg/dL (ref 0.2–1.2)
Total Protein: 6.7 g/dL (ref 6.0–8.3)

## 2021-12-28 LAB — LIPID PANEL
Cholesterol: 193 mg/dL (ref 0–200)
HDL: 59.6 mg/dL (ref >39.00)
LDL Cholesterol: 107 mg/dL — ABNORMAL HIGH (ref 0–99)
NonHDL: 133.29
Total CHOL/HDL Ratio: 3
Triglycerides: 130 mg/dL (ref 0.0–149.0)
VLDL: 26 mg/dL (ref 0.0–40.0)

## 2021-12-28 LAB — VITAMIN D 25 HYDROXY (VIT D DEFICIENCY, FRACTURES): VITD: 21.52 ng/mL — ABNORMAL LOW (ref 30.00–100.00)

## 2021-12-28 NOTE — Assessment & Plan Note (Signed)
Here for CPX Hypothyroidism: Managed elsewhere. Morbid obesity, had a gastric sleeve August 2022.  Is losing weight, encouraged to continue her journey. Vitamin D deficiency: On MVI daily, labs. Facial injury: Had a contusion at the left face few days ago, no LOC, went to the ER, CT no fractures, doing better RTC 1 year.  Sooner if needed.

## 2021-12-28 NOTE — Assessment & Plan Note (Signed)
-  Td 2019 - PNM 23 : 2016 - shingrix d/w pt.  Proceed with #1 today, next in 3 months - [place] up-to-date, had a bivalent shot -Female care:  per gynecology, personal h/o cervical cancer + FH breast ca: [facility] 06-2021 per K PN. -CCS: No Cscope, h/o diverticulitis, referral to GI failure x3, request another referral  - Recent labs reviewed, will check a Vitamin D, CMP, FLP CBC  -Diet and exercise: Encouraged to continue doing well.

## 2021-12-28 NOTE — Progress Notes (Signed)
Subjective:    Patient ID: Samantha Smith, female    DOB: [birth year], 51 y.o.   MRN: [id number]  DOS:  12/28/2021 Type of visit - description: cpx  Here for CPX Had bariatric surgery few months ago, doing well. Specifically denies nausea vomiting, dysphagia or diarrhea.  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   Morbid obesity ([place])    Positive PPD    s/p abx per patient   Pseudotumor cerebri    STD (sexually transmitted disease)    Chlamydia history   Thyroid disease     Past Surgical History:  Procedure Laterality Date   ABDOMINAL HYSTERECTOMY  2014   CHOLECYSTECTOMY  06/2000   LAPAROSCOPIC GASTRIC BANDING  2008   see OV 06/09/08- ?Malfunction   LAPAROSCOPIC GASTRIC SLEEVE RESECTION  02/20/2021   LAPAROSCOPIC RADICAL TOTAL HYSTERECTOMY W/ NODE BIOPSY, RIGHT SALPINGECTOMY  02/2013   [facility]   ORIF ANKLE FRACTURE Left 06/16/2014   Procedure: OPEN REDUCTION INTERNAL FIXATION (ORIF) LEFT ANKLE, LISFRANC FRACTURE;  Surgeon: Marianna Payment, MD;  Location: [facility];  Service: Orthopedics;  Laterality: Left;   SALPINGECTOMY Left 2011   ectopic pregnancy   TUBAL LIGATION Right 2005   Social History   Socioeconomic History   Marital status: Married    Spouse name: Not on file   Number of children: 0   Years of education: Not on file   Highest education level: Not on file  Occupational History   Occupation: Accountant    Tobacco Use   Smoking status: Never   Smokeless tobacco: Never  Vaping Use   Vaping Use: Never used  Substance and Sexual Activity   Alcohol use: No    Alcohol/week: 0.0 standard drinks of alcohol   Drug use: No    Sexual activity: Yes    Birth control/protection: Surgical    Comment: 1st intercourse 51 yo-More than 5 partners, hysterectomy  Other Topics Concern   Not on file  Social History Narrative   Lives w/ husband   Social Determinants of Health   Financial Resource Strain: Not on file  Food Insecurity: Not on file  Transportation Needs: Not on file  Physical Activity: Not on file  Stress: Not on file  Social Connections: Not on file  Intimate Partner Violence: Not on file    Current Outpatient Medications  Medication Instructions   Multiple Vitamin (MULTIVITAMIN PO) Oral   thyroid (ARMOUR) 60 mg, Oral, Daily       Objective:   Physical Exam BP 126/84   Pulse 65   Temp 98 F (36.7 C) (Oral)   Resp 16   Ht 5' 1.75" (1.568 m)   Wt 254 lb (115.2 kg)   LMP 01/02/2013   SpO2 96%   BMI 46.83 kg/m  General: Well developed, NAD, BMI noted Neck: No  thyromegaly  HEENT:  Normocephalic . Face symmetric, ecchymoses noted around the left eye  lungs:  CTA B Normal respiratory effort, no intercostal retractions, no accessory muscle use. Heart: RRR,  no murmur.  Abdomen:  Not distended, soft, non-tender. No rebound or  rigidity.   Lower extremities: no pretibial edema bilaterally  Skin: Exposed areas without rash. Not pale. Not jaundice Neurologic:  alert & oriented X3.  Speech normal, gait appropriate for age and unassisted Strength symmetric and appropriate for age.  Psych: Cognition and judgment appear intact.  Cooperative with normal attention span and concentration.  Behavior appropriate. No anxious or depressed appearing.     Assessment    ASSESSMENT Hypothyroid + PPD, s/p antibiotics Cervical cancer, laparoscopic radical total hysterectomy 02-2013 H/o STDs, HPV + Pseudotumor Cerebri- dx 2014, see neuro note 11-06-12 (also saw 2 additional neurologist including Dr Harvel Ricks 11-22-15) Morbid Obesity:gastric banding ~ 2006 ([place]), removed 06/2018 ([facility]), schedule  gastric sleeve 02/2021 H/o vit d def   PLAN: Here for CPX Hypothyroidism: Managed elsewhere. Morbid obesity, had a gastric sleeve August 2022.  Is losing weight, encouraged to continue her journey. Vitamin D deficiency: On MVI daily, labs. Facial injury: Had a contusion at the left face few days ago, no LOC, went to the ER, CT no fractures, doing better RTC 1 year.  Sooner if needed.

## 2021-12-28 NOTE — Patient Instructions (Addendum)
You are up-to-date on your COVID vaccines  Continue eating healthy  We are referring you to gastroenterology for a colonoscopy.  Please call them at [phone]  [facility] : Get the blood work     [place], Elfrida back for a physical exam in 1 year  Come back in 3 months for a nurse visit, you need your second Shingrix vaccine.

## 2021-12-29 MED ORDER — VITAMIN D (ERGOCALCIFEROL) 1.25 MG (50000 UNIT) PO CAPS: 50000.0000 [IU] | ORAL_CAPSULE | ORAL | 0 refills | Status: DC

## 2021-12-29 NOTE — Addendum Note (Signed)
Addended byDamita Dunnings D on: 12/29/2021 08:14 AM   Modules accepted: Orders

## 2022-01-24 DIAGNOSIS — E039 Hypothyroidism, unspecified: Secondary | ICD-10-CM | POA: Diagnosis not present

## 2022-01-29 ENCOUNTER — Encounter: Payer: Self-pay | Admitting: Internal Medicine

## 2022-01-29 ENCOUNTER — Ambulatory Visit (INDEPENDENT_AMBULATORY_CARE_PROVIDER_SITE_OTHER): Payer: BC Managed Care – PPO | Admitting: Internal Medicine

## 2022-01-29 VITALS — BP 132/70 | HR 65 | Temp 98.0°F | Resp 16 | Ht 61.75 in | Wt 255.4 lb

## 2022-01-29 DIAGNOSIS — E039 Hypothyroidism, unspecified: Secondary | ICD-10-CM

## 2022-01-29 DIAGNOSIS — R519 Headache, unspecified: Secondary | ICD-10-CM | POA: Diagnosis not present

## 2022-01-29 DIAGNOSIS — Z8639 Personal history of other endocrine, nutritional and metabolic disease: Secondary | ICD-10-CM

## 2022-01-29 DIAGNOSIS — F0781 Postconcussional syndrome: Secondary | ICD-10-CM | POA: Diagnosis not present

## 2022-01-29 DIAGNOSIS — Z6841 Body Mass Index (BMI) 40.0 and over, adult: Secondary | ICD-10-CM

## 2022-01-29 MED ORDER — GABAPENTIN 100 MG PO CAPS: 100.0000 mg | ORAL_CAPSULE | Freq: Three times a day (TID) | ORAL | 3 refills | Status: DC

## 2022-01-29 NOTE — Progress Notes (Unsigned)
Subjective:    Patient ID: Samantha Smith, female    DOB: July 14, 1971, 51 y.o.   MRN: [id number]  DOS:  01/29/2022 Type of visit - description: Acute  Went to the ER 12/23/2021 after he reacquisition fell near her left eye, left temple, left scalp. Had a hematoma. CT was done.  See report.  When I saw her for a physical exam few days later she felt she was doing okay however at this point she still have persistent numbness around the left eye, also the left side of the head is very sensitive to touch (for instance when she tried to wear glasses), she also has very sharp pains on and off at the left temporal area.  No fever chills No neck pain per se No upper extremity paresthesias No visual disturbances No actually global headache or nausea or vomiting. When asked, admits to some L TMJ pain with chewing.   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   Morbid obesity ([place])    Positive PPD    s/p abx per patient   Pseudotumor cerebri    STD (sexually transmitted disease)    Chlamydia history   Thyroid disease     Past Surgical History:  Procedure Laterality Date   ABDOMINAL HYSTERECTOMY  2014   CHOLECYSTECTOMY  06/2000   LAPAROSCOPIC GASTRIC BANDING  2008   see OV 06/09/08- ?Malfunction   LAPAROSCOPIC GASTRIC SLEEVE RESECTION  02/20/2021   LAPAROSCOPIC RADICAL TOTAL HYSTERECTOMY W/ NODE BIOPSY, RIGHT SALPINGECTOMY  02/2013   [facility]   ORIF ANKLE FRACTURE Left 06/16/2014   Procedure: OPEN REDUCTION INTERNAL FIXATION (ORIF) LEFT ANKLE, LISFRANC FRACTURE;  Surgeon: Marianna Payment, MD;  Location: [facility];  Service: Orthopedics;  Laterality: Left;   SALPINGECTOMY Left 2011   ectopic pregnancy   TUBAL LIGATION Right 2005    Current Outpatient Medications  Medication Instructions   Multiple Vitamin (MULTIVITAMIN PO) Oral    thyroid (ARMOUR) 60 mg, Oral, Daily   Vitamin D (Ergocalciferol) (DRISDOL) 50,000 Units, Oral, Every 7 days       Objective:   Physical Exam HENT:     Head:      Comments: Left side of the head, this is a area where she has symptoms. Upon palpation, above the left eyebrow there is minimal swelling, probably a remnant of the hematoma. No rash observed BP 132/70   Pulse 65   Temp 98 F (36.7 C) (Oral)   Resp 16   Ht 5' 1.75" (1.568 m)   Wt 255 lb 6 oz (115.8 kg)   LMP 01/02/2013   SpO2 98%   BMI 47.09 kg/m  General:   Well developed, NAD, BMI noted. HEENT:  Normocephalic . Face  atraumatic.  EOMI. TMJ: No click, moderately TTP on the left. Neck: Range of motion is normal, no TTP of the cervical spine Lower extremities: no pretibial edema bilaterally  Skin: Not pale. Not jaundice Neurologic:  alert & oriented X3.  Speech normal, gait appropriate for age and unassisted. Motor and DTR symmetric Psych--  Cognition and judgment appear intact.  Cooperative with normal attention span and concentration.  Behavior appropriate. No anxious or depressed appearing.      Assessment     ASSESSMENT Hypothyroid + PPD, s/p antibiotics Cervical cancer, laparoscopic radical total hysterectomy 02-2013 H/o STDs,  HPV + Pseudotumor Cerebri- dx 2014, see neuro note 11-06-12 (also saw 2 additional neurologist including Dr Harvel Ricks 11-22-15) Morbid Obesity:gastric banding ~ 2006 ([place]), removed 06/2018 ([facility]), schedule gastric sleeve 02/2021 H/o vit d def   PLAN: Postconcussion pain. The patient had a injury at the left face, left the scalp, no LOC, went to the ER 12/23/2021, CT:  1. Mild to moderate severity right supra orbital and right frontal scalp soft tissue swelling with an associated 1.7 cm x 0.8 cm x 0.8 cm soft tissue hematoma. 2. No acute fracture or dislocation of the facial bones.  Here complaining of numbness and pain at the left scalp as described above.  Suspect pain is  neuropathic from residual inflammation/injury to the area. L TMJ may be also playing a role. Plan: Start gabapentin gradually, see AVS. Referred to neurology    6-8 Here for CPX Hypothyroidism: Managed elsewhere. Morbid obesity, had a gastric sleeve August 2022.  Is losing weight, encouraged to continue her journey. Vitamin D deficiency: On MVI daily, labs. Facial injury: Had a contusion at the left face few days ago, no LOC, went to the ER, CT no fractures, doing better RTC 1 year.  Sooner if needed.

## 2022-01-29 NOTE — Patient Instructions (Addendum)
Start gabapentin 100 mg: - Take 1 tablet at bedtime for few days - Then take 1 tablet twice daily - Then take 1 tablet 3 times a day.  We are referring you to neurology  Please contact [facility] at [phone] to schedule your colonoscopy.

## 2022-01-30 NOTE — Assessment & Plan Note (Signed)
Postconcussion pain. The patient had a injury at the left face, left scalp, no LOC, went to the ER 12/23/2021, CT: 1. Mild to moderate severity right supra orbital and right frontal scalp soft tissue swelling with an associated 1.7 cm x 0.8 cm x 0.8 cm soft tissue hematoma. 2. No acute fracture or dislocation of the facial bones. Here c/o numbness and pain at the left scalp as described above.  Suspect pain is neuropathic from residual inflammation/injury to the area. L TMJ may be also playing a role. Plan: Start gabapentin gradually, see AVS. Referred to neurology

## 2022-02-07 ENCOUNTER — Ambulatory Visit: Payer: BC Managed Care – PPO | Admitting: Obstetrics & Gynecology

## 2022-02-07 ENCOUNTER — Encounter: Payer: Self-pay | Admitting: Obstetrics & Gynecology

## 2022-02-07 VITALS — BP 120/76 | Temp 97.7°F

## 2022-02-07 DIAGNOSIS — N898 Other specified noninflammatory disorders of vagina: Secondary | ICD-10-CM

## 2022-02-07 DIAGNOSIS — R350 Frequency of micturition: Secondary | ICD-10-CM | POA: Diagnosis not present

## 2022-02-07 MED ORDER — NITROFURANTOIN MONOHYD MACRO 100 MG PO CAPS: 100.0000 mg | ORAL_CAPSULE | Freq: Two times a day (BID) | ORAL | 0 refills | Status: AC

## 2022-02-07 MED ORDER — FLUCONAZOLE 150 MG PO TABS: 150.0000 mg | ORAL_TABLET | ORAL | 1 refills | Status: AC

## 2022-02-07 NOTE — Progress Notes (Signed)
    Samantha Smith 03-10-1971 [id number]        51 y.o.  G3P0A3L0   RP: Urinary frequency and dysuria  HPI: Patient c/o urinary frequency, urgency, pain with urination & vaginal itching.  No fever.  Took Azo.   OB History  Gravida Para Term Preterm AB Living  3       3 0  SAB IAB Ectopic Multiple Live Births  1   2        # Outcome Date GA Lbr Len/2nd Weight Sex Delivery Anes PTL Lv  3 Ectopic           2 SAB           1 Ectopic             Past medical history,surgical history, problem list, medications, allergies, family history and social history were all reviewed and documented in the EPIC chart.   Directed ROS with pertinent positives and negatives documented in the history of present illness/assessment and plan.  Exam:  Vitals:   02/07/22 1356  BP: 120/76  Temp: 97.7 F (36.5 C)  TempSrc: Oral   General appearance:  Normal  CVAT Negative bilaterally  Gynecologic exam: Vulva normal  U/A: Orange cloudy secondary to Azo.  No biochemical due to Azo.  White blood cells 20-40, red blood cells 0-2, bacteria many.  Pending urine culture.   Assessment/Plan:  51 y.o. G3P0030   1. Urinary frequency Patient c/o urinary frequency, urgency, pain with urination.  No fever.  Took Azo.  CVAT Neg bilaterally.  U/A c/w acute cystitis.  Will treat with Bactrim DS 1 tab BID, usage reviewed, prescription sent to pharmacy.  Pending U. Culture. - Urinalysis,Complete w/RFL Culture  2. Vaginal itching Vulvovaginal itching.  Will treat with Fluconazole 150 mg PO every other day after Bactrim Ds treatment.   Other orders - UNABLE TO FIND; Med Name: azo   Princess Bruins MD, 2:10 PM 02/07/2022

## 2022-02-10 LAB — URINALYSIS, COMPLETE W/RFL CULTURE
Casts: NONE SEEN /LPF
Crystals: NONE SEEN /HPF
Hyaline Cast: NONE SEEN /LPF
Yeast: NONE SEEN /HPF

## 2022-02-10 LAB — CULTURE INDICATED

## 2022-02-10 LAB — URINE CULTURE
MICRO NUMBER:: 13667353
SPECIMEN QUALITY:: ADEQUATE

## 2022-03-05 ENCOUNTER — Encounter: Payer: Self-pay | Admitting: Internal Medicine

## 2022-03-30 ENCOUNTER — Ambulatory Visit (INDEPENDENT_AMBULATORY_CARE_PROVIDER_SITE_OTHER): Payer: BC Managed Care – PPO

## 2022-03-30 DIAGNOSIS — Z23 Encounter for immunization: Secondary | ICD-10-CM

## 2022-03-30 NOTE — Progress Notes (Signed)
Samantha Smith is a 51 y.o. female presents to the office today for 2nd Shingrix injections, per physician's orders. Original order: 12/28/21 Zoster Recombinant  0.5 mL (dose),  IM  (route) was administered L Deltoid today. Patient tolerated injection. Patient due for follow up labs/provider appt: No. Date due:   Creft, Kristine Garbe L

## 2022-05-31 ENCOUNTER — Other Ambulatory Visit: Payer: Self-pay | Admitting: Internal Medicine

## 2022-06-06 ENCOUNTER — Other Ambulatory Visit: Payer: Self-pay | Admitting: Internal Medicine

## 2022-06-06 DIAGNOSIS — Z1231 Encounter for screening mammogram for malignant neoplasm of breast: Secondary | ICD-10-CM

## 2022-07-01 ENCOUNTER — Telehealth: Payer: BC Managed Care – PPO | Admitting: Physician Assistant

## 2022-07-01 DIAGNOSIS — J111 Influenza due to unidentified influenza virus with other respiratory manifestations: Secondary | ICD-10-CM

## 2022-07-01 MED ORDER — ALBUTEROL SULFATE HFA 108 (90 BASE) MCG/ACT IN AERS: 1.0000 | INHALATION_SPRAY | Freq: Four times a day (QID) | RESPIRATORY_TRACT | 0 refills | Status: DC | PRN

## 2022-07-01 MED ORDER — OSELTAMIVIR PHOSPHATE 75 MG PO CAPS: 75.0000 mg | ORAL_CAPSULE | Freq: Two times a day (BID) | ORAL | 0 refills | Status: DC

## 2022-07-01 MED ORDER — BENZONATATE 100 MG PO CAPS: 100.0000 mg | ORAL_CAPSULE | Freq: Three times a day (TID) | ORAL | 0 refills | Status: DC | PRN

## 2022-07-01 NOTE — Progress Notes (Signed)
Virtual Visit Consent   Samantha Smith, you are scheduled for a virtual visit with a [facility] provider today. Just as with appointments in the office, your consent must be obtained to participate. Your consent will be active for this visit and any virtual visit you may have with one of our providers in the next 365 days. If you have a MyChart account, a copy of this consent can be sent to you electronically.  As this is a virtual visit, video technology does not allow for your provider to perform a traditional examination. This may limit your provider's ability to fully assess your condition. If your provider identifies any concerns that need to be evaluated in person or the need to arrange testing (such as labs, EKG, etc.), we will make arrangements to do so. Although advances in technology are sophisticated, we cannot ensure that it will always work on either your end or our end. If the connection with a video visit is poor, the visit may have to be switched to a telephone visit. With either a video or telephone visit, we are not always able to ensure that we have a secure connection.  By engaging in this virtual visit, you consent to the provision of healthcare and authorize for your insurance to be billed (if applicable) for the services provided during this visit. Depending on your insurance coverage, you may receive a charge related to this service.  I need to obtain your verbal consent now. Are you willing to proceed with your visit today? Samantha Smith has provided verbal consent on 07/01/2022 for a virtual visit (video or telephone). Mar Daring, PA-C  Date: 07/01/2022 1:13 PM  Virtual Visit via Video Note   I, Mar Daring, connected with  Samantha Smith  ([id number], 02-25-1971) on 07/01/22 at  1:00 PM EST by a video-enabled telemedicine application and verified that I am speaking with the correct person using two identifiers.  Location: Patient: Virtual Visit  Location Patient: Home Provider: Virtual Visit Location Provider: Home Office   I discussed the limitations of evaluation and management by telemedicine and the availability of in person appointments. The patient expressed understanding and agreed to proceed.    History of Present Illness: Samantha Smith is a 51 y.o. who identifies as a female who was assigned female at birth, and is being seen today for possible influenza.  HPI: Influenza This is a new problem. The current episode started yesterday. The problem occurs constantly. The problem has been gradually worsening. Associated symptoms include chest pain (tightness), chills, congestion, coughing, fatigue, a fever, headaches and myalgias. Pertinent negatives include no nausea, sore throat, vomiting or weakness. Associated symptoms comments: diarrhea. Nothing aggravates the symptoms. Treatments tried: nyquil. The treatment provided no relief.  Has had positive exposures with Husband and employees with work   Problems:  Patient Active Problem List   Diagnosis Date Noted   Hypothyroidism 02/12/2017   Gastroesophageal reflux disease without esophagitis 02/12/2017   Hyperlipidemia 02/12/2017   PCP NOTES >>>>>>>>>>>>>>>>>>>>>>>>>>>>>>>>>>>>>>> 04/06/2015   Annual physical exam 12/27/2014   Bilateral ankle fractures 06/13/2014   Cervical cancer ([place]) 02/26/2013   Skin lesion 11/10/2012   Positive PPD    Pseudotumor cerebri    Morbid obesity ([place])    Paresthesias 02/06/2011   Vitamin D deficiency 02/03/2007   History of bariatric surgery 02/03/2007    Allergies:  Allergies  Allergen Reactions   Codeine Anaphylaxis   Sulfasalazine Other (See Comments) and Rash  blisters   Chlorhexidine Gluconate Rash    Inflammation, blisters, rash, itching   Demeclocycline     Other reaction(s): Other (See Comments) [place]?   Hydrocodone    Tetracyclines & Related Other (See Comments)    Pseudo tumor   Adhesive [Tape] Hives    Medications:  Current Outpatient Medications:    albuterol (VENTOLIN HFA) 108 (90 Base) MCG/ACT inhaler, Inhale 1-2 puffs into the lungs every 6 (six) hours as needed., Disp: 8 g, Rfl: 0   benzonatate (TESSALON) 100 MG capsule, Take 1 capsule (100 mg total) by mouth 3 (three) times daily as needed., Disp: 30 capsule, Rfl: 0   oseltamivir (TAMIFLU) 75 MG capsule, Take 1 capsule (75 mg total) by mouth 2 (two) times daily., Disp: 10 capsule, Rfl: 0   Multiple Vitamin (MULTIVITAMIN PO), Take by mouth., Disp: , Rfl:    thyroid (ARMOUR) 60 MG tablet, Take 60 mg by mouth daily., Disp: , Rfl:    UNABLE TO FIND, Med Name: azo, Disp: , Rfl:    Vitamin D, Ergocalciferol, (DRISDOL) 1.25 MG (50000 UNIT) CAPS capsule, Take 1 capsule (50,000 Units total) by mouth every 7 (seven) days., Disp: 12 capsule, Rfl: 0  Observations/Objective: Patient is well-developed, well-nourished in no acute distress.  Resting comfortably at home.  Head is normocephalic, atraumatic.  No labored breathing.  Speech is clear and coherent with logical content.  Patient is alert and oriented at baseline.    Assessment and Plan: 1. Influenza - oseltamivir (TAMIFLU) 75 MG capsule; Take 1 capsule (75 mg total) by mouth 2 (two) times daily.  Dispense: 10 capsule; Refill: 0 - albuterol (VENTOLIN HFA) 108 (90 Base) MCG/ACT inhaler; Inhale 1-2 puffs into the lungs every 6 (six) hours as needed.  Dispense: 8 g; Refill: 0 - benzonatate (TESSALON) 100 MG capsule; Take 1 capsule (100 mg total) by mouth 3 (three) times daily as needed.  Dispense: 30 capsule; Refill: 0  - Suspected viral URI with negative covid testing - Possible flu as flu A is prevalent in the community at this time - Limited testing availability that would delay appropriate treatment waiting on results - Tamiflu prescribed for possible flu - Work note provided - Push fluids - Symptomatic management OTC of choice as needed - Seek in person evaluation if symptoms  worsen or fail to improve   Follow Up Instructions: I discussed the assessment and treatment plan with the patient. The patient was provided an opportunity to ask questions and all were answered. The patient agreed with the plan and demonstrated an understanding of the instructions.  A copy of instructions were sent to the patient via MyChart unless otherwise noted below.    The patient was advised to call back or seek an in-person evaluation if the symptoms worsen or if the condition fails to improve as anticipated.  Time:  I spent 8 minutes with the patient via telehealth technology discussing the above problems/concerns.    Mar Daring, PA-C

## 2022-07-01 NOTE — Patient Instructions (Signed)
Samantha Smith, thank you for joining Samantha Daring, PA-C for today's virtual visit.  While this provider is not your primary care provider (PCP), if your PCP is located in our provider database this encounter information will be shared with them immediately following your visit.   Samantha Smith account gives you access to today's visit and all your visits, tests, and labs performed at [facility] " click here if you don't have a Samantha Smith account or go to [email or web address]  Consent: (Patient) Samantha Smith provided verbal consent for this virtual visit at the beginning of the encounter.  Current Medications:  Current Outpatient Medications:    albuterol (VENTOLIN HFA) 108 (90 Base) MCG/ACT inhaler, Inhale 1-2 puffs into the lungs every 6 (six) hours as needed., Disp: 8 g, Rfl: 0   benzonatate (TESSALON) 100 MG capsule, Take 1 capsule (100 mg total) by mouth 3 (three) times daily as needed., Disp: 30 capsule, Rfl: 0   oseltamivir (TAMIFLU) 75 MG capsule, Take 1 capsule (75 mg total) by mouth 2 (two) times daily., Disp: 10 capsule, Rfl: 0   Multiple Vitamin (MULTIVITAMIN PO), Take by mouth., Disp: , Rfl:    thyroid (ARMOUR) 60 MG tablet, Take 60 mg by mouth daily., Disp: , Rfl:    UNABLE TO FIND, Med Name: azo, Disp: , Rfl:    Vitamin D, Ergocalciferol, (DRISDOL) 1.25 MG (50000 UNIT) CAPS capsule, Take 1 capsule (50,000 Units total) by mouth every 7 (seven) days., Disp: 12 capsule, Rfl: 0   Medications ordered in this encounter:  Meds ordered this encounter  Medications   oseltamivir (TAMIFLU) 75 MG capsule    Sig: Take 1 capsule (75 mg total) by mouth 2 (two) times daily.    Dispense:  10 capsule    Refill:  0    Order Specific Question:   Supervising Provider    Answer:   Samantha Smith   albuterol (VENTOLIN HFA) 108 (90 Base) MCG/ACT inhaler    Sig: Inhale 1-2 puffs into the lungs every 6 (six) hours as needed.     Dispense:  8 g    Refill:  0    Order Specific Question:   Supervising Provider    Answer:   Samantha Smith [[id number]]   benzonatate (TESSALON) 100 MG capsule    Sig: Take 1 capsule (100 mg total) by mouth 3 (three) times daily as needed.    Dispense:  30 capsule    Refill:  0    Order Specific Question:   Supervising Provider    Answer:   Samantha Smith [id number]     *If you need refills on other medications prior to your next appointment, please contact your pharmacy*  Follow-Up: Call back or seek an in-person evaluation if the symptoms worsen or if the condition fails to improve as anticipated.  [name] [phone]  Other Instructions Influenza, Adult Influenza, also called "the flu," is a viral infection that mainly affects the respiratory tract. This includes the lungs, nose, and throat. The flu spreads easily from person to person (is contagious). It causes common cold symptoms, along with high fever and body aches. What are the causes? This condition is caused by the influenza virus. You can get the virus by: Breathing in droplets that are in the air from an infected person's cough or sneeze. Touching something that has the virus on it (has been contaminated) and then touching your mouth, nose, or eyes.  What increases the risk? The following factors may make you more likely to get the flu: Not washing or sanitizing your hands often. Having close contact with many people during cold and flu season. Touching your mouth, eyes, or nose without first washing or sanitizing your hands. Not getting an annual flu shot. You may have a higher risk for the flu, including serious problems, such as a lung infection (pneumonia), if you: Are older than 65. Are pregnant. Have a weakened disease-fighting system (immune system). This includes people who have HIV or AIDS, are on chemotherapy, or are taking medicines that reduce (suppress) the immune system. Have a  long-term (chronic) illness, such as heart disease, kidney disease, diabetes, or lung disease. Have a liver disorder. Are severely overweight (morbidly obese). Have anemia. Have asthma. What are the signs or symptoms? Symptoms of this condition usually begin suddenly and last 4-14 days. These may include: Fever and chills. Headaches, body aches, or muscle aches. Sore throat. Cough. Runny or stuffy (congested) nose. Chest discomfort. Poor appetite. Weakness or fatigue. Dizziness. Nausea or vomiting. How is this diagnosed? This condition may be diagnosed based on: Your symptoms and medical history. A physical exam. Swabbing your nose or throat and testing the fluid for the influenza virus. How is this treated? If the flu is diagnosed early, you can be treated with antiviral medicine that is given by mouth (orally) or through an IV. This can help reduce how severe the illness is and how long it lasts. Taking care of yourself at home can help relieve symptoms. Your health care provider may recommend: Taking over-the-counter medicines. Drinking plenty of fluids. In many cases, the flu goes away on its own. If you have severe symptoms or complications, you may be treated in a hospital. Follow these instructions at home: Activity Rest as needed and get plenty of sleep. Stay home from work or school as told by your health care provider. Unless you are visiting your health care provider, avoid leaving home until your fever has been gone for 24 hours without taking medicine. Eating and drinking Take an oral rehydration solution (ORS). This is a drink that is sold at pharmacies and retail stores. Drink enough fluid to keep your urine pale yellow. Drink clear fluids in small amounts as you are able. Clear fluids include water, ice chips, fruit juice mixed with water, and low-calorie sports drinks. Eat bland, easy-to-digest foods in small amounts as you are able. These foods include bananas,  applesauce, rice, lean meats, toast, and crackers. Avoid drinking fluids that contain a lot of sugar or caffeine, such as energy drinks, regular sports drinks, and soda. Avoid alcohol. Avoid spicy or fatty foods. General instructions     Take over-the-counter and prescription medicines only as told by your health care provider. Use a cool mist humidifier to add humidity to the air in your home. This can make it easier to breathe. When using a cool mist humidifier, clean it daily. Empty the water and replace it with clean water. Cover your mouth and nose when you cough or sneeze. Wash your hands with soap and water often and for at least 20 seconds, especially after you cough or sneeze. If soap and water are not available, use alcohol-based hand sanitizer. Keep all follow-up visits. This is important. How is this prevented?  Get an annual flu shot. This is usually available in late summer, fall, or winter. Ask your health care provider when you should get your flu shot. Avoid contact with  people who are sick during cold and flu season. This is generally fall and winter. Contact a health care provider if: You develop new symptoms. You have: Chest pain. Diarrhea. A fever. Your cough gets worse. You produce more mucus. You feel nauseous or you vomit. Get help right away if you: Develop shortness of breath or have difficulty breathing. Have skin or nails that turn a bluish color. Have severe pain or stiffness in your neck. Develop a sudden headache or sudden pain in your face or ear. Cannot eat or drink without vomiting. These symptoms may represent a serious problem that is an emergency. Do not wait to see if the symptoms will go away. Get medical help right away. Call your local emergency services (911 in the [place] Do not drive yourself to the hospital. Summary Influenza, also called "the flu," is a viral infection that primarily affects your respiratory tract. Symptoms of the flu  usually begin suddenly and last 4-14 days. Getting an annual flu shot is the best way to prevent getting the flu. Stay home from work or school as told by your health care provider. Unless you are visiting your health care provider, avoid leaving home until your fever has been gone for 24 hours without taking medicine. Keep all follow-up visits. This is important. This information is not intended to replace advice given to you by your health care provider. Make sure you discuss any questions you have with your health care provider. Document Revised: 02/26/2020 Document Reviewed: 02/26/2020 Elsevier Patient Education  [place].    If you have been instructed to have an in-person evaluation today at a local Urgent Care facility, please use the link below. It will take you to a list of all of our available [facility], including address, phone number and hours of operation. Please do not delay care.  [facility]  If you or a family member do not have a primary care provider, use the link below to schedule a visit and establish care. When you choose a [facility] primary care physician or advanced practice provider, you gain a long-term partner in health. Find a Primary Care Provider  Learn more about [facility] in-office and virtual care options: [facility]

## 2022-07-28 DIAGNOSIS — E039 Hypothyroidism, unspecified: Secondary | ICD-10-CM | POA: Diagnosis not present

## 2022-07-28 DIAGNOSIS — Z6841 Body Mass Index (BMI) 40.0 and over, adult: Secondary | ICD-10-CM | POA: Diagnosis not present

## 2022-07-28 DIAGNOSIS — E785 Hyperlipidemia, unspecified: Secondary | ICD-10-CM | POA: Diagnosis not present

## 2022-07-28 DIAGNOSIS — R7309 Other abnormal glucose: Secondary | ICD-10-CM | POA: Diagnosis not present

## 2022-07-28 DIAGNOSIS — Z0001 Encounter for general adult medical examination with abnormal findings: Secondary | ICD-10-CM | POA: Diagnosis not present

## 2022-08-01 ENCOUNTER — Encounter: Payer: Self-pay | Admitting: Obstetrics & Gynecology

## 2022-08-01 ENCOUNTER — Other Ambulatory Visit (HOSPITAL_COMMUNITY)
Admission: RE | Admit: 2022-08-01 | Discharge: 2022-08-01 | Disposition: A | Discharge status: Home / Self Care | Payer: BC Managed Care – PPO | Source: Ambulatory Visit | Attending: Obstetrics & Gynecology | Admitting: Obstetrics & Gynecology

## 2022-08-01 ENCOUNTER — Ambulatory Visit (INDEPENDENT_AMBULATORY_CARE_PROVIDER_SITE_OTHER): Payer: BC Managed Care – PPO | Admitting: Obstetrics & Gynecology

## 2022-08-01 VITALS — HR 69 | Ht 62.0 in | Wt 263.0 lb

## 2022-08-01 DIAGNOSIS — C530 Malignant neoplasm of endocervix: Secondary | ICD-10-CM | POA: Insufficient documentation

## 2022-08-01 DIAGNOSIS — Z1272 Encounter for screening for malignant neoplasm of vagina: Secondary | ICD-10-CM

## 2022-08-01 DIAGNOSIS — Z01419 Encounter for gynecological examination (general) (routine) without abnormal findings: Secondary | ICD-10-CM | POA: Insufficient documentation

## 2022-08-01 DIAGNOSIS — Z90710 Acquired absence of both cervix and uterus: Secondary | ICD-10-CM

## 2022-08-01 DIAGNOSIS — Z9884 Bariatric surgery status: Secondary | ICD-10-CM

## 2022-08-01 NOTE — Progress Notes (Signed)
Samantha Smith [birth year] [id number]   History:    52 y.o.  G3P3L3   RP:  Established patient presenting for annual gyn exam    HPI:  History of stage IB1 cervical cancer status post robotic radical hysterectomy with lymph node dissection in 2014 at [facility].  PAP Neg 07/2021.  Pap reflex today.  Breasts normal.  MMG Neg 07-20-21, scheduled for this month.  Doing well, no concerns other than a bit of chronic deep dyspareunia present since her surgery.  Will try coconut oil, with massage of perineum where tends to have fissures. Had Gastric Bypass in 02/2021.  BMI increasing again to 48.10 now.  Appointment with the Bariatric team in 08/2022.  Hypothyroidism on Armour Thyroid. Health labs with Fam MD.  Scheduling [facility].  Flu vaccine done at pharmacy.   Past medical history,surgical history, family history and social history were all reviewed and documented in the EPIC chart.  Gynecologic History Patient's last menstrual period was 01/02/2013.  Obstetric History OB History  Gravida Para Term Preterm AB Living  3       3 0  SAB IAB Ectopic Multiple Live Births  1   2        # Outcome Date GA Lbr Len/2nd Weight Sex Delivery Anes PTL Lv  3 Ectopic           2 SAB           1 Ectopic              ROS: A ROS was performed and pertinent positives and negatives are included in the history. GENERAL: No fevers or chills. HEENT: No change in vision, no earache, sore throat or sinus congestion. NECK: No pain or stiffness. CARDIOVASCULAR: No chest pain or pressure. No palpitations. PULMONARY: No shortness of breath, cough or wheeze. GASTROINTESTINAL: No abdominal pain, nausea, vomiting or diarrhea, melena or bright red blood per rectum. GENITOURINARY: No urinary frequency, urgency, hesitancy or dysuria. MUSCULOSKELETAL: No joint or muscle pain, no back pain, no recent trauma. DERMATOLOGIC: No rash, no itching, no lesions. ENDOCRINE: No polyuria, polydipsia, no heat or cold intolerance. No recent change  in weight. HEMATOLOGICAL: No anemia or easy bruising or bleeding. NEUROLOGIC: No headache, seizures, numbness, tingling or weakness. PSYCHIATRIC: No depression, no loss of interest in normal activity or change in sleep pattern.     Exam:   Pulse 69   Ht '5\' 2"'$  (1.575 m)   Wt 263 lb (119.3 kg)   LMP 01/02/2013   SpO2 99%   BMI 48.10 kg/m   Body mass index is 48.1 kg/m.  General appearance : Well developed well nourished female. No acute distress HEENT: Eyes: no retinal hemorrhage or exudates,  Neck supple, trachea midline, no carotid bruits, no thyroidmegaly Lungs: Clear to auscultation, no rhonchi or wheezes, or rib retractions  Heart: Regular rate and rhythm, no murmurs or gallops Breast:Examined in sitting and supine position were symmetrical in appearance, no palpable masses or tenderness,  no skin retraction, no nipple inversion, no nipple discharge, no skin discoloration, no axillary or supraclavicular lymphadenopathy Abdomen: no palpable masses or tenderness, no rebound or guarding Extremities: no edema or skin discoloration or tenderness  Pelvic: Vulva: Normal.  Perineum intact, no current fissure.             Vagina: No gross lesions or discharge.  Pap reflex done.  Cervix/Uterus absent  Adnexa  Without masses or tenderness  Anus: Normal   Assessment/Plan:  52 y.o. female  for annual exam   1. Encounter for Papanicolaou smear of vagina as part of routine gynecological examination History of stage IB1 cervical cancer status post robotic radical hysterectomy with lymph node dissection in 2014 at [facility]  PAP Neg 07/2021.  Pap reflex today.  Breasts normal.  MMG Neg 07-20-21, scheduled for this month.  Doing well, no concerns other than a bit of chronic deep dyspareunia present since her surgery.  Will try coconut oil, with massage of perineum where tends to have fissures. Had Gastric Bypass in 02/2021.  BMI increasing again to 48.10 now.  Appointment with the Bariatric team in  08/2022.  Hypothyroidism on Armour Thyroid. Health labs with Fam MD.  Scheduling [facility].  Flu vaccine done at pharmacy. - Cytology - PAP( [place])  2. Malignant neoplasm of endocervix (HCC) - Cytology - PAP( [place])  3. History of radical hysterectomy  4. History of bariatric surgery  Appointment with Bariatric team scheduled in 08/2022.  Princess Bruins MD, 9:27 AM

## 2022-08-02 LAB — CYTOLOGY - PAP: Diagnosis: NEGATIVE

## 2022-08-08 ENCOUNTER — Ambulatory Visit
Admission: RE | Admit: 2022-08-08 | Discharge: 2022-08-08 | Disposition: A | Discharge status: Home / Self Care | Payer: BC Managed Care – PPO | Source: Ambulatory Visit | Attending: Internal Medicine | Admitting: Internal Medicine

## 2022-08-08 DIAGNOSIS — E785 Hyperlipidemia, unspecified: Secondary | ICD-10-CM | POA: Diagnosis not present

## 2022-08-08 DIAGNOSIS — E039 Hypothyroidism, unspecified: Secondary | ICD-10-CM | POA: Diagnosis not present

## 2022-08-08 DIAGNOSIS — Z1231 Encounter for screening mammogram for malignant neoplasm of breast: Secondary | ICD-10-CM

## 2022-08-08 DIAGNOSIS — E559 Vitamin D deficiency, unspecified: Secondary | ICD-10-CM | POA: Diagnosis not present

## 2022-08-08 DIAGNOSIS — R7309 Other abnormal glucose: Secondary | ICD-10-CM | POA: Diagnosis not present

## 2022-09-18 IMAGING — CT CT MAXILLOFACIAL W/O CM
3 series · 15 of 47 positions shown, 18 images · non-contrast
Comparison: None Available.

CLINICAL DATA: Status post trauma.



[Series 2: max soft · axial · 0.35mm/px · z∈[-158,-34]mm · 9 of 74 slices shown, 12 images]
[im 6/74  brain]
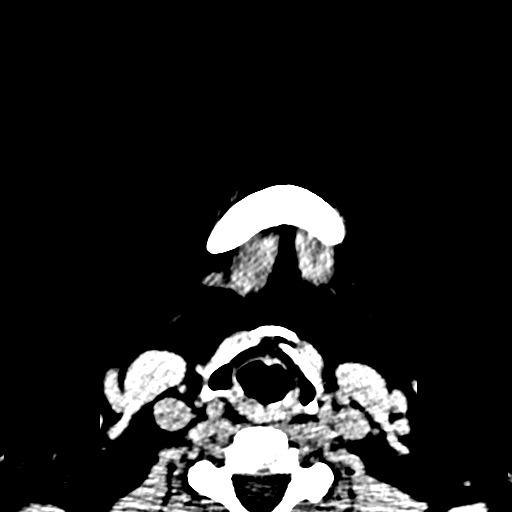
[im 6/74  bone]
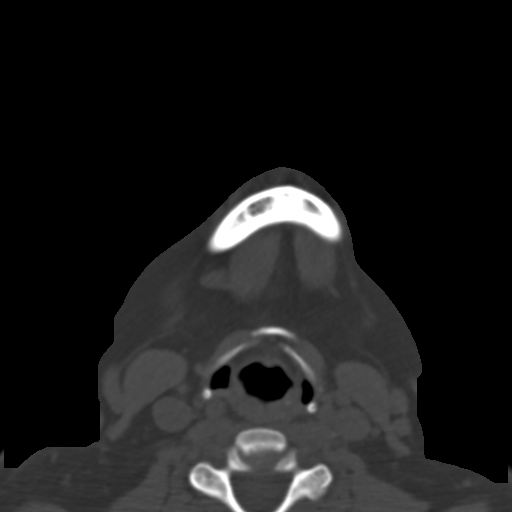
[im 13/74  bone]
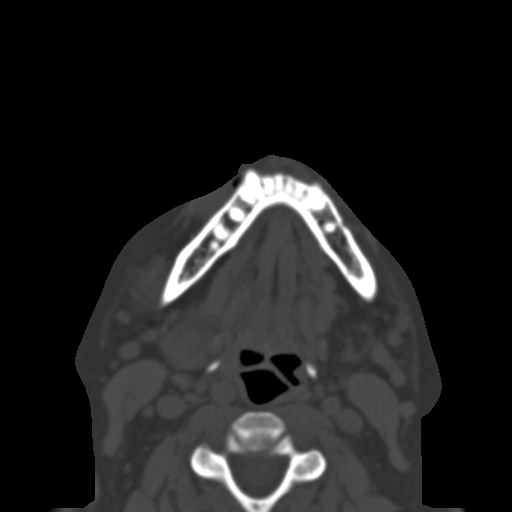
[im 21/74  bone]
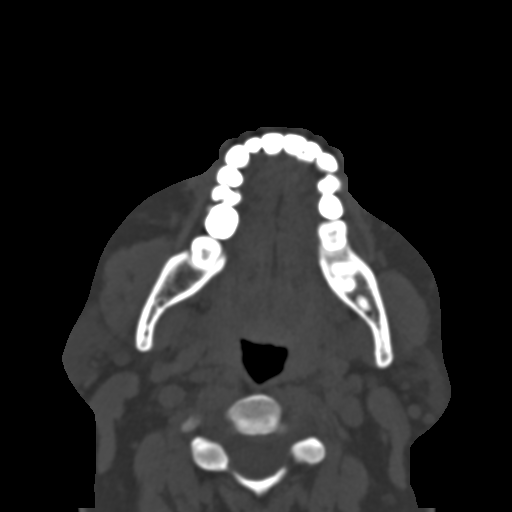
[im 28/74  bone]
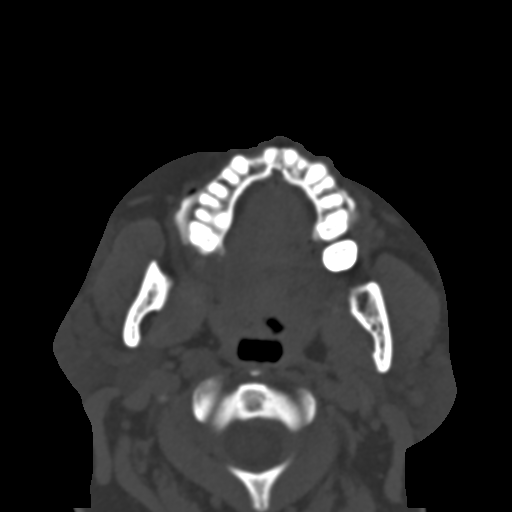
[im 38/74  brain]
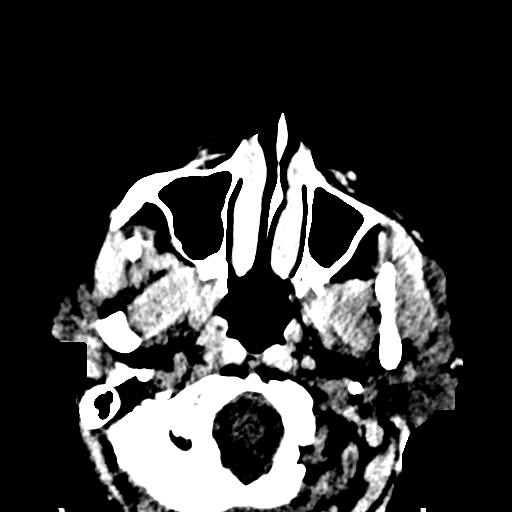
[im 38/74  bone]
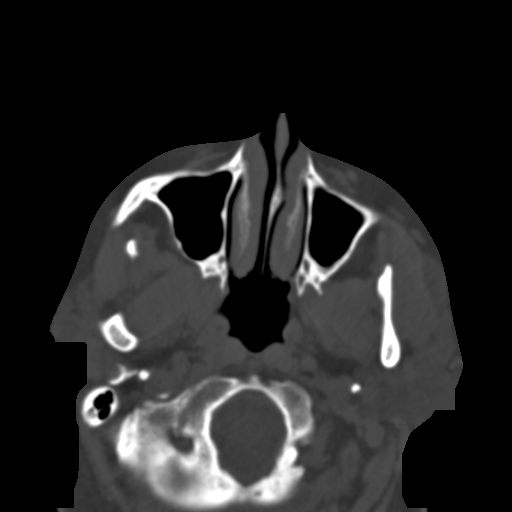
[im 46/74  bone]
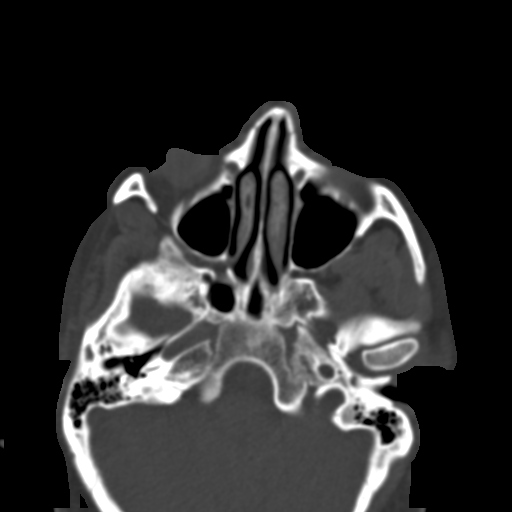
[im 53/74  bone]
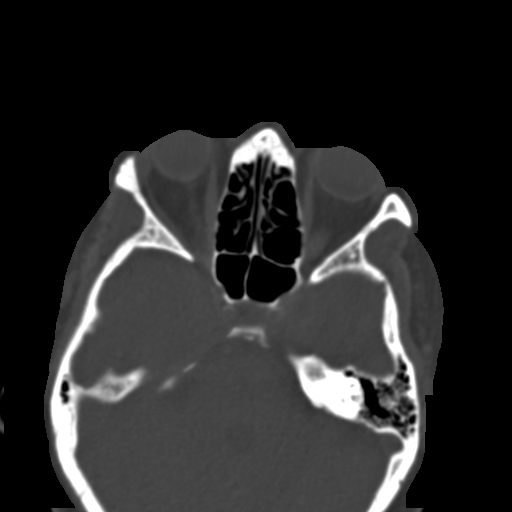
[im 61/74  bone]
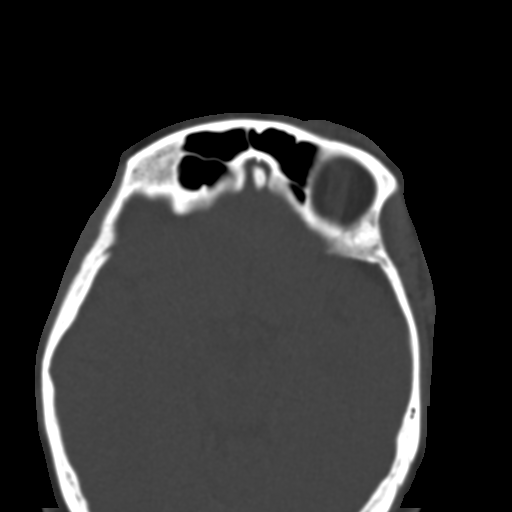
[im 68/74  brain]
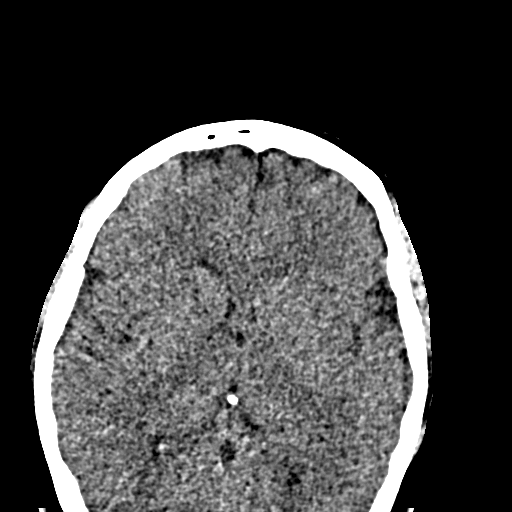
[im 68/74  bone]
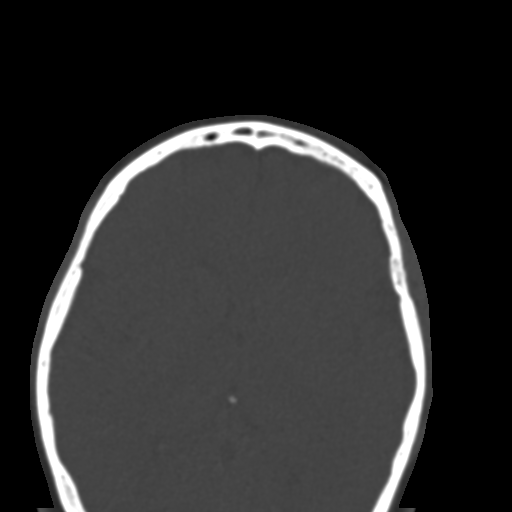

[Series 6: coronal soft · coronal · 0.34mm/px · 3 of 69 slices shown]
[im 23/69  bone]
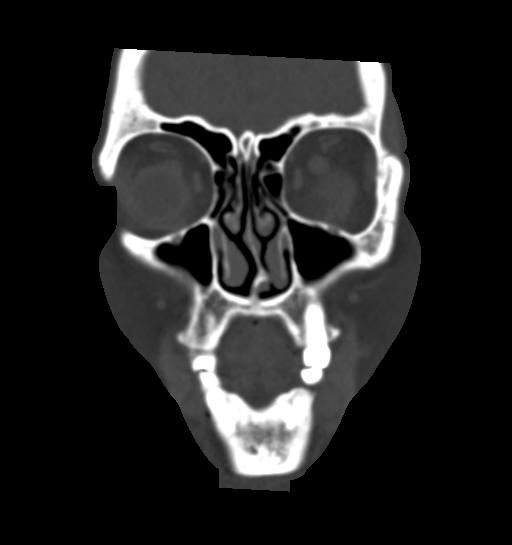
[im 31/69  bone]
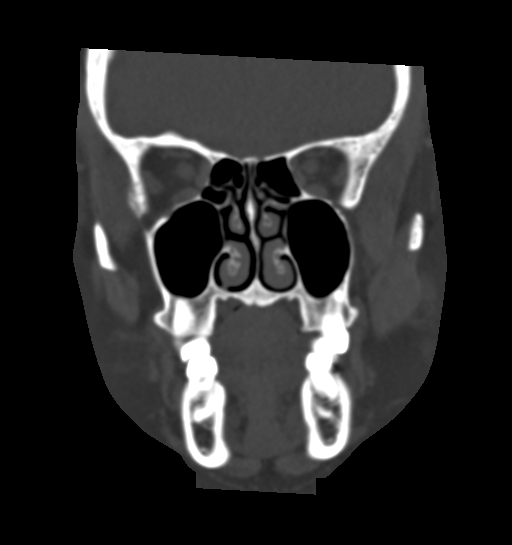
[im 38/69  bone]
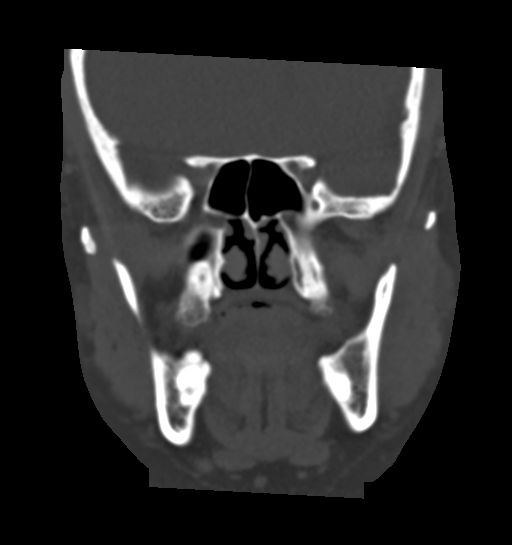

[Series 7: sagittal soft · sagittal · 0.27mm/px · 3 of 85 slices shown]
[im 29/85  bone]
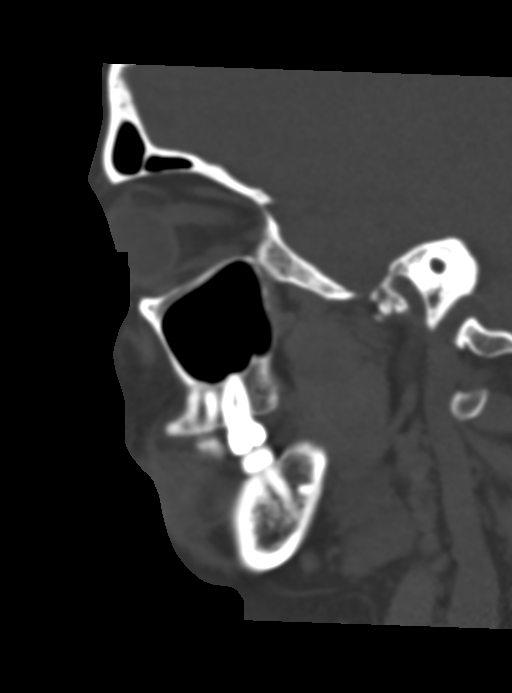
[im 43/85  bone]
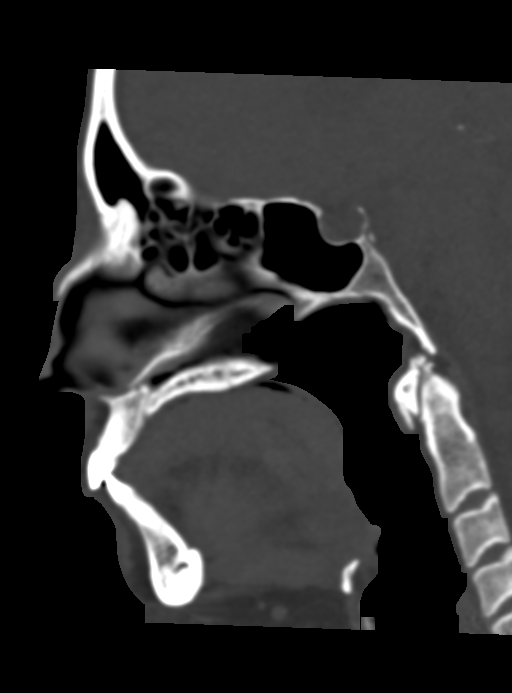
[im 57/85  bone]
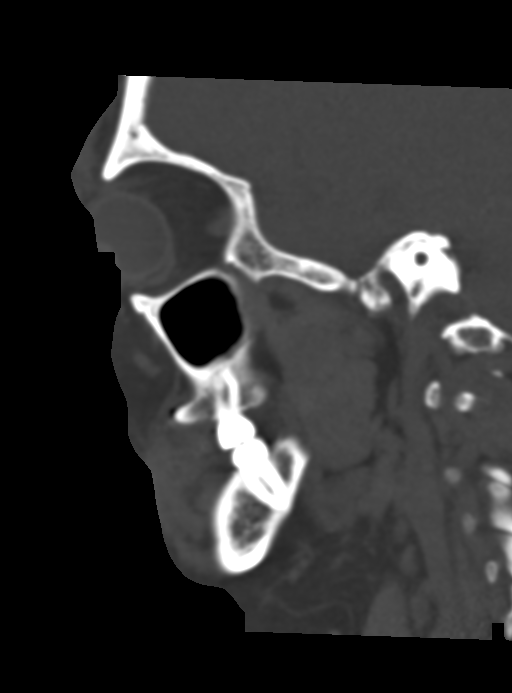

[15 of 47 positions shown; findings below may reference images not displayed]

FINDINGS: Osseous: No fracture or mandibular dislocation. No destructive
process.

Orbits: Negative. No traumatic or inflammatory finding.

Sinuses: Clear.

Soft tissues: There is mild to moderate severity right supra orbital
and right frontal scalp soft tissue swelling with an associated
cm x 0.8 cm x 0.8 cm soft tissue hematoma.

Limited intracranial: No significant or unexpected finding.
IMPRESSION: 1. Mild to moderate severity right supra orbital and right frontal
scalp soft tissue swelling with an associated 1.7 cm x 0.8 cm x
cm soft tissue hematoma.
2. No acute fracture or dislocation of the facial bones.

## 2022-09-26 ENCOUNTER — Encounter: Payer: Self-pay | Admitting: Orthopaedic Surgery

## 2022-09-26 ENCOUNTER — Ambulatory Visit: Payer: BC Managed Care – PPO | Admitting: Orthopaedic Surgery

## 2022-09-26 DIAGNOSIS — M19072 Primary osteoarthritis, left ankle and foot: Secondary | ICD-10-CM

## 2022-09-26 DIAGNOSIS — M898X7 Other specified disorders of bone, ankle and foot: Secondary | ICD-10-CM

## 2022-09-26 NOTE — Progress Notes (Signed)
Office Visit Note   Patient: Samantha Smith           Date of Birth: 11-16-1970           MRN: [id number] Visit Date: 09/26/2022              Requested by: Colon Branch, [address] [place],  [place] [address] PCP: Colon Branch, MD   Assessment & Plan: Visit Diagnoses:  1. Exostosis of left foot   2. Arthrosis of left midfoot     Plan: Impression is symptomatic left midfoot posttraumatic arthrosis despite aggressive conservative treatments.  Based on her options she would like to explore surgical treatments.  Based on findings I explained that simply removing the exostoses would likely incompletely relieve her pain as she has a fair amount of posttraumatic arthritis in the midfoot which would need fusion.  Given these findings I recommended referral to Dr. Lucia Gaskins for surgical consultation.  Follow-Up Instructions: No follow-ups on file.   Orders:  Orders Placed This Encounter  Procedures   Ambulatory referral to Orthopedic Surgery   No orders of the defined types were placed in this encounter.     Procedures: No procedures performed   Clinical Data: No additional findings.   Subjective: Chief Complaint  Patient presents with   Left Foot - Pain    HPI  Samantha Smith returns today to discuss left foot pain.  She is interested in doing surgery.  Review of Systems   Objective: Vital Signs: LMP 01/02/2013   Physical Exam  Ortho Exam  Examination left foot is unchanged.  Specialty Comments:  No specialty comments available.  Imaging: No results found.   PMFS History: Patient Active Problem List   Diagnosis Date Noted   Hypothyroidism 02/12/2017   Gastroesophageal reflux disease without esophagitis 02/12/2017   Hyperlipidemia 02/12/2017   PCP NOTES >>>>>>>>>>>>>>>>>>>>>>>>>>>>>>>>>>>>>>> 04/06/2015   Annual physical exam 12/27/2014   Bilateral ankle fractures 06/13/2014   Cervical cancer ([place]) 02/26/2013   Skin lesion 11/10/2012    Positive PPD    Pseudotumor cerebri    Morbid obesity ([place])    Paresthesias 02/06/2011   Vitamin D deficiency 02/03/2007   History of bariatric surgery 02/03/2007   Past Medical History:  Diagnosis Date   Cervical cancer ([place]) 02/2013   Stage IB1 poorly differentiated squamous cell   Diverticulitis    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   Esophageal cancer Neg Hx     Past Surgical History:  Procedure Laterality Date   ABDOMINAL HYSTERECTOMY  2014   CHOLECYSTECTOMY  06/2000   LAPAROSCOPIC GASTRIC BANDING  2008   see OV 06/09/08- ?Malfunction   LAPAROSCOPIC GASTRIC SLEEVE RESECTION  02/20/2021   LAPAROSCOPIC RADICAL TOTAL HYSTERECTOMY W/ NODE BIOPSY, RIGHT SALPINGECTOMY  02/2013   [facility]   ORIF ANKLE FRACTURE Left 06/16/2014   Procedure: OPEN REDUCTION INTERNAL  FIXATION (ORIF) LEFT ANKLE, LISFRANC FRACTURE;  Surgeon: Marianna Payment, MD;  Location: [facility];  Service: Orthopedics;  Laterality: Left;   SALPINGECTOMY Left 2011   ectopic pregnancy   TUBAL LIGATION Right 2005   Social History   Occupational History   Occupation: Optometrist    Tobacco Use   Smoking status: Never   Smokeless tobacco: Never  Vaping Use   Vaping Use: Never used  Substance and Sexual Activity   Alcohol use: Yes    Comment: occ   Drug use: No   Sexual activity: Yes    Partners: Male    Birth control/protection: Surgical    Comment: 1st intercourse 52 yo-More than 5 partners, hysterectomy

## 2022-09-28 DIAGNOSIS — M79672 Pain in left foot: Secondary | ICD-10-CM | POA: Diagnosis not present

## 2022-10-09 DIAGNOSIS — M89372 Hypertrophy of bone, left ankle and foot: Secondary | ICD-10-CM | POA: Diagnosis not present

## 2022-10-29 DIAGNOSIS — R531 Weakness: Secondary | ICD-10-CM | POA: Diagnosis not present

## 2022-10-29 DIAGNOSIS — R262 Difficulty in walking, not elsewhere classified: Secondary | ICD-10-CM | POA: Diagnosis not present

## 2022-11-06 DIAGNOSIS — R262 Difficulty in walking, not elsewhere classified: Secondary | ICD-10-CM | POA: Diagnosis not present

## 2022-11-06 DIAGNOSIS — R531 Weakness: Secondary | ICD-10-CM | POA: Diagnosis not present

## 2022-11-08 DIAGNOSIS — R262 Difficulty in walking, not elsewhere classified: Secondary | ICD-10-CM | POA: Diagnosis not present

## 2022-11-08 DIAGNOSIS — R531 Weakness: Secondary | ICD-10-CM | POA: Diagnosis not present

## 2022-11-12 DIAGNOSIS — R262 Difficulty in walking, not elsewhere classified: Secondary | ICD-10-CM | POA: Diagnosis not present

## 2022-11-12 DIAGNOSIS — R531 Weakness: Secondary | ICD-10-CM | POA: Diagnosis not present

## 2022-11-26 DIAGNOSIS — S63257A Unspecified dislocation of left little finger, initial encounter: Secondary | ICD-10-CM | POA: Diagnosis not present

## 2022-11-26 DIAGNOSIS — W1840XA Slipping, tripping and stumbling without falling, unspecified, initial encounter: Secondary | ICD-10-CM | POA: Diagnosis not present

## 2022-11-26 DIAGNOSIS — S90129A Contusion of unspecified lesser toe(s) without damage to nail, initial encounter: Secondary | ICD-10-CM | POA: Diagnosis not present

## 2022-12-01 DIAGNOSIS — S63257A Unspecified dislocation of left little finger, initial encounter: Secondary | ICD-10-CM | POA: Diagnosis not present

## 2022-12-03 DIAGNOSIS — M79675 Pain in left toe(s): Secondary | ICD-10-CM | POA: Diagnosis not present

## 2022-12-18 DIAGNOSIS — R059 Cough, unspecified: Secondary | ICD-10-CM | POA: Diagnosis not present

## 2022-12-18 DIAGNOSIS — E669 Obesity, unspecified: Secondary | ICD-10-CM | POA: Diagnosis not present

## 2022-12-25 DIAGNOSIS — M79675 Pain in left toe(s): Secondary | ICD-10-CM | POA: Diagnosis not present

## 2023-01-01 ENCOUNTER — Encounter: Payer: BC Managed Care – PPO | Admitting: Internal Medicine

## 2023-01-15 ENCOUNTER — Telehealth: Payer: Self-pay

## 2023-01-15 ENCOUNTER — Ambulatory Visit (INDEPENDENT_AMBULATORY_CARE_PROVIDER_SITE_OTHER): Payer: BC Managed Care – PPO | Admitting: Internal Medicine

## 2023-01-15 ENCOUNTER — Encounter: Payer: Self-pay | Admitting: Internal Medicine

## 2023-01-15 VITALS — BP 126/68 | HR 83 | Temp 98.1°F | Resp 18 | Ht 62.0 in | Wt 263.0 lb

## 2023-01-15 DIAGNOSIS — R234 Changes in skin texture: Secondary | ICD-10-CM

## 2023-01-15 MED ORDER — MUPIROCIN 2 % EX OINT: 1.0000 | TOPICAL_OINTMENT | Freq: Two times a day (BID) | CUTANEOUS | 0 refills | Status: DC

## 2023-01-15 MED ORDER — MUPIROCIN CALCIUM 2 % EX CREA: 1.0000 | TOPICAL_CREAM | Freq: Two times a day (BID) | CUTANEOUS | 0 refills | Status: DC

## 2023-01-15 NOTE — Patient Instructions (Signed)
Apply Bactroban cream (see prescription) twice daily  Apply hydrocortisone 1% over-the-counter twice daily  Call if not gradually better

## 2023-01-15 NOTE — Telephone Encounter (Signed)
Received fax that mupirocin cream not covered by insurance, preferred is ointment- okay to change?

## 2023-01-15 NOTE — Progress Notes (Unsigned)
   Subjective:    Patient ID: Samantha Smith, female    DOB: 09-Oct-1970, 52 y.o.   MRN: [id number]  DOS:  01/15/2023 Type of visit - description: Acute  About 4 weeks ago noted a crusty area @ the left abdomen, it would fell off when she takes a shower and then get crusty again. Does not recall any tick bite, injury, mole or skin lesion there. No fever or chills No discharge No pain.   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   Morbid obesity ([facility])    Positive PPD    s/p abx per patient   Pseudotumor cerebri    STD (sexually transmitted disease)    Chlamydia history   Thyroid disease     Past Surgical History:  Procedure Laterality Date   ABDOMINAL HYSTERECTOMY  2014   CHOLECYSTECTOMY  06/2000   LAPAROSCOPIC GASTRIC BANDING  2008   see OV 06/09/08- ?Malfunction   LAPAROSCOPIC GASTRIC SLEEVE RESECTION  02/20/2021   LAPAROSCOPIC RADICAL TOTAL HYSTERECTOMY W/ NODE BIOPSY, RIGHT SALPINGECTOMY  02/2013   [facility]   ORIF ANKLE FRACTURE Left 06/16/2014   Procedure: OPEN REDUCTION INTERNAL FIXATION (ORIF) LEFT ANKLE, LISFRANC FRACTURE;  Surgeon: Cheral Almas, MD;  Location: [facility];  Service: Orthopedics;  Laterality: Left;   SALPINGECTOMY Left 2011   ectopic pregnancy   TUBAL LIGATION Right 2005    Current Outpatient Medications  Medication Instructions   Multiple Vitamin (MULTIVITAMIN PO) Oral   thyroid (ARMOUR) 60 mg, Oral, Daily   Zepbound 2.5 mg, Subcutaneous, Weekly       Objective:   Physical Exam BP 126/68   Pulse 83   Temp 98.1 F (36.7 C) (Oral)   Resp 18   Ht 5\' 2"  (1.575 m)   Wt 263 lb (119.3 kg)   LMP 01/02/2013   SpO2 97%   BMI 48.10 kg/m  General:   Well developed, NAD, BMI noted. HEENT:  Normocephalic . Face symmetric, atraumatic Skin: See picture from the left abdomen.  Upon  palpation the area is soft, no abscess or mass. Neurologic:  alert & oriented X3.  Speech normal, gait appropriate for age and unassisted Psych--  Cognition and judgment appear intact.  Cooperative with normal attention span and concentration.  Behavior appropriate. No anxious or depressed appearing.      Assessment     ASSESSMENT Hypothyroid + PPD, s/p antibiotics Cervical cancer, laparoscopic radical total hysterectomy 02-2013 H/o STDs, HPV + Pseudotumor Cerebri- dx 2014, see neuro note 11-06-12 (also saw 2 additional neurologist including Dr Kelby Aline 11-22-15) Morbid Obesity:gastric banding ~ 2006 ([place]), removed 06/2018 ([facility]), schedule gastric sleeve 02/2021 H/o vit d def   PLAN: Scab-skin lesion: Seems to be a minor infection, no previous tick bite, no previous mole She  injects Zepbound but never on that area Plan: Bactroban, hydrocortisone twice daily, if no better consider p.o. antibiotics.

## 2023-01-15 NOTE — Telephone Encounter (Signed)
Ointment sent 

## 2023-01-16 NOTE — Assessment & Plan Note (Signed)
Scab-skin lesion: Seems to be a minor infection, no previous tick bite, no previous mole She  injects Zepbound but never on that area Plan: Bactroban, hydrocortisone twice daily, if no better consider p.o. antibiotics.

## 2023-01-18 DIAGNOSIS — M79675 Pain in left toe(s): Secondary | ICD-10-CM | POA: Diagnosis not present

## 2023-02-15 ENCOUNTER — Ambulatory Visit: Payer: BC Managed Care – PPO | Admitting: Internal Medicine

## 2023-02-15 ENCOUNTER — Encounter: Payer: Self-pay | Admitting: Internal Medicine

## 2023-02-15 VITALS — BP 126/68 | HR 64 | Temp 97.8°F | Resp 18 | Ht 62.0 in | Wt 258.5 lb

## 2023-02-15 DIAGNOSIS — E559 Vitamin D deficiency, unspecified: Secondary | ICD-10-CM

## 2023-02-15 DIAGNOSIS — E785 Hyperlipidemia, unspecified: Secondary | ICD-10-CM | POA: Diagnosis not present

## 2023-02-15 DIAGNOSIS — Z0000 Encounter for general adult medical examination without abnormal findings: Secondary | ICD-10-CM | POA: Diagnosis not present

## 2023-02-15 DIAGNOSIS — E039 Hypothyroidism, unspecified: Secondary | ICD-10-CM | POA: Diagnosis not present

## 2023-02-15 LAB — CBC WITH DIFFERENTIAL/PLATELET
Basophils Absolute: 0 10*3/uL (ref 0.0–0.1)
Basophils Relative: 0.5 % (ref 0.0–3.0)
Eosinophils Absolute: 0.1 10*3/uL (ref 0.0–0.7)
Eosinophils Relative: 1.3 % (ref 0.0–5.0)
HCT: 42 % (ref 36.0–46.0)
Hemoglobin: 13.4 g/dL (ref 12.0–15.0)
Lymphocytes Relative: 29.2 % (ref 12.0–46.0)
Lymphs Abs: 2.3 10*3/uL (ref 0.7–4.0)
MCHC: 32 g/dL (ref 30.0–36.0)
MCV: 95.3 fl (ref 78.0–100.0)
Monocytes Absolute: 0.5 10*3/uL (ref 0.1–1.0)
Monocytes Relative: 6 % (ref 3.0–12.0)
Neutro Abs: 5 10*3/uL (ref 1.4–7.7)
Neutrophils Relative %: 63 % (ref 43.0–77.0)
Platelets: 284 10*3/uL (ref 150.0–400.0)
RBC: 4.41 Mil/uL (ref 3.87–5.11)
RDW: 13.9 % (ref 11.5–15.5)
WBC: 7.9 10*3/uL (ref 4.0–10.5)

## 2023-02-15 LAB — COMPREHENSIVE METABOLIC PANEL
ALT: 15 U/L (ref 0–35)
AST: 16 U/L (ref 0–37)
Albumin: 4.2 g/dL (ref 3.5–5.2)
Alkaline Phosphatase: 102 U/L (ref 39–117)
BUN: 14 mg/dL (ref 6–23)
CO2: 28 mEq/L (ref 19–32)
Calcium: 9.5 mg/dL (ref 8.4–10.5)
Chloride: 102 mEq/L (ref 96–112)
Creatinine, Ser: 0.63 mg/dL (ref 0.40–1.20)
GFR: 102.79 mL/min (ref >60.00)
Glucose, Bld: 67 mg/dL — ABNORMAL LOW (ref 70–99)
Potassium: 4.4 mEq/L (ref 3.5–5.1)
Sodium: 140 mEq/L (ref 135–145)
Total Bilirubin: 0.3 mg/dL (ref 0.2–1.2)
Total Protein: 6.9 g/dL (ref 6.0–8.3)

## 2023-02-15 LAB — LIPID PANEL
Cholesterol: 197 mg/dL (ref 0–200)
HDL: 51.7 mg/dL (ref >39.00)
LDL Cholesterol: 108 mg/dL — ABNORMAL HIGH (ref 0–99)
NonHDL: 145.51
Total CHOL/HDL Ratio: 4
Triglycerides: 190 mg/dL — ABNORMAL HIGH (ref 0.0–149.0)
VLDL: 38 mg/dL (ref 0.0–40.0)

## 2023-02-15 LAB — VITAMIN D 25 HYDROXY (VIT D DEFICIENCY, FRACTURES): VITD: 20.37 ng/mL — ABNORMAL LOW (ref 30.00–100.00)

## 2023-02-15 LAB — MAGNESIUM: Magnesium: 2.1 mg/dL (ref 1.5–2.5)

## 2023-02-15 LAB — TSH: TSH: 1.31 u[IU]/mL (ref 0.35–5.50)

## 2023-02-15 NOTE — Progress Notes (Unsigned)
Subjective:    Patient ID: Samantha Smith, female    DOB: [birth year], 52 y.o.   MRN: [id number]  DOS:  02/15/2023 Type of visit - description: cpx Here for CPX In general feels well.  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   Morbid obesity (HCC)    Positive PPD    s/p abx per patient   Pseudotumor cerebri    STD (sexually transmitted disease)    Chlamydia history   Thyroid disease     Past Surgical History:  Procedure Laterality Date   ABDOMINAL HYSTERECTOMY  2014   CHOLECYSTECTOMY  06/2000   LAPAROSCOPIC GASTRIC BANDING  2008   see OV 06/09/08- ?Malfunction   LAPAROSCOPIC GASTRIC SLEEVE RESECTION  02/20/2021   LAPAROSCOPIC RADICAL TOTAL HYSTERECTOMY W/ NODE BIOPSY, RIGHT SALPINGECTOMY  02/2013   [facility]   ORIF ANKLE FRACTURE Left 06/16/2014   Procedure: OPEN REDUCTION INTERNAL FIXATION (ORIF) LEFT ANKLE, LISFRANC FRACTURE;  Surgeon: Cheral Almas, MD;  Location: [facility] OR;  Service: Orthopedics;  Laterality: Left;   SALPINGECTOMY Left 2011   ectopic pregnancy   TUBAL LIGATION Right 2005     Social History   Social History Narrative   Lives w/ husband     Current Outpatient Medications  Medication Instructions   Multiple Vitamin (MULTIVITAMIN PO) Oral   mupirocin ointment (BACTROBAN) 2 % 1 Application, Topical, 2 times daily   thyroid (ARMOUR) 60 mg, Oral, Daily   Zepbound 2.5 mg, Subcutaneous, Weekly       Objective:   Physical Exam BP 126/68   Pulse 64   Temp 97.8 F (36.6 C) (Oral)   Resp 18   Ht 5\' 2"  (1.575 m)   Wt 258 lb 8 oz (117.3 kg)   LMP 01/02/2013   SpO2 96%   BMI 47.28 kg/m  General: Well developed, NAD, BMI  noted Neck: No  thyromegaly  HEENT:  Normocephalic . Face symmetric, atraumatic Lungs:  CTA B Normal respiratory effort, no intercostal retractions, no accessory muscle use. Heart: RRR,  no murmur.  Abdomen: See LOV, area where the scab was is now the same size, skin is slightly darker. Neurologic:  alert & oriented X3.  Speech normal, gait appropriate for age and unassisted Strength symmetric and appropriate for age.  Psych: Cognition and judgment appear intact.  Cooperative with normal attention span and concentration.  Behavior appropriate. No anxious or depressed appearing.     Assessment     ASSESSMENT Hypothyroid + PPD, s/p antibiotics Cervical cancer, laparoscopic radical total hysterectomy 02-2013 H/o STDs, HPV + Pseudotumor Cerebri- dx 2014, see neuro note 11-06-12 (also saw 2 additional neurologist including Dr Kelby Aline 11-22-15) Morbid Obesity:gastric banding ~ 2006 ([place]), removed 06/2018 ([facility]), gastric sleeve 02/2021 H/o vit d def   PLAN: Here for CPX -Td 2019 - PNM 23 : 2016 - s/p shingrix x2 -Recommend COVID booster and a flu shot this fall. -Female care:  per gynecology, personal h/o cervical cancer + FH breast ca: MMG 07/2022 per [facility]. -CCS: No Cscope, h/o diverticulitis, multiple  referrals to GI failed  -Labs: CMP FLP CBC  TSH vitamin D magnesium. -Diet and exercise: Seen elsewhere for weight management, nevertheless we talk about a healthy diet.  Exercise gradually. - Healthcare POA discussed Scab-skin lesion: See LOV, few days after the visit she saw discharge coming out, that is stopped, on exam today she has post inflammatory hyperpigmentation. Hypothyroidism: On meds, prescribed elsewhere Morbid obesity: On Zepbound prescribed elsewhere. RTC 1 year

## 2023-02-15 NOTE — Patient Instructions (Addendum)
Vaccines I recommend: Covid booster Flu shot this fall   GO TO THE LAB : Get the blood work     GO TO THE FRONT DESK, PLEASE SCHEDULE YOUR APPOINTMENTS Come back for   a physical exam in 1 year    "Health Care Power of attorney" ,  "Living will" (Advance care planning documents)  If you already have a living will or healthcare power of attorney, is recommended you bring the copy to be scanned in your chart.   The document will be available to all the doctors you see in the system.  Advance care planning is a process that supports adults in  understanding and sharing their preferences regarding future medical care.  The patient's preferences are recorded in documents called Advance Directives and the can be modified at any time while the patient is in full mental capacity.   If you don't have one, please consider create one.      More information at: [email or web address]

## 2023-02-17 ENCOUNTER — Encounter: Payer: Self-pay | Admitting: Internal Medicine

## 2023-02-17 NOTE — Assessment & Plan Note (Signed)
Here for CPX -Td 2019 - PNM 23 : 2016 - s/p shingrix x2 -Recommend COVID booster and a flu shot this fall. -Female care:  per gynecology, personal h/o cervical cancer + FH breast ca: MMG 07/2022 per [facility]. -CCS: No Cscope, h/o diverticulitis, multiple  referrals to GI failed  -Labs: CMP FLP CBC TSH vitamin D magnesium. -Diet and exercise: Seen elsewhere for weight management, nevertheless we talk about a healthy diet.  Exercise gradually. - Healthcare POA discussed

## 2023-02-17 NOTE — Assessment & Plan Note (Signed)
Here for CPX Scab-skin lesion: See LOV, few days after the visit she saw discharge coming out, that is stopped, on exam today she has post inflammatory hyperpigmentation. Hypothyroidism: On meds, prescribed elsewhere Morbid obesity: On Zepbound prescribed elsewhere. RTC 1 year

## 2023-02-18 MED ORDER — VITAMIN D (ERGOCALCIFEROL) 1.25 MG (50000 UNIT) PO CAPS: 50000.0000 [IU] | ORAL_CAPSULE | ORAL | 0 refills | Status: AC

## 2023-02-18 NOTE — Addendum Note (Signed)
Addended byConrad  D on: 02/18/2023 04:09 PM   Modules accepted: Orders

## 2023-04-09 DIAGNOSIS — R21 Rash and other nonspecific skin eruption: Secondary | ICD-10-CM | POA: Diagnosis not present

## 2023-04-09 DIAGNOSIS — E669 Obesity, unspecified: Secondary | ICD-10-CM | POA: Diagnosis not present

## 2023-04-26 ENCOUNTER — Other Ambulatory Visit: Payer: Self-pay | Admitting: Internal Medicine

## 2023-04-26 DIAGNOSIS — Z1212 Encounter for screening for malignant neoplasm of rectum: Secondary | ICD-10-CM

## 2023-04-26 DIAGNOSIS — Z1211 Encounter for screening for malignant neoplasm of colon: Secondary | ICD-10-CM

## 2023-05-15 ENCOUNTER — Other Ambulatory Visit: Payer: Self-pay | Admitting: Internal Medicine

## 2023-05-15 ENCOUNTER — Ambulatory Visit
Admission: RE | Admit: 2023-05-15 | Discharge: 2023-05-15 | Disposition: A | Discharge status: Home / Self Care | Payer: BC Managed Care – PPO | Source: Ambulatory Visit | Attending: Internal Medicine | Admitting: Internal Medicine

## 2023-05-15 DIAGNOSIS — R109 Unspecified abdominal pain: Secondary | ICD-10-CM

## 2023-05-15 DIAGNOSIS — K5900 Constipation, unspecified: Secondary | ICD-10-CM | POA: Diagnosis not present

## 2023-07-29 DIAGNOSIS — R7309 Other abnormal glucose: Secondary | ICD-10-CM | POA: Diagnosis not present

## 2023-07-29 DIAGNOSIS — E559 Vitamin D deficiency, unspecified: Secondary | ICD-10-CM | POA: Diagnosis not present

## 2023-07-29 DIAGNOSIS — E039 Hypothyroidism, unspecified: Secondary | ICD-10-CM | POA: Diagnosis not present

## 2023-07-29 DIAGNOSIS — E785 Hyperlipidemia, unspecified: Secondary | ICD-10-CM | POA: Diagnosis not present

## 2023-07-29 LAB — HEPATIC FUNCTION PANEL
ALT: 22 U/L (ref 7–35)
AST: 21 (ref 13–35)
Alkaline Phosphatase: 121 (ref 25–125)
Bilirubin, Total: 0.3

## 2023-07-29 LAB — CBC AND DIFFERENTIAL
HCT: 40 (ref 36–46)
Hemoglobin: 12.9 (ref 12.0–16.0)
Platelets: 279 10*3/uL (ref 150–400)
WBC: 7.4

## 2023-07-29 LAB — BASIC METABOLIC PANEL
BUN: 11 (ref 4–21)
CO2: 19 (ref 13–22)
Chloride: 104 (ref 99–108)
Creatinine: 0.5 (ref 0.5–1.1)
Glucose: 95
Potassium: 4.5 meq/L (ref 3.5–5.1)
Sodium: 142 (ref 137–147)

## 2023-07-29 LAB — COMPREHENSIVE METABOLIC PANEL
Albumin: 4.1 (ref 3.5–5.0)
Calcium: 9 (ref 8.7–10.7)
Globulin: 2.4
eGFR: 114

## 2023-07-29 LAB — LIPID PANEL
Cholesterol: 198 (ref 0–200)
HDL: 58 (ref 35–70)
LDL Cholesterol: 114
Triglycerides: 148 (ref 40–160)

## 2023-07-29 LAB — HEMOGLOBIN A1C: Hemoglobin A1C: 5.4

## 2023-07-29 LAB — CBC: RBC: 4.18 (ref 3.87–5.11)

## 2023-07-29 LAB — TSH: TSH: 4.01 (ref 0.41–5.90)

## 2023-07-29 LAB — VITAMIN D 25 HYDROXY (VIT D DEFICIENCY, FRACTURES): Vit D, 25-Hydroxy: 23

## 2023-08-08 DIAGNOSIS — E559 Vitamin D deficiency, unspecified: Secondary | ICD-10-CM | POA: Diagnosis not present

## 2023-08-08 DIAGNOSIS — E039 Hypothyroidism, unspecified: Secondary | ICD-10-CM | POA: Diagnosis not present

## 2023-08-08 DIAGNOSIS — Z0001 Encounter for general adult medical examination with abnormal findings: Secondary | ICD-10-CM | POA: Diagnosis not present

## 2023-08-08 DIAGNOSIS — E785 Hyperlipidemia, unspecified: Secondary | ICD-10-CM | POA: Diagnosis not present

## 2023-09-09 ENCOUNTER — Other Ambulatory Visit: Payer: Self-pay | Admitting: Internal Medicine

## 2023-09-09 DIAGNOSIS — Z0000 Encounter for general adult medical examination without abnormal findings: Secondary | ICD-10-CM

## 2023-09-26 ENCOUNTER — Ambulatory Visit
Admission: RE | Admit: 2023-09-26 | Discharge: 2023-09-26 | Disposition: A | Discharge status: Home / Self Care | Source: Ambulatory Visit | Attending: Internal Medicine | Admitting: Internal Medicine

## 2023-09-26 DIAGNOSIS — Z0000 Encounter for general adult medical examination without abnormal findings: Secondary | ICD-10-CM

## 2023-09-26 DIAGNOSIS — Z1231 Encounter for screening mammogram for malignant neoplasm of breast: Secondary | ICD-10-CM | POA: Diagnosis not present

## 2023-10-02 ENCOUNTER — Encounter: Payer: Self-pay | Admitting: Internal Medicine

## 2023-10-16 ENCOUNTER — Encounter: Payer: Self-pay | Admitting: Nurse Practitioner

## 2023-10-16 ENCOUNTER — Ambulatory Visit (INDEPENDENT_AMBULATORY_CARE_PROVIDER_SITE_OTHER): Payer: BC Managed Care – PPO | Admitting: Nurse Practitioner

## 2023-10-16 VITALS — BP 130/84 | HR 84 | Ht 62.5 in | Wt 274.0 lb

## 2023-10-16 DIAGNOSIS — Z01419 Encounter for gynecological examination (general) (routine) without abnormal findings: Secondary | ICD-10-CM

## 2023-10-16 DIAGNOSIS — Z8541 Personal history of malignant neoplasm of cervix uteri: Secondary | ICD-10-CM | POA: Diagnosis not present

## 2023-10-16 DIAGNOSIS — Z1331 Encounter for screening for depression: Secondary | ICD-10-CM | POA: Diagnosis not present

## 2023-10-16 NOTE — Patient Instructions (Signed)
 Schedule Colonoscopy! ?[facility] ?[phone] ?[address] [place], [place] [address] ? ?

## 2023-10-16 NOTE — Progress Notes (Signed)
 Samantha Smith 05/26/1971 [id number]   History:  53 y.o. G3P0030 presents for annual exam. History of stage IB1 cervical cancer status post robotic radical hysterectomy with lymph node dissection in 2014 at [facility]. Has started having occasional hot flashes. Hypothyroidism managed by PCP.   Gynecologic History Patient's last menstrual period was 01/02/2013.   Contraception/Family planning: status post hysterectomy Sexually active: Yes  Health Maintenance Last Pap: 08/01/2022 (vaginal). Results were: Normal, 3-year repeat Last mammogram: 09/26/2023. Results were: Normal Last colonoscopy: Never Last Dexa: Never     10/16/2023   10:20 AM  Depression screen PHQ 2/9  Decreased Interest 0  Down, Depressed, Hopeless 0  PHQ - 2 Score 0     Past medical history, past surgical history, family history and social history were all reviewed and documented in the EPIC chart. Married. Accountant. Mother diagnosed with breast cancer at age 52.   ROS:  A ROS was performed and pertinent positives and negatives are included.  Exam:  Vitals:   10/16/23 1018  BP: 130/84  Pulse: 84  SpO2: 97%  Weight: 274 lb (124.3 kg)  Height: 5' 2.5" (1.588 m)   Body mass index is 49.32 kg/m.   General appearance:  Normal Thyroid:  Symmetrical, normal in size, without palpable masses or nodularity. Respiratory  Auscultation:  Clear without wheezing or rhonchi Cardiovascular  Auscultation:  Regular rate, without rubs, murmurs or gallops  Edema/varicosities:  Not grossly evident Abdominal  Soft,nontender, without masses, guarding or rebound.  Liver/spleen:  No organomegaly noted  Hernia:  None appreciated  Skin  Inspection:  Grossly normal Breasts: Examined lying and sitting.   Right: Without masses, retractions, nipple discharge or axillary adenopathy.   Left: Without masses, retractions, nipple discharge or axillary adenopathy. Pelvic: External genitalia:  no lesions              Urethra:  normal  appearing urethra with no masses, tenderness or lesions              Bartholins and Skenes: normal                 Vagina: normal appearing vagina with normal color and discharge, no lesions              Cervix: absent Bimanual Exam:  Uterus: absent              Adnexa: no mass, fullness, tenderness              Rectovaginal: Deferred              Anus:  normal, no lesions   Patient informed chaperone available to be present for breast and pelvic exam. Patient has requested no chaperone to be present. Patient has been advised what will be completed during breast and pelvic exam.   Assessment/Plan:  53 y.o. G3P0030 for annual exam.   Well female exam with routine gynecological exam - Education provided on SBEs, importance of preventative screenings, current guidelines, high calcium diet, regular exercise, and multivitamin daily. Labs with PCP.   History of cervical cancer - History of stage IB1 cervical cancer status post robotic radical hysterectomy with lymph node dissection in 2014 at [facility]. Vaginal pap at 3-year interval per guidelines.   Screening for breast cancer - Normal mammogram history.  Continue annual screenings.  Normal breast exam today.  Screening for colon cancer - Has not had screening colonoscopy. Cologuard provided by PCP but patient has decided to do colonoscopy. Information provided on  [facility].   Return in about 1 year (around 10/15/2024) for Annual.     Olivia Mackie DNP, 10:50 AM 10/16/2023

## 2024-01-27 DIAGNOSIS — M25572 Pain in left ankle and joints of left foot: Secondary | ICD-10-CM | POA: Diagnosis not present

## 2024-02-03 ENCOUNTER — Other Ambulatory Visit: Payer: Self-pay | Admitting: Orthopaedic Surgery

## 2024-02-03 DIAGNOSIS — M25572 Pain in left ankle and joints of left foot: Secondary | ICD-10-CM

## 2024-02-16 ENCOUNTER — Ambulatory Visit
Admission: RE | Admit: 2024-02-16 | Discharge: 2024-02-16 | Disposition: A | Discharge status: Home / Self Care | Source: Ambulatory Visit | Attending: Orthopaedic Surgery

## 2024-02-16 DIAGNOSIS — M25572 Pain in left ankle and joints of left foot: Secondary | ICD-10-CM | POA: Diagnosis not present

## 2024-02-17 ENCOUNTER — Encounter: Payer: BC Managed Care – PPO | Admitting: Internal Medicine

## 2024-02-21 DIAGNOSIS — M25572 Pain in left ankle and joints of left foot: Secondary | ICD-10-CM | POA: Diagnosis not present

## 2024-03-17 ENCOUNTER — Encounter: Payer: Self-pay | Admitting: Internal Medicine

## 2024-03-17 ENCOUNTER — Encounter: Admitting: Internal Medicine

## 2024-08-19 ENCOUNTER — Other Ambulatory Visit: Payer: Self-pay | Admitting: Internal Medicine

## 2024-08-19 DIAGNOSIS — R1032 Left lower quadrant pain: Secondary | ICD-10-CM

## 2024-08-27 ENCOUNTER — Other Ambulatory Visit: Payer: Self-pay | Admitting: Internal Medicine

## 2024-08-27 ENCOUNTER — Inpatient Hospital Stay
Admission: RE | Admit: 2024-08-27 | Discharge: 2024-08-27 | Disposition: A | Source: Ambulatory Visit | Attending: Internal Medicine | Admitting: Internal Medicine

## 2024-08-27 DIAGNOSIS — R1032 Left lower quadrant pain: Secondary | ICD-10-CM

## 2024-08-27 MED ORDER — IOPAMIDOL (ISOVUE-300) INJECTION 61%
100.0000 mL | Freq: Once | INTRAVENOUS | Status: AC | PRN
  Administered 2024-08-27: 100 mL via INTRAVENOUS

## 2024-08-28 ENCOUNTER — Encounter: Payer: Self-pay | Admitting: Internal Medicine

## 2024-09-17 ENCOUNTER — Encounter

## 2024-10-01 ENCOUNTER — Encounter: Admitting: Internal Medicine

## 2024-10-19 ENCOUNTER — Ambulatory Visit: Admitting: Nurse Practitioner

## 2024-10-20 ENCOUNTER — Ambulatory Visit: Admitting: Nurse Practitioner
